# Patient Record
Sex: Male | Born: 1950 | Race: Black or African American | Hispanic: No | Marital: Married | Smoking: Never smoker
Health system: Southern US, Community
[De-identification: ages and names within clinical notes are randomized; demographics above are authoritative.]

## PROBLEM LIST (undated history)

## (undated) DIAGNOSIS — I10 Essential (primary) hypertension: Secondary | ICD-10-CM

## (undated) DIAGNOSIS — C801 Malignant (primary) neoplasm, unspecified: Secondary | ICD-10-CM

## (undated) HISTORY — PX: PROSTATE SURGERY: SHX751

## (undated) HISTORY — PX: COLONOSCOPY: SHX174

## (undated) HISTORY — DX: Malignant (primary) neoplasm, unspecified: C80.1

## (undated) HISTORY — PX: ROTATOR CUFF REPAIR: SHX139

## (undated) HISTORY — PX: KNEE SURGERY: SHX244

## (undated) HISTORY — DX: Essential (primary) hypertension: I10

---

## 1997-08-06 ENCOUNTER — Other Ambulatory Visit: Admission: RE | Admit: 1997-08-06 | Discharge: 1997-08-06 | Payer: Self-pay | Admitting: Internal Medicine

## 1999-10-14 ENCOUNTER — Encounter: Admission: RE | Admit: 1999-10-14 | Discharge: 1999-10-14 | Payer: Self-pay | Admitting: Urology

## 1999-10-14 ENCOUNTER — Encounter: Payer: Self-pay | Admitting: Urology

## 1999-10-27 ENCOUNTER — Encounter: Payer: Self-pay | Admitting: Urology

## 1999-10-27 ENCOUNTER — Encounter: Admission: RE | Admit: 1999-10-27 | Discharge: 1999-10-27 | Payer: Self-pay | Admitting: Urology

## 1999-11-06 ENCOUNTER — Encounter: Payer: Self-pay | Admitting: Urology

## 1999-11-06 ENCOUNTER — Encounter: Admission: RE | Admit: 1999-11-06 | Discharge: 1999-11-06 | Payer: Self-pay | Admitting: Urology

## 2000-07-22 ENCOUNTER — Encounter: Payer: Self-pay | Admitting: Urology

## 2000-07-26 ENCOUNTER — Ambulatory Visit (HOSPITAL_COMMUNITY): Admission: RE | Admit: 2000-07-26 | Discharge: 2000-07-26 | Payer: Self-pay | Admitting: Urology

## 2003-08-30 ENCOUNTER — Emergency Department (HOSPITAL_COMMUNITY): Admission: EM | Admit: 2003-08-30 | Discharge: 2003-08-30 | Payer: Self-pay | Admitting: Family Medicine

## 2004-06-05 ENCOUNTER — Observation Stay (HOSPITAL_COMMUNITY): Admission: AD | Admit: 2004-06-05 | Discharge: 2004-06-05 | Payer: Self-pay | Admitting: Specialist

## 2004-06-05 ENCOUNTER — Ambulatory Visit (HOSPITAL_BASED_OUTPATIENT_CLINIC_OR_DEPARTMENT_OTHER): Admission: RE | Admit: 2004-06-05 | Discharge: 2004-06-05 | Payer: Self-pay | Admitting: Specialist

## 2005-03-03 ENCOUNTER — Ambulatory Visit (HOSPITAL_COMMUNITY): Admission: RE | Admit: 2005-03-03 | Discharge: 2005-03-03 | Payer: Self-pay | Admitting: Orthopedic Surgery

## 2005-03-03 ENCOUNTER — Ambulatory Visit (HOSPITAL_BASED_OUTPATIENT_CLINIC_OR_DEPARTMENT_OTHER): Admission: RE | Admit: 2005-03-03 | Discharge: 2005-03-03 | Payer: Self-pay | Admitting: Orthopedic Surgery

## 2008-05-16 ENCOUNTER — Encounter: Payer: Self-pay | Admitting: Gastroenterology

## 2008-06-18 ENCOUNTER — Ambulatory Visit: Payer: Self-pay | Admitting: Gastroenterology

## 2008-06-27 ENCOUNTER — Ambulatory Visit: Payer: Self-pay | Admitting: Gastroenterology

## 2008-06-27 LAB — HM COLONOSCOPY

## 2008-07-02 ENCOUNTER — Telehealth: Payer: Self-pay | Admitting: Gastroenterology

## 2009-06-17 ENCOUNTER — Inpatient Hospital Stay (HOSPITAL_COMMUNITY): Admission: RE | Admit: 2009-06-17 | Discharge: 2009-06-18 | Payer: Self-pay | Admitting: Urology

## 2009-06-17 ENCOUNTER — Encounter (INDEPENDENT_AMBULATORY_CARE_PROVIDER_SITE_OTHER): Payer: Self-pay | Admitting: Urology

## 2010-04-20 ENCOUNTER — Encounter: Payer: Self-pay | Admitting: Urology

## 2010-04-24 ENCOUNTER — Encounter
Admission: RE | Admit: 2010-04-24 | Discharge: 2010-04-29 | Payer: Self-pay | Source: Home / Self Care | Attending: Internal Medicine | Admitting: Internal Medicine

## 2010-06-23 LAB — COMPREHENSIVE METABOLIC PANEL
ALT: 26 U/L (ref 0–53)
AST: 22 U/L (ref 0–37)
Albumin: 4 g/dL (ref 3.5–5.2)
Alkaline Phosphatase: 61 U/L (ref 39–117)
BUN: 12 mg/dL (ref 6–23)
CO2: 31 mEq/L (ref 19–32)
Calcium: 9.7 mg/dL (ref 8.4–10.5)
Chloride: 104 mEq/L (ref 96–112)
Creatinine, Ser: 0.95 mg/dL (ref 0.4–1.5)
GFR calc Af Amer: >60 mL/min (ref >60)
GFR calc non Af Amer: >60 mL/min (ref >60)
Glucose, Bld: 100 mg/dL — ABNORMAL HIGH (ref 70–99)
Potassium: 3.8 mEq/L (ref 3.5–5.1)
Sodium: 141 mEq/L (ref 135–145)
Total Bilirubin: 0.7 mg/dL (ref 0.3–1.2)
Total Protein: 7.2 g/dL (ref 6.0–8.3)

## 2010-06-23 LAB — CBC
HCT: 42.4 % (ref 39.0–52.0)
Hemoglobin: 14.3 g/dL (ref 13.0–17.0)
MCHC: 33.4 g/dL (ref 30.0–36.0)
MCHC: 33.7 g/dL (ref 30.0–36.0)
MCV: 85.4 fL (ref 78.0–100.0)
Platelets: 204 10*3/uL (ref 150–400)
RBC: 4.2 MIL/uL — ABNORMAL LOW (ref 4.22–5.81)
RBC: 4.97 MIL/uL (ref 4.22–5.81)
RDW: 14.1 % (ref 11.5–15.5)
WBC: 6.2 10*3/uL (ref 4.0–10.5)

## 2010-06-23 LAB — ABO/RH: ABO/RH(D): O POS

## 2010-06-23 LAB — HEMOGLOBIN AND HEMATOCRIT, BLOOD: HCT: 36.7 % — ABNORMAL LOW (ref 39.0–52.0)

## 2010-06-23 LAB — DIFFERENTIAL
Basophils Absolute: 0 10*3/uL (ref 0.0–0.1)
Basophils Relative: 0 % (ref 0–1)
Monocytes Relative: 8 % (ref 3–12)
Neutro Abs: 6.8 10*3/uL (ref 1.7–7.7)
Neutrophils Relative %: 76 % (ref 43–77)

## 2010-06-23 LAB — BASIC METABOLIC PANEL
CO2: 28 mEq/L (ref 19–32)
Calcium: 8.5 mg/dL (ref 8.4–10.5)
Creatinine, Ser: 0.73 mg/dL (ref 0.4–1.5)
GFR calc Af Amer: >60 mL/min (ref >60)

## 2010-06-23 LAB — TYPE AND SCREEN

## 2010-08-15 NOTE — Op Note (Signed)
NAMEDEVONTAYE, GROUND               ACCOUNT NO.:  [id number]   MEDICAL RECORD NO.:  [id number]          PATIENT TYPE:  AMB   LOCATION:  DSC                          FACILITY:  [facility]   PHYSICIAN:  Katy Fitch. Sypher, M.D. 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   POSTOPERATIVE DIAGNOSES:  1.  Degenerative labral tear anteriorly and superiorly with degeneration of      the anterior inferior glenoid.  2.  Residual inferior clavicle impingement with prominent anterior inferior      clavicle osteophyte status post prior open resection.   OPERATION:  1.  Diagnostic arthroscopy left glenohumeral joint with debridement of      degenerative labrum and degenerative hyaline articular cartilage of      glenoid anteriorly and inferiorly.  2.  Identification of deep surface partial-thickness supraspinatus rotator      cuff tear that was debrided to bleeding tendon surface.  3.  Subacromial decompression with coracoacromial ligament release,      acromioplasty and bursectomy.  4.  Open resection of distal clavicle with repair of deltoid and trapezius      muscles.   OPERATING SURGEON:  Katy Fitch. Sypher, M.D.   ASSISTANT:  Molly Maduro Dasnoit PA-C.   ANESTHESIA:  General endotracheal supplemented by left interscalene block.   SUPERVISING ANESTHESIOLOGIST:  Dr. Gelene Mink.   INDICATION:  Keldrick Pomplun is a 60 year old gentleman employed by [employer].   He has a history of developing left shoulder pain.   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   After a failed course of therapy and a failure to respond to nonoperative  measures, he was brought to the  operating room and had his distal left  clavicle and medial acromion resected by an orthopedic colleague.   After approximately six months of rehabilitation, he still had residual left  shoulder pain. Therefore a independent medical evaluation was requested.   Clinical examination at that time revealed signs of residual rotator cuff  disease with pain on resisted abduction and external rotation and signs of  residual anterior impingement.   Plain films suggested that there was an anterior osteophyte of the clavicle  inferiorly and there was still a rather prominent anterior lateral acromion.   An MRI the shoulder was reviewed which revealed signs of rotator cuff  tendinopathy, an indistinct the anterior labrum and prominent inferior  clavicle.   We recommended to Mr. Primmer that he strongly consider arthroscopic  evaluation of his shoulder to discern the degree of glenohumeral pathology  and to review his labrum, biceps tendon and rotator cuff.   In addition, we recommended subacromial decompression and distal clavicle  resection as well as repair of the rotator cuff if a significant tear was  identified.   After deliberation and a lengthy informed  consent, Mr. Bacallao proceeds with  surgery at this time.   PROCEDURE:  Toren Tucholski is brought to the operating room and placed in  supine position upon the table.   Dr. Gelene Mink had completed an anesthesia consultation in the holding area  including a contemporary EKG.   Dr. Gelene Mink advised Mr. Scarano to proceed with an interscalene block. This  was placed without complication followed by induction of general anesthesia.  Mr. Zinni was carefully positioned in the beach-chair position with the aid  of a torso and head holder designed for shoulder arthroscopy.   The entire left upper extremity and forequarter were prepped with DuraPrep  and draped with impervious arthroscopy drapes.   Diagnostic arthroscopy revealed intact  hyaline articular cartilage on the  humeral head in 90% of the load-bearing surface. There was grade II  chondromalacia noted superiorly adjacent to a partial-thickness rotator cuff  tear involving the entire supraspinatus. The biceps anchor was stable at the  glenoid and the biceps tendon was normal through the rotator interval. There  was a degenerative tear of the superior and anterior labrum as well as the  anterior inferior labrum. Anterior portal was created and a nerve hook was  used to palpate the anterior glenohumeral ligaments. The superior middle and  inferior glenohumeral ligaments were noted to be sound. However, there was  grade III chondromalacia on the anterior inferior glenoid. The inferior  labrum was degenerative, the posterior labrum was intact.   The anterior portal was created under direct vision for placement of a  suction shaver. The deep surface of the supraspinatus tendon was debrided of  all degenerative fibers until bleeding tendon was identified. It is my  estimate that his deep surface tear was less than 25% the thickness of the  tendon, therefore open repair in my judgment was not indicated in a 60-year-  old man.   The labrum was debrided through a stable margin and photographic  documentation completed. The arthroscope was removed from the glenohumeral  joint and placed in the subacromial space.   The bursa was noted to be hypertrophic with a reformed coracoacromial  ligament. The anterior lateral acromion was prominent. There were  fibrillation changes in the surface of the supraspinatus tendon due to  residual impingement. There was scar obliterating the site of the [facility]  resection.   After photographic documentation of the bursal pathology, the scope was  removed and we proceeded to resection of approximate 15 more mm of the  distal clavicle through a removal of the prior surgical scar, extended in an L-shaped manner towards the midline with  identification of the distal  clavicle, subperiosteal dissection and after careful identification of  landmarks resection of an additional 11-12 mm creating a total resection of  approximately 16 mm.   Care was taken to palpate the inferior surface. This is still a prominent  inferior clavicle. Therefore, a 45 degree angle oblique resection of  clavicle was completed, taking care to identify and preserve the  coracoclavicular ligaments.   This appeared to adequately decompress the supraspinatus tendon.   The anterior deltoid and trapezial muscle fibers were then gathered with two  figure-of-eight sutures of #2 FiberWire closing the dead space created by  distal clavicle resection followed by replacement of the scope in the  subacromial space followed by bursectomy release of the coracoacromial  ligament with the cutting cautery and leveling of the acromion to a type I  morphology with particular attention to the medial [facility]  joint posteriorly and  the anterior lateral lip.   The rotator cuff was debrided of all of the fibrillated zones and appeared  to be otherwise intact.   After hemostasis was achieved, the arthroscopic equipment was removed and  the portals repaired with mattress suture of 3-0 Prolene.   The skin at the site of distal clavicle resection was repaired with  subdermal sutures of 2-0 Vicryl and intradermal 3-0 Prolene with Steri-  Strips.   Mr. Yankee had an excellent interscalene block preoperatively. Marcaine was  not placed in the portals.   He was awakened from general anesthesia, placed in a sling and transferred  to the recovery room with stable vital signs.   We anticipate admission to the recovery care center for observation of his  vital signs and perioperative antibiotics. However, if his pain as well  controlled and his vital signs were stable, specifically is O2 sats are  satisfactory after a interscalene block, we may allow him early discharge  this  afternoon.      Katy Fitch Sypher, M.D.  Electronically Signed     RVS/MEDQ  D:  03/03/2005  T:  03/03/2005  Job:  [id number]

## 2011-09-24 ENCOUNTER — Other Ambulatory Visit: Payer: Self-pay | Admitting: Internal Medicine

## 2011-10-08 ENCOUNTER — Other Ambulatory Visit: Payer: Self-pay | Admitting: Internal Medicine

## 2011-10-08 ENCOUNTER — Ambulatory Visit: Payer: Self-pay | Admitting: Internal Medicine

## 2011-10-08 VITALS — BP 172/110 | HR 66 | Temp 99.0°F | Resp 16 | Ht 70.5 in | Wt 234.8 lb

## 2011-10-08 DIAGNOSIS — G8921 Chronic pain due to trauma: Secondary | ICD-10-CM | POA: Insufficient documentation

## 2011-10-08 DIAGNOSIS — E559 Vitamin D deficiency, unspecified: Secondary | ICD-10-CM | POA: Insufficient documentation

## 2011-10-08 DIAGNOSIS — N529 Male erectile dysfunction, unspecified: Secondary | ICD-10-CM

## 2011-10-08 DIAGNOSIS — I10 Essential (primary) hypertension: Secondary | ICD-10-CM | POA: Insufficient documentation

## 2011-10-08 LAB — POCT CBC
HCT, POC: 47.5 % (ref 43.5–53.7)
Lymph, poc: 1.7 (ref 0.6–3.4)
MCH, POC: 27.4 pg (ref 27–31.2)
MCHC: 31.6 g/dL — AB (ref 31.8–35.4)
MCV: 86.9 fL (ref 80–97)
POC Granulocyte: 3.6 (ref 2–6.9)
POC LYMPH PERCENT: 29.4 %L (ref 10–50)
RDW, POC: 14.6 %
WBC: 5.7 10*3/uL (ref 4.6–10.2)

## 2011-10-08 LAB — COMPREHENSIVE METABOLIC PANEL
Alkaline Phosphatase: 63 U/L (ref 39–117)
BUN: 11 mg/dL (ref 6–23)
Creat: 0.92 mg/dL (ref 0.50–1.35)
Glucose, Bld: 96 mg/dL (ref 70–99)
Total Bilirubin: 0.5 mg/dL (ref 0.3–1.2)

## 2011-10-08 MED ORDER — SILDENAFIL CITRATE 100 MG PO TABS
100.0000 mg | ORAL_TABLET | Freq: Every day | ORAL | Status: DC | PRN
Start: 2011-10-08 — End: 2012-01-05

## 2011-10-08 MED ORDER — LISINOPRIL-HYDROCHLOROTHIAZIDE 10-12.5 MG PO TABS: 1.0000 | ORAL_TABLET | Freq: Every day | ORAL | Status: DC

## 2011-10-08 MED ORDER — AMLODIPINE BESYLATE 10 MG PO TABS: 10.0000 mg | ORAL_TABLET | Freq: Every day | ORAL | Status: DC

## 2011-10-08 NOTE — Progress Notes (Signed)
  Subjective:    Patient ID: Jason Valencia, male    DOB: 07-01-1950, 61 y.o.   MRN: [id number]  HPI Out of meds Feels fine except for lots of orthopedic pain and using nsaids daily. No smoke and exercises regularly. Needs vit d level cked   Review of Systems same    Objective:   Physical Exam Normal except bp 169/99 168/98  Off meds.  labs Results for orders placed in visit on 10/08/11  POCT CBC      Component Value Range   WBC 5.7  4.6 - 10.2 K/uL   Lymph, poc 1.7  0.6 - 3.4   POC LYMPH PERCENT 29.4  10 - 50 %L   MID (cbc) 0.4  0 - 0.9   POC MID % 7.6  0 - 12 %M   POC Granulocyte 3.6  2 - 6.9   Granulocyte percent 63.0  37 - 80 %G   RBC 5.47  4.69 - 6.13 M/uL   Hemoglobin 15.0  14.1 - 18.1 g/dL   HCT, POC 95.6  21.3 - 53.7 %   MCV 86.9  80 - 97 fL   MCH, POC 27.4  27 - 31.2 pg   MCHC 31.6 (*) 31.8 - 35.4 g/dL   RDW, POC 08.6     Platelet Count, POC 220  142 - 424 K/uL   MPV 9.6  0 - 99.8 fL       Assessment & Plan:  RF meds 1 year

## 2011-10-09 LAB — VITAMIN D 25 HYDROXY (VIT D DEFICIENCY, FRACTURES): Vit D, 25-Hydroxy: 41 ng/mL (ref 30–89)

## 2011-10-13 ENCOUNTER — Encounter: Payer: Self-pay | Admitting: Family Medicine

## 2011-12-22 ENCOUNTER — Other Ambulatory Visit: Payer: Self-pay | Admitting: Specialist

## 2011-12-22 DIAGNOSIS — M4800 Spinal stenosis, site unspecified: Secondary | ICD-10-CM

## 2011-12-22 DIAGNOSIS — M545 Low back pain: Secondary | ICD-10-CM

## 2011-12-31 ENCOUNTER — Other Ambulatory Visit: Payer: Self-pay

## 2011-12-31 ENCOUNTER — Inpatient Hospital Stay
Admission: RE | Admit: 2011-12-31 | Discharge: 2011-12-31 | Payer: Self-pay | Source: Ambulatory Visit | Attending: Specialist | Admitting: Specialist

## 2012-01-04 ENCOUNTER — Telehealth: Payer: Self-pay

## 2012-01-04 DIAGNOSIS — N529 Male erectile dysfunction, unspecified: Secondary | ICD-10-CM

## 2012-01-04 NOTE — Telephone Encounter (Signed)
Pt would like Viagra prescription sent to [facility].  [phone]

## 2012-01-05 MED ORDER — SILDENAFIL CITRATE 100 MG PO TABS: 100.0000 mg | ORAL_TABLET | Freq: Every day | ORAL | Status: DC | PRN

## 2012-01-05 NOTE — Telephone Encounter (Signed)
Notified pt on VM that his request was completed and asked him to call pharmacy in the future.

## 2012-01-05 NOTE — Telephone Encounter (Signed)
Done.  Please remind the patient to call the pharmacy next time.

## 2012-01-12 ENCOUNTER — Ambulatory Visit
Admission: RE | Admit: 2012-01-12 | Discharge: 2012-01-12 | Disposition: A | Discharge status: Home / Self Care | Payer: Self-pay | Source: Ambulatory Visit | Attending: Specialist | Admitting: Specialist

## 2012-01-12 VITALS — BP 108/51 | HR 60

## 2012-01-12 DIAGNOSIS — M545 Low back pain: Secondary | ICD-10-CM

## 2012-01-12 DIAGNOSIS — M4800 Spinal stenosis, site unspecified: Secondary | ICD-10-CM

## 2012-01-12 MED ORDER — IOHEXOL 180 MG/ML  SOLN
15.0000 mL | Freq: Once | INTRAMUSCULAR | Status: AC | PRN
  Administered 2012-01-12: 15 mL via INTRATHECAL

## 2012-01-12 MED ORDER — ONDANSETRON HCL 4 MG/2ML IJ SOLN
4.0000 mg | Freq: Once | INTRAMUSCULAR | Status: AC
  Administered 2012-01-12: 4 mg via INTRAMUSCULAR

## 2012-01-12 MED ORDER — ONDANSETRON HCL 4 MG/2ML IJ SOLN: 4.0000 mg | Freq: Four times a day (QID) | INTRAMUSCULAR | Status: DC | PRN

## 2012-01-12 MED ORDER — DIAZEPAM 5 MG PO TABS
10.0000 mg | ORAL_TABLET | Freq: Once | ORAL | Status: AC
  Administered 2012-01-12: 10 mg via ORAL

## 2012-01-12 MED ORDER — MEPERIDINE HCL 100 MG/ML IJ SOLN
75.0000 mg | Freq: Once | INTRAMUSCULAR | Status: AC
  Administered 2012-01-12: 75 mg via INTRAMUSCULAR

## 2012-01-13 ENCOUNTER — Telehealth: Payer: Self-pay

## 2012-01-13 DIAGNOSIS — N529 Male erectile dysfunction, unspecified: Secondary | ICD-10-CM

## 2012-01-13 MED ORDER — SILDENAFIL CITRATE 100 MG PO TABS: 100.0000 mg | ORAL_TABLET | Freq: Every day | ORAL | Status: DC | PRN

## 2012-01-13 NOTE — Telephone Encounter (Signed)
lmom that rx was [facility] at [facility] and [facility]

## 2012-01-13 NOTE — Telephone Encounter (Signed)
Pt wanted prescription for Viagra to be [facility] into [facility] on [address].  [phone] Please call pt back to let him know when done. [phone]

## 2012-10-01 ENCOUNTER — Other Ambulatory Visit: Payer: Self-pay | Admitting: Internal Medicine

## 2012-11-26 ENCOUNTER — Other Ambulatory Visit: Payer: Self-pay | Admitting: Physician Assistant

## 2012-11-26 ENCOUNTER — Other Ambulatory Visit: Payer: Self-pay | Admitting: Internal Medicine

## 2012-11-26 NOTE — Telephone Encounter (Signed)
Patient is running out of his blood pressure meds , he has appt with dr guest in November and would like enough meds to get him through until then   [phone]

## 2012-11-28 NOTE — Telephone Encounter (Signed)
LMOM to notify pt RFs sent.

## 2013-02-13 ENCOUNTER — Encounter: Payer: Self-pay | Admitting: Internal Medicine

## 2013-02-13 ENCOUNTER — Ambulatory Visit (INDEPENDENT_AMBULATORY_CARE_PROVIDER_SITE_OTHER): Payer: Medicare Other | Admitting: Internal Medicine

## 2013-02-13 VITALS — BP 160/100 | HR 83 | Temp 97.7°F | Resp 16 | Ht 71.0 in | Wt 226.8 lb

## 2013-02-13 DIAGNOSIS — Z79899 Other long term (current) drug therapy: Secondary | ICD-10-CM

## 2013-02-13 DIAGNOSIS — Z8546 Personal history of malignant neoplasm of prostate: Secondary | ICD-10-CM | POA: Insufficient documentation

## 2013-02-13 DIAGNOSIS — Z125 Encounter for screening for malignant neoplasm of prostate: Secondary | ICD-10-CM

## 2013-02-13 DIAGNOSIS — I10 Essential (primary) hypertension: Secondary | ICD-10-CM

## 2013-02-13 DIAGNOSIS — N521 Erectile dysfunction due to diseases classified elsewhere: Secondary | ICD-10-CM

## 2013-02-13 DIAGNOSIS — Z23 Encounter for immunization: Secondary | ICD-10-CM

## 2013-02-13 DIAGNOSIS — Z0000 Encounter for general adult medical examination without abnormal findings: Secondary | ICD-10-CM

## 2013-02-13 DIAGNOSIS — C61 Malignant neoplasm of prostate: Secondary | ICD-10-CM

## 2013-02-13 LAB — CBC WITH DIFFERENTIAL/PLATELET
Basophils Absolute: 0 10*3/uL (ref 0.0–0.1)
HCT: 43.1 % (ref 39.0–52.0)
Hemoglobin: 15.1 g/dL (ref 13.0–17.0)
Lymphocytes Relative: 24 % (ref 12–46)
Monocytes Absolute: 0.4 10*3/uL (ref 0.1–1.0)
Monocytes Relative: 8 % (ref 3–12)
Neutro Abs: 4 10*3/uL (ref 1.7–7.7)
WBC: 5.9 10*3/uL (ref 4.0–10.5)

## 2013-02-13 LAB — POCT URINALYSIS DIPSTICK
Bilirubin, UA: NEGATIVE
Ketones, UA: NEGATIVE
Leukocytes, UA: NEGATIVE

## 2013-02-13 LAB — COMPREHENSIVE METABOLIC PANEL
AST: 16 U/L (ref 0–37)
Albumin: 4.5 g/dL (ref 3.5–5.2)
BUN: 15 mg/dL (ref 6–23)
Calcium: 10 mg/dL (ref 8.4–10.5)
Chloride: 101 mEq/L (ref 96–112)
Glucose, Bld: 107 mg/dL — ABNORMAL HIGH (ref 70–99)
Potassium: 3.8 mEq/L (ref 3.5–5.3)

## 2013-02-13 LAB — POCT UA - MICROSCOPIC ONLY: WBC, Ur, HPF, POC: NEGATIVE

## 2013-02-13 LAB — GLUCOSE, POCT (MANUAL RESULT ENTRY): POC Glucose: 111 mg/dl — AB (ref 70–99)

## 2013-02-13 LAB — LIPID PANEL: HDL: 55 mg/dL (ref >39)

## 2013-02-13 MED ORDER — SILDENAFIL CITRATE 100 MG PO TABS: 100.0000 mg | ORAL_TABLET | Freq: Every day | ORAL | Status: DC | PRN

## 2013-02-13 MED ORDER — LISINOPRIL-HYDROCHLOROTHIAZIDE 10-12.5 MG PO TABS: 1.0000 | ORAL_TABLET | Freq: Every day | ORAL | Status: DC

## 2013-02-13 MED ORDER — AMLODIPINE BESYLATE 10 MG PO TABS: 10.0000 mg | ORAL_TABLET | Freq: Every day | ORAL | Status: DC

## 2013-02-13 NOTE — Patient Instructions (Signed)
Calorie Counting Diet A calorie counting diet requires you to eat the number of calories that are right for you in a day. Calories are the measurement of how much energy you get from the food you eat. Eating the right amount of calories is important for staying at a healthy weight. If you eat too many calories, your body will store them as fat and you may gain weight. If you eat too few calories, you may lose weight. Counting the number of calories you eat during a day will help you know if you are eating the right amount. A Registered Dietitian can determine how many calories you need in a day. The amount of calories needed varies from person to person. If your goal is to lose weight, you will need to eat fewer calories. Losing weight can benefit you if you are overweight or have health problems such as heart disease, high blood pressure, or diabetes. If your goal is to gain weight, you will need to eat more calories. Gaining weight may be necessary if you have a certain health problem that causes your body to need more energy. TIPS Whether you are increasing or decreasing the number of calories you eat during a day, it may be hard to get used to changes in what you eat and drink. The following are tips to help you keep track of the number of calories you eat.  Measure foods at home with measuring cups. This helps you know the amount of food and number of calories you are eating.  Restaurants often serve food in amounts that are larger than 1 serving. While eating out, estimate how many servings of a food you are given. For example, a serving of cooked rice is  cup or about the size of half of a fist. Knowing serving sizes will help you be aware of how much food you are eating at restaurants.  Ask for smaller portion sizes or child-size portions at restaurants.  Plan to eat half of a meal at a restaurant. Take the rest home or share the other half with a friend.  Read the Nutrition Facts panel on  food labels for calorie content and serving size. You can find out how many servings are in a package, the size of a serving, and the number of calories each serving has.  For example, a package might contain 3 cookies. The Nutrition Facts panel on that package says that 1 serving is 1 cookie. Below that, it will say there are 3 servings in the container. The calories section of the Nutrition Facts label says there are 90 calories. This means there are 90 calories in 1 cookie (1 serving). If you eat 1 cookie you have eaten 90 calories. If you eat all 3 cookies, you have eaten 270 calories (3 servings x 90 calories = 270 calories). The list below tells you how big or small some common portion sizes are.  1 oz.........4 stacked dice.  3 oz.........Deck of cards.  1 tsp........Tip of little finger.  1 tbs........Thumb.  2 tbs........Golf ball.   cup.......Half of a fist.  1 cup........A fist. KEEP A FOOD LOG Write down every food item you eat, the amount you eat, and the number of calories in each food you eat during the day. At the end of the day, you can add up the total number of calories you have eaten. It may help to keep a list like the one below. Find out the calorie information by reading the   Nutrition Facts panel on food labels. Breakfast  Bran cereal (1 cup, 110 calories).  Fat-free milk ( cup, 45 calories). Snack  Apple (1 medium, 80 calories). Lunch  Spinach (1 cup, 20 calories).  Tomato ( medium, 20 calories).  Chicken breast strips (3 oz, 165 calories).  Shredded cheddar cheese ( cup, 110 calories).  Light Italian dressing (2 tbs, 60 calories).  Whole-wheat bread (1 slice, 80 calories).  Tub margarine (1 tsp, 35 calories).  Vegetable soup (1 cup, 160 calories). Dinner  Pork chop (3 oz, 190 calories).  Brown rice (1 cup, 215 calories).  Steamed broccoli ( cup, 20 calories).  Strawberries (1  cup, 65 calories).  Whipped cream (1 tbs, 50  calories). Daily Calorie Total: 1425 Document Released: 03/16/2005 Document Revised: 06/08/2011 Document Reviewed: 09/10/2006 ExitCare Patient Information 2014 [employer]. DASH Diet The DASH diet stands for "Dietary Approaches to Stop Hypertension." It is a healthy eating plan that has been shown to reduce high blood pressure (hypertension) in as little as 14 days, while also possibly providing other significant health benefits. These other health benefits include reducing the risk of breast cancer after menopause and reducing the risk of type 2 diabetes, heart disease, colon cancer, and stroke. Health benefits also include weight loss and slowing kidney failure in patients with chronic kidney disease.  DIET GUIDELINES  Limit salt (sodium). Your diet should contain less than 1500 mg of sodium daily.  Limit refined or processed carbohydrates. Your diet should include mostly whole grains. Desserts and added sugars should be used sparingly.  Include small amounts of heart-healthy fats. These types of fats include nuts, oils, and tub margarine. Limit saturated and trans fats. These fats have been shown to be harmful in the body. CHOOSING FOODS  The following food groups are based on a 2000 calorie diet. See your Registered Dietitian for individual calorie needs. Grains and Grain Products (6 to 8 servings daily)  Eat More Often: Whole-wheat bread, brown rice, whole-grain or wheat pasta, quinoa, popcorn without added fat or salt (air popped).  Eat Less Often: White bread, white pasta, white rice, cornbread. Vegetables (4 to 5 servings daily)  Eat More Often: Fresh, frozen, and canned vegetables. Vegetables may be raw, steamed, roasted, or grilled with a minimal amount of fat.  Eat Less Often/Avoid: Creamed or fried vegetables. Vegetables in a cheese sauce. Fruit (4 to 5 servings daily)  Eat More Often: All fresh, canned (in natural juice), or frozen fruits. Dried fruits without added sugar.  One hundred percent fruit juice ( cup [237 mL] daily).  Eat Less Often: Dried fruits with added sugar. Canned fruit in light or heavy syrup. Lean Meats, Fish, and Poultry (2 servings or less daily. One serving is 3 to 4 oz [85-114 g]).  Eat More Often: Ninety percent or leaner ground beef, tenderloin, sirloin. Round cuts of beef, chicken breast, turkey breast. All fish. Grill, bake, or broil your meat. Nothing should be fried.  Eat Less Often/Avoid: Fatty cuts of meat, turkey, or chicken leg, thigh, or wing. Fried cuts of meat or fish. Dairy (2 to 3 servings)  Eat More Often: Low-fat or fat-free milk, low-fat plain or light yogurt, reduced-fat or part-skim cheese.  Eat Less Often/Avoid: Milk (whole, 2%).Whole milk yogurt. Full-fat cheeses. Nuts, Seeds, and Legumes (4 to 5 servings per week)  Eat More Often: All without added salt.  Eat Less Often/Avoid: Salted nuts and seeds, canned beans with added salt. Fats and Sweets (limited)  Eat More Often: Vegetable   oils, tub margarines without trans fats, sugar-free gelatin. Mayonnaise and salad dressings.  Eat Less Often/Avoid: Coconut oils, palm oils, butter, stick margarine, cream, half and half, cookies, candy, pie. FOR MORE INFORMATION The Dash Diet Eating Plan: www.dashdiet.org Document Released: 03/05/2011 Document Revised: 06/08/2011 Document Reviewed: 03/05/2011 ExitCare Patient Information 2014 [employer].  

## 2013-02-13 NOTE — Progress Notes (Signed)
  Subjective:    Patient ID: Jason Valencia, male    DOB: [birth year], 62 y.o.   MRN: [id number]  HPI Doing well, prostate cancer controlled. Ran out of BP meds. Tolerates meds. Has ED and requests viagra. Colonoscopy 5 yrs neg, next one 5 yrs. Needs hdicp sticker back and knee arthritis. Mild incontinence. Review of Systems  Constitutional: Negative.   HENT: Negative.   Eyes: Negative.   Respiratory: Negative.   Cardiovascular: Negative.   Gastrointestinal: Negative.   Endocrine: Negative.   Genitourinary: Positive for dysuria and urgency.  Musculoskeletal: Positive for back pain.  Allergic/Immunologic: Negative.   Neurological: Negative.   Hematological: Negative.   Psychiatric/Behavioral: Negative.    Results for orders placed in visit on 02/13/13  GLUCOSE, POCT (MANUAL RESULT ENTRY)      Result Value Range   POC Glucose 111 (*) 70 - 99 mg/dl  POCT GLYCOSYLATED HEMOGLOBIN (HGB A1C)      Result Value Range   Hemoglobin A1C 5.4    POCT UA - MICROSCOPIC ONLY      Result Value Range   WBC, Ur, HPF, POC neg     RBC, urine, microscopic 0-2     Bacteria, U Microscopic neg     Mucus, UA neg     Epithelial cells, urine per micros 0-1     Crystals, Ur, HPF, POC neg     Casts, Ur, LPF, POC neg     Yeast, UA neg    POCT URINALYSIS DIPSTICK      Result Value Range   Color, UA yellow     Clarity, UA clear     Glucose, UA neg     Bilirubin, UA neg     Ketones, UA neg     Spec Grav, UA 1.025     Blood, UA neg     pH, UA 6.0     Protein, UA neg     Urobilinogen, UA 0.2     Nitrite, UA neg     Leukocytes, UA Negative           Objective:   Physical Exam  Vitals reviewed. Constitutional: He is oriented to person, place, and time. He appears well-developed and well-nourished.  HENT:  Head: Normocephalic.  Right Ear: External ear normal.  Left Ear: External ear normal.  Nose: Nose normal.  Mouth/Throat: Oropharynx is clear and moist.  Eyes: Conjunctivae and EOM are  normal. Pupils are equal, round, and reactive to light.  Neck: Normal range of motion. Neck supple. No JVD present. No thyromegaly present.  Cardiovascular: Normal rate, regular rhythm, normal heart sounds and intact distal pulses.   Pulmonary/Chest: Effort normal and breath sounds normal.  Abdominal: Bowel sounds are normal. There is no tenderness.  Genitourinary: Rectum normal and penis normal.  Musculoskeletal: He exhibits tenderness.  Lymphadenopathy:    He has no cervical adenopathy.  Neurological: He is alert and oriented to person, place, and time. He has normal reflexes. No cranial nerve deficit. He exhibits normal muscle tone. Coordination normal.  Psychiatric: He has a normal mood and affect. His behavior is normal. Judgment and thought content normal.          Assessment & Plan:  ED/HTN/Prostaye cancer all stable RF meds 1 yr

## 2013-02-14 LAB — TSH: TSH: 0.823 u[IU]/mL (ref 0.350–4.500)

## 2013-02-14 LAB — PSA, MEDICARE: PSA: <0.01 ng/mL (ref <=4.00)

## 2013-03-10 ENCOUNTER — Telehealth: Payer: Self-pay

## 2013-03-10 NOTE — Telephone Encounter (Signed)
Perhaps Dr Shelle Iron? Called him back, Dr Perrin Maltese is out of the office. Left message for him to call me back.

## 2013-03-10 NOTE — Telephone Encounter (Signed)
PSA results faxed for him. He is provided the number for Dr Darvin Neighbours, this is the referral he was asking about. He also asked about his back pain. He indicates Dr Shelle Iron has released him and he wants to know what Dr Perrin Maltese recommends for him, I have asked him to call Dr Shelle Iron if his pain is increasing, but he wants Dr Perrin Maltese to advise. He is aware Dr Perrin Maltese is out of town for a while, and it will be after next week before he can advise.

## 2013-03-10 NOTE — Telephone Encounter (Signed)
Thanks. I have left message for him to call me back, so I can advise.

## 2013-03-10 NOTE — Telephone Encounter (Signed)
I can see him for his back pain Saturday 12/20 opr he can see anyone sooner.

## 2013-03-10 NOTE — Telephone Encounter (Signed)
Patient states that he is having terrible back pain and he would like something to help ease the discomfort ([facility]). Patient also states that during his last visit with Dr. Perrin Maltese, he gave him the name and phone number of a doctor that can help with his back problems but the patient has lost this information and wants to know if he can get it again. Finally, patient would his PSA results sent to [facility] if possible.   [phone]

## 2013-03-16 NOTE — Telephone Encounter (Signed)
Called again, patient has not returned my calls

## 2013-03-20 ENCOUNTER — Telehealth: Payer: Self-pay

## 2013-03-20 NOTE — Telephone Encounter (Signed)
PT STATES HE AND DR GUEST HAD DISCUSSED ABOUT HIM HAVING TROUBLE SLEEPING AT NIGHT SINCE HE HAS A BAD BACK, HAD CALLED OVER A WEEK AGO AND STILL HASN'T HEARD FROM ANYONE PLEASE CALL [phone]   [facility] [address]

## 2013-03-20 NOTE — Telephone Encounter (Signed)
Because he has not returned my calls. Dr Perrin Maltese needs to see him for his back. Called again. Left message for him to return to clinic, and to call me back if he has questions.

## 2013-06-09 ENCOUNTER — Telehealth: Payer: Self-pay

## 2013-06-09 DIAGNOSIS — N529 Male erectile dysfunction, unspecified: Secondary | ICD-10-CM

## 2013-06-09 NOTE — Telephone Encounter (Signed)
Pt would like to try cialis 2mg  once a day, he did not like the side effects associated viagra. Best#  [phone]

## 2013-06-09 NOTE — Telephone Encounter (Signed)
Lm for pt to RTC for this concern. Advised walkin clinic hours.

## 2013-06-13 MED ORDER — TADALAFIL 20 MG PO TABS: 10.0000 mg | ORAL_TABLET | ORAL | Status: DC | PRN

## 2013-06-13 NOTE — Telephone Encounter (Signed)
Patient states he was just here in November CPE and doesn't understand why he needs to come in.  Patient would really like to speak with Dr. Elder Cyphers.  Advised patient I would send Dr. Elder Cyphers the message and will have him or he's nurse call.  Patient states understanding.

## 2013-06-13 NOTE — Telephone Encounter (Signed)
Sent in. Please advise patient of medication precautions.

## 2013-06-14 NOTE — Telephone Encounter (Signed)
Advised pt

## 2013-06-23 ENCOUNTER — Telehealth: Payer: Self-pay | Admitting: Family Medicine

## 2013-06-23 DIAGNOSIS — N529 Male erectile dysfunction, unspecified: Secondary | ICD-10-CM

## 2013-06-23 NOTE — Telephone Encounter (Signed)
Dr. Elder Cyphers, pt called. Says that he had wanted to try the Cialis 5mg  qd and not just prn. He has a voucher for a free month with rx and he wanted to try it qd for a month. Can we change? [facility]

## 2013-06-26 MED ORDER — TADALAFIL 5 MG PO TABS: ORAL_TABLET | ORAL | Status: DC

## 2013-06-26 NOTE — Telephone Encounter (Signed)
Pt advised.

## 2013-06-26 NOTE — Telephone Encounter (Signed)
Yes, he needs to also discuss this with his urologist

## 2013-07-13 ENCOUNTER — Ambulatory Visit: Payer: Medicare Other

## 2013-07-17 ENCOUNTER — Telehealth: Payer: Self-pay

## 2013-07-17 NOTE — Telephone Encounter (Signed)
PA was needed for cialis and was completed on covermymeds 07/13/13. Received a denial d/t drugs for ED are excluded from coverage under Medicare rules. Notified pharm.

## 2013-08-31 ENCOUNTER — Telehealth: Payer: Self-pay

## 2013-08-31 NOTE — Telephone Encounter (Signed)
Please pull chart for review

## 2013-08-31 NOTE — Telephone Encounter (Signed)
Pt saw Dr. Elder Cyphers for a workers comp case, but is in need of more of the medications; he said they were hydrocodone and methocarbamon 500mg 

## 2014-02-05 ENCOUNTER — Ambulatory Visit (INDEPENDENT_AMBULATORY_CARE_PROVIDER_SITE_OTHER): Payer: Medicare Other | Admitting: Internal Medicine

## 2014-02-05 VITALS — BP 160/102 | HR 85 | Temp 98.3°F | Resp 16 | Ht 72.0 in | Wt 225.6 lb

## 2014-02-05 DIAGNOSIS — N521 Erectile dysfunction due to diseases classified elsewhere: Secondary | ICD-10-CM

## 2014-02-05 DIAGNOSIS — Z79899 Other long term (current) drug therapy: Secondary | ICD-10-CM

## 2014-02-05 DIAGNOSIS — I10 Essential (primary) hypertension: Secondary | ICD-10-CM

## 2014-02-05 DIAGNOSIS — C61 Malignant neoplasm of prostate: Secondary | ICD-10-CM

## 2014-02-05 DIAGNOSIS — G894 Chronic pain syndrome: Secondary | ICD-10-CM

## 2014-02-05 DIAGNOSIS — M4806 Spinal stenosis, lumbar region: Secondary | ICD-10-CM

## 2014-02-05 DIAGNOSIS — Z23 Encounter for immunization: Secondary | ICD-10-CM

## 2014-02-05 DIAGNOSIS — M48061 Spinal stenosis, lumbar region without neurogenic claudication: Secondary | ICD-10-CM

## 2014-02-05 DIAGNOSIS — Z125 Encounter for screening for malignant neoplasm of prostate: Secondary | ICD-10-CM

## 2014-02-05 DIAGNOSIS — N5231 Erectile dysfunction following radical prostatectomy: Secondary | ICD-10-CM

## 2014-02-05 DIAGNOSIS — Z0000 Encounter for general adult medical examination without abnormal findings: Secondary | ICD-10-CM

## 2014-02-05 LAB — POCT URINALYSIS DIPSTICK
Bilirubin, UA: NEGATIVE
Blood, UA: NEGATIVE
Glucose, UA: NEGATIVE
Ketones, UA: NEGATIVE
Leukocytes, UA: NEGATIVE
Nitrite, UA: NEGATIVE
Protein, UA: NEGATIVE
Spec Grav, UA: 1.015
Urobilinogen, UA: 0.2
pH, UA: 5.5

## 2014-02-05 LAB — POCT CBC
Granulocyte percent: 74.1 %G (ref 37–80)
HCT, POC: 46.7 % (ref 43.5–53.7)
Hemoglobin: 15.4 g/dL (ref 14.1–18.1)
Lymph, poc: 1.5 (ref 0.6–3.4)
MCH: 27.6 pg (ref 27–31.2)
MCHC: 32.9 g/dL (ref 31.8–35.4)
MCV: 83.7 fL (ref 80–97)
MID (CBC): 0.4 (ref 0–0.9)
MPV: 8.2 fL (ref 0–99.8)
PLATELET COUNT, POC: 226 10*3/uL (ref 142–424)
POC Granulocyte: 5.5 (ref 2–6.9)
POC LYMPH %: 20.6 % (ref 10–50)
POC MID %: 5.3 %M (ref 0–12)
RBC: 5.58 M/uL (ref 4.69–6.13)
RDW, POC: 13.8 %
WBC: 7.4 10*3/uL (ref 4.6–10.2)

## 2014-02-05 LAB — POCT UA - MICROSCOPIC ONLY
BACTERIA, U MICROSCOPIC: NEGATIVE
CASTS, UR, LPF, POC: NEGATIVE
CRYSTALS, UR, HPF, POC: NEGATIVE
EPITHELIAL CELLS, URINE PER MICROSCOPY: NEGATIVE
Mucus, UA: NEGATIVE
RBC, urine, microscopic: NEGATIVE
WBC, Ur, HPF, POC: NEGATIVE
Yeast, UA: NEGATIVE

## 2014-02-05 LAB — COMPREHENSIVE METABOLIC PANEL
ALT: 13 U/L (ref 0–53)
AST: 15 U/L (ref 0–37)
Albumin: 4.4 g/dL (ref 3.5–5.2)
Alkaline Phosphatase: 74 U/L (ref 39–117)
BUN: 13 mg/dL (ref 6–23)
CO2: 28 mEq/L (ref 19–32)
Calcium: 9.5 mg/dL (ref 8.4–10.5)
Chloride: 99 mEq/L (ref 96–112)
Creat: 0.98 mg/dL (ref 0.50–1.35)
Glucose, Bld: 105 mg/dL — ABNORMAL HIGH (ref 70–99)
Potassium: 4.1 mEq/L (ref 3.5–5.3)
Sodium: 137 mEq/L (ref 135–145)
Total Bilirubin: 0.5 mg/dL (ref 0.2–1.2)
Total Protein: 7 g/dL (ref 6.0–8.3)

## 2014-02-05 MED ORDER — HYDROCOD POLST-CHLORPHEN POLST 10-8 MG/5ML PO LQCR: 5.0000 mL | Freq: Two times a day (BID) | ORAL | Status: DC | PRN

## 2014-02-05 MED ORDER — TADALAFIL 5 MG PO TABS: ORAL_TABLET | ORAL | Status: DC

## 2014-02-05 MED ORDER — AZITHROMYCIN 500 MG PO TABS: 500.0000 mg | ORAL_TABLET | Freq: Every day | ORAL | Status: DC

## 2014-02-05 MED ORDER — METHOCARBAMOL 750 MG PO TABS: 750.0000 mg | ORAL_TABLET | Freq: Four times a day (QID) | ORAL | Status: DC

## 2014-02-05 MED ORDER — LISINOPRIL-HYDROCHLOROTHIAZIDE 10-12.5 MG PO TABS: 1.0000 | ORAL_TABLET | Freq: Every day | ORAL | Status: DC

## 2014-02-05 MED ORDER — AMLODIPINE BESYLATE 10 MG PO TABS: 10.0000 mg | ORAL_TABLET | Freq: Every day | ORAL | Status: DC

## 2014-02-05 MED ORDER — HYDROCODONE-ACETAMINOPHEN 10-325 MG PO TABS: 1.0000 | ORAL_TABLET | Freq: Three times a day (TID) | ORAL | Status: DC | PRN

## 2014-02-05 NOTE — Patient Instructions (Addendum)
Spinal Stenosis Spinal stenosis is an abnormal narrowing of the canals of your spine (vertebrae). CAUSES  Spinal stenosis is caused by areas of bone pushing into the central canals of your vertebrae. This condition can be present at birth (congenital). It also may be caused by arthritic deterioration of your vertebrae (spinal degeneration).  SYMPTOMS   Pain that is generally worse with activities, particularly standing and walking.  Numbness, tingling, hot or cold sensations, weakness, or weariness in your legs.  Frequent episodes of falling.  A foot-slapping gait that leads to muscle weakness. DIAGNOSIS  Spinal stenosis is diagnosed with the use of magnetic resonance imaging (MRI) or computed tomography (CT). TREATMENT  Initial therapy for spinal stenosis focuses on the management of the pain and other symptoms associated with the condition. These therapies include:  Practicing postural changes to lessen pressure on your nerves.  Exercises to strengthen the core of your body.  Loss of excess body weight.  The use of nonsteroidal anti-inflammatory medicines to reduce swelling and inflammation in your nerves. When therapies to manage pain are not successful, surgery to treat spinal stenosis may be recommended. This surgery involves removing excess bone, which puts pressure on your nerve roots. During this surgery (laminectomy), the posterior boney arch (lamina) and excess bone around the facet joints are removed. Document Released: 06/06/2003 Document Revised: 07/31/2013 Document Reviewed: 06/24/2012 [facility] Patient Information 2015 [place], [place]. This information is not intended to replace advice given to you by your health care provider. Make sure you discuss any questions you have with your health care provider. Hypertension Hypertension, commonly called high blood pressure, is when the force of blood pumping through your arteries is too strong. Your arteries are the blood vessels  that carry blood from your heart throughout your body. A blood pressure reading consists of a higher number over a lower number, such as 110/72. The higher number (systolic) is the pressure inside your arteries when your heart pumps. The lower number (diastolic) is the pressure inside your arteries when your heart relaxes. Ideally you want your blood pressure below 120/80. Hypertension forces your heart to work harder to pump blood. Your arteries may become narrow or stiff. Having hypertension puts you at risk for heart disease, stroke, and other problems.  RISK FACTORS Some risk factors for high blood pressure are controllable. Others are not.  Risk factors you cannot control include:   Race. You may be at higher risk if you are African American.  Age. Risk increases with age.  Gender. Men are at higher risk than women before age 61 years. After age 6, women are at higher risk than men. Risk factors you can control include:  Not getting enough exercise or physical activity.  Being overweight.  Getting too much fat, sugar, calories, or salt in your diet.  Drinking too much alcohol. SIGNS AND SYMPTOMS Hypertension does not usually cause signs or symptoms. Extremely high blood pressure (hypertensive crisis) may cause headache, anxiety, shortness of breath, and nosebleed. DIAGNOSIS  To check if you have hypertension, your health care provider will measure your blood pressure while you are seated, with your arm held at the level of your heart. It should be measured at least twice using the same arm. Certain conditions can cause a difference in blood pressure between your right and left arms. A blood pressure reading that is higher than normal on one occasion does not mean that you need treatment. If one blood pressure reading is high, ask your health care provider  about having it checked again. TREATMENT  Treating high blood pressure includes making lifestyle changes and possibly taking  medicine. Living a healthy lifestyle can help lower high blood pressure. You may need to change some of your habits. Lifestyle changes may include:  Following the DASH diet. This diet is high in fruits, vegetables, and whole grains. It is low in salt, red meat, and added sugars.  Getting at least 2 hours of brisk physical activity every week.  Losing weight if necessary.  Not smoking.  Limiting alcoholic beverages.  Learning ways to reduce stress. If lifestyle changes are not enough to get your blood pressure under control, your health care provider may prescribe medicine. You may need to take more than one. Work closely with your health care provider to understand the risks and benefits. HOME CARE INSTRUCTIONS  Have your blood pressure rechecked as directed by your health care provider.   Take medicines only as directed by your health care provider. Follow the directions carefully. Blood pressure medicines must be taken as prescribed. The medicine does not work as well when you skip doses. Skipping doses also puts you at risk for problems.   Do not smoke.   Monitor your blood pressure at home as directed by your health care provider. SEEK MEDICAL CARE IF:   You think you are having a reaction to medicines taken.  You have recurrent headaches or feel dizzy.  You have swelling in your ankles.  You have trouble with your vision. SEEK IMMEDIATE MEDICAL CARE IF:  You develop a severe headache or confusion.  You have unusual weakness, numbness, or feel faint.  You have severe chest or abdominal pain.  You vomit repeatedly.  You have trouble breathing. MAKE SURE YOU:   Understand these instructions.  Will watch your condition.  Will get help right away if you are not doing well or get worse. Document Released: 03/16/2005 Document Revised: 07/31/2013 Document Reviewed: 01/06/2013 [facility] Patient Information 2015 [place], [place]. This information is not intended to  replace advice given to you by your health care provider. Make sure you discuss any questions you have with your health care provider.

## 2014-02-05 NOTE — Progress Notes (Deleted)
   Subjective:    Patient ID: Jason Valencia, male    DOB: [birth year], 63 y.o.   MRN: [id number]  HPI    Review of Systems     Objective:   Physical Exam        Assessment & Plan:

## 2014-02-05 NOTE — Progress Notes (Signed)
   Subjective:    Patient ID: Jason Valencia, male    DOB: 05-04-1950, 63 y.o.   MRN: [id number]  HPI 63 year old male here cough, headache, nasal congestion, and medication refills. Pt says he has a lot a lot of nasal congestion associated with a headache. He has had had the congestion and cough for about four days. When he coughs he produces greenish phlegm. No fever. No chills. No chest pain. No SOB. Pt frequently visits the [facility], thinks he may have picked up the illness there. His wife is now ill with similar symptoms.   Pt also needs refills on his Lisinopril and Amlodipine. Pt says this morning his BP was 147/89. During triage today his BP was 164/90. Pt has not run out of his BP medication; he has been taking it daily.  BP checked again at 1224, it was 154/100.   Pt also needs refills of his pain medication. His pain medications were originally prescribed by [facility]. They have released him and told him to get his refills from his PCP. He takes Ibuprofen 800mg , Methocarbamol 500mg , and Hydrocodone 7.5-325. Pt is requesting something stronger than the hydrocodone; says he sometimes has to take two hydrocodone's to get some relief.   Pt would like a flu shot, but will come back when he is not feeling ill.     Review of Systems  Constitutional: Negative for fever, chills and fatigue.  HENT: Positive for congestion and sinus pressure.   Respiratory: Positive for cough. Negative for shortness of breath and wheezing.   Cardiovascular: Negative for chest pain.  Neurological: Positive for headaches.       Objective:   Physical Exam   Results for orders placed or performed in visit on 02/05/14  POCT CBC  Result Value Ref Range   WBC 7.4 4.6 - 10.2 K/uL   Lymph, poc 1.5 0.6 - 3.4   POC LYMPH PERCENT 20.6 10 - 50 %L   MID (cbc) 0.4 0 - 0.9   POC MID % 5.3 0 - 12 %M   POC Granulocyte 5.5 2 - 6.9   Granulocyte percent 74.1 37 - 80 %G   RBC 5.58 4.69 - 6.13 M/uL   Hemoglobin 15.4 14.1 - 18.1 g/dL   HCT, POC 46.7 43.5 - 53.7 %   MCV 83.7 80 - 97 fL   MCH, POC 27.6 27 - 31.2 pg   MCHC 32.9 31.8 - 35.4 g/dL   RDW, POC 13.8 %   Platelet Count, POC 226 142 - 424 K/uL   MPV 8.2 0 - 99.8 fL        Assessment & Plan:

## 2014-02-09 ENCOUNTER — Telehealth: Payer: Self-pay

## 2014-02-09 DIAGNOSIS — N5231 Erectile dysfunction following radical prostatectomy: Secondary | ICD-10-CM

## 2014-02-09 DIAGNOSIS — C61 Malignant neoplasm of prostate: Secondary | ICD-10-CM

## 2014-02-09 DIAGNOSIS — Z23 Encounter for immunization: Secondary | ICD-10-CM

## 2014-02-09 DIAGNOSIS — I10 Essential (primary) hypertension: Secondary | ICD-10-CM

## 2014-02-09 DIAGNOSIS — Z125 Encounter for screening for malignant neoplasm of prostate: Secondary | ICD-10-CM

## 2014-02-09 DIAGNOSIS — N521 Erectile dysfunction due to diseases classified elsewhere: Secondary | ICD-10-CM

## 2014-02-09 DIAGNOSIS — M48061 Spinal stenosis, lumbar region without neurogenic claudication: Secondary | ICD-10-CM

## 2014-02-09 DIAGNOSIS — Z0000 Encounter for general adult medical examination without abnormal findings: Secondary | ICD-10-CM

## 2014-02-09 DIAGNOSIS — G894 Chronic pain syndrome: Secondary | ICD-10-CM

## 2014-02-09 DIAGNOSIS — Z79899 Other long term (current) drug therapy: Secondary | ICD-10-CM

## 2014-02-09 NOTE — Telephone Encounter (Signed)
1. I reviewed the last visit notes.  Given that he's never been prescribed medication for sleep, and he didn't discuss it with Dr. Elder Cyphers, I need more information before I can prescribe such medication.  When did it begin?  How often does it occur? Does he have trouble falling asleep, staying asleep or both? Or, he can wait until Dr. Elder Cyphers returns.  2. I don't see any reason in his record that would prevent him from being able to get his teeth cleaned.  Oftentimes, there is concern about elevated BP when dental care is being provided, and while his BP was indeed elevated when he was here 02/05/14, and is consistently elevated here (only one normal BP here in past 2 years), he still needs the dental care.

## 2014-02-09 NOTE — Telephone Encounter (Signed)
Pt called back and stated that we don't have to do PA for this. It is part of a WC case and he is not running it through his regular ins. Pt reported that he called back right after his appt on the 9th (I do not see any record of the call) bc he forgot to ask Dr Elder Cyphers if he could Rx something for sleep. He stated that he wakes up with back pain and can't get back to sleep. He is only sleeping 3-4 hrs a night. Also, he needs a letter written on our letterhead for his ins stating that he was here being seen for his back, and listing medications Rxd and that a referral was made to pain specialist. I am writing letter for pt and will call when ready. Can someone else address pt's req for sleep medication (pt reports he has not been rxd anything for sleep before) since Dr Elder Cyphers is out for the next week?

## 2014-02-09 NOTE — Telephone Encounter (Signed)
Pt CB and asked for a 2nd letter that he needs in order for him to get his teeth cleaned at [facility] that says pt is cleared to have his teeth cleaned. Is it OK for me to write this letter? See message below for sleep med req.

## 2014-02-09 NOTE — Telephone Encounter (Signed)
LM for rtn call. 

## 2014-02-09 NOTE — Telephone Encounter (Signed)
PA needed for methacarbamol. Pt has been taking this Rxd by ortho and Dr Dalbert Batman it. LMOM for pt CB to ask history of other muscle relaxants tried.

## 2014-02-12 MED ORDER — TADALAFIL 5 MG PO TABS: ORAL_TABLET | ORAL | Status: DC

## 2014-02-12 NOTE — Telephone Encounter (Signed)
As I understand this, it's not a sleep issue so much as a pain issue.  At his visit with Dr. Elder Cyphers on 11/09, the hydrocodone dose was increased from 7.5 mg to 10 mg, and the robaxin dose was increased from 500mg  to 750 mg.  How is that working?  Meds ordered this encounter  Medications  . tadalafil (CIALIS) 5 MG tablet    Sig: Take one tablet daily.    Dispense:  90 tablet    Refill:  0

## 2014-02-12 NOTE — Telephone Encounter (Signed)
Pt CB and reported that he has never had any trouble w/sleep in past or tried any medication for sleep before. He only has a problem because of the problems he is having with his back. It is causing his legs to go numb/hurt after sleeping for a while and it wakes him up, and then he has trouble going back to sleep. It occurs about every other night and he is able to only sleep for 3-4 hrs when it occurs. Pt reqs to try something that could help him sleep longer or be able to go back to sleep more easily if the pain or numbness wakes him.  Also pt stated that he can get a Rx for Cialis 5 mg QD filled at a [place] and pay OOP for it for a lot less than a 30 day supply down here, but they will only fill it for 90 day supplies. Pt reqs a printed Rx for Cialis be left at front desk for him to p/up and mail to [employer] [facility]. Pended.  I have written a letter stating there is nothing medically preventing pt from getting his teeth cleaned.

## 2014-02-13 NOTE — Telephone Encounter (Signed)
Pt CB and reported that the inc in pain medicine does seem to be more effective when he takes it, but he tries not to take it unless he really has to. He stated that he sleeps on his side and where his hip/thigh meets the bed, he will get a numbness/pain that wakes him up (irregardless of whether he has taken any pain med sometimes). He will get up and change positions and even if/when numbness/pain goes away, he has trouble falling back to sleep. At that point it is not pain that is keeping him awake. I asked if he has tried to take his pain medicine if awakened and he said that if he hasn't taken any before bed he has tried it, but it still doesn't make him go back to sleep right away. He does feel that it is a sleep issue once he gets awakened during the night, he can't get back to sleep.

## 2014-02-13 NOTE — Telephone Encounter (Signed)
I believe Dr Elder Cyphers needs to be involved with this problem.

## 2014-02-13 NOTE — Telephone Encounter (Signed)
LMOM to CB to advise Cialis Rx is ready for p/up along with letter. But also need to ask pt more specifics as to how new pain regime is working for pain, which seems to be the problem w/pt's ability to stay asleep?

## 2014-02-13 NOTE — Telephone Encounter (Signed)
Pt called back and LM. I tried to call him back and had to [facility] again.

## 2014-02-14 ENCOUNTER — Encounter: Payer: Self-pay | Admitting: *Deleted

## 2014-02-16 MED ORDER — CYCLOBENZAPRINE HCL 10 MG PO TABS: 10.0000 mg | ORAL_TABLET | Freq: Three times a day (TID) | ORAL | Status: DC | PRN

## 2014-02-16 NOTE — Telephone Encounter (Signed)
Flexeril 10mg  number 30, 3rfs, 1 po hs prn sleep back spasms.

## 2014-02-16 NOTE — Telephone Encounter (Signed)
LMOM for pt to CB. Explain Rx for flexeril. He can take this instead of Robaxin, or probably best to take Robaxin if needed during the day, and the flexeril Qhs which should help him sleep better.

## 2014-02-16 NOTE — Telephone Encounter (Signed)
Pt CB and I gave him info about new med. Pt verbalized understanding.   Also advised pt to bring the disability PPW he needs to have filled out into the office and he will need to pay a fee to have those filled out which could take up to a week. Pt agreed.

## 2014-02-19 ENCOUNTER — Telehealth: Payer: Self-pay | Admitting: Internal Medicine

## 2014-02-19 ENCOUNTER — Encounter: Payer: Self-pay | Admitting: Internal Medicine

## 2014-02-19 NOTE — Telephone Encounter (Signed)
Patient faxed an insurance form to be completed by Dr. Elder Cyphers. Placed in Dr. Luiz Ochoa box on 02/19/2014. Please return to disabilities upon completion.

## 2014-02-20 ENCOUNTER — Encounter: Payer: Self-pay | Admitting: Internal Medicine

## 2014-02-20 ENCOUNTER — Telehealth: Payer: Self-pay

## 2014-02-20 NOTE — Telephone Encounter (Signed)
PA needed for cyclobenzaprine. Completed on covermymeds.

## 2014-02-20 NOTE — Telephone Encounter (Signed)
Form completed by Dr. Elder Cyphers. Scanned in [facility] and faxed to patient's home.

## 2014-02-21 NOTE — Telephone Encounter (Signed)
I also  Completed a PA form for Robaxin today, and then received a denial for Flexeril. Ins wants an NSAID Rxd instead. I'm sure that Robaxin will be denied also. Called pharm to check cash price for ea and the Flexeril was $19 and the Robaxin $24. She will advise pt needs to pay OOP and will remind him to take just one or the other, but not both at the same time.

## 2014-02-23 ENCOUNTER — Telehealth: Payer: Self-pay | Admitting: *Deleted

## 2014-02-23 DIAGNOSIS — Z23 Encounter for immunization: Secondary | ICD-10-CM

## 2014-02-23 DIAGNOSIS — C61 Malignant neoplasm of prostate: Secondary | ICD-10-CM

## 2014-02-23 DIAGNOSIS — M48061 Spinal stenosis, lumbar region without neurogenic claudication: Secondary | ICD-10-CM

## 2014-02-23 DIAGNOSIS — N5231 Erectile dysfunction following radical prostatectomy: Secondary | ICD-10-CM

## 2014-02-23 DIAGNOSIS — N521 Erectile dysfunction due to diseases classified elsewhere: Secondary | ICD-10-CM

## 2014-02-23 DIAGNOSIS — Z79899 Other long term (current) drug therapy: Secondary | ICD-10-CM

## 2014-02-23 DIAGNOSIS — I10 Essential (primary) hypertension: Secondary | ICD-10-CM

## 2014-02-23 DIAGNOSIS — G894 Chronic pain syndrome: Secondary | ICD-10-CM

## 2014-02-23 DIAGNOSIS — Z125 Encounter for screening for malignant neoplasm of prostate: Secondary | ICD-10-CM

## 2014-02-23 DIAGNOSIS — Z0000 Encounter for general adult medical examination without abnormal findings: Secondary | ICD-10-CM

## 2014-02-23 NOTE — Telephone Encounter (Signed)
METHOCARBAMOL  -PHARMACY REQUEST

## 2014-02-27 ENCOUNTER — Telehealth: Payer: Self-pay

## 2014-02-27 NOTE — Telephone Encounter (Signed)
I have never talked to this pt- Jason Hurry do you happen to know what he is calling in regards to?

## 2014-02-27 NOTE — Telephone Encounter (Signed)
Patient calling because he says he received a call a week ago from Remi Deter. for a prescription for cialis and a letter for his dentist that is at pick up drawer. Called downstairs and there is not one. Patient called and didn't want to drive down here till he was sure it was here. Please call when ready best: [phone]

## 2014-02-27 NOTE — Telephone Encounter (Signed)
Checked drawer and notified pt on VM that the letter and Rx IS in the drawer ready for p/up.

## 2014-03-06 ENCOUNTER — Telehealth: Payer: Self-pay

## 2014-03-06 DIAGNOSIS — Z0271 Encounter for disability determination: Secondary | ICD-10-CM

## 2014-03-06 NOTE — Telephone Encounter (Signed)
Pt called and req'd a PT Rx so that he can get some PT for his back. I explained that we normally send a referral to the PT office and he stated that is fine and that he is going to check a couple of places and CB with name where he would like to go. We will put in order then and specify desired PT.

## 2014-03-16 ENCOUNTER — Encounter: Payer: Self-pay | Admitting: Internal Medicine

## 2014-03-29 ENCOUNTER — Other Ambulatory Visit: Payer: Self-pay | Admitting: Internal Medicine

## 2014-04-09 ENCOUNTER — Telehealth: Payer: Self-pay

## 2014-04-09 NOTE — Telephone Encounter (Signed)
Pt of Jason Valencia states he had his yearly bp check up, and usually is given a years supply of his medication, but when he picked up rx at pharmacy today it says, " No refills." Pt would like to know if this can be fixed. Please advise

## 2014-04-10 NOTE — Telephone Encounter (Signed)
LMOM to CB. Which meds? Dr Elder Cyphers wants pt to f/up w/in 6 mos of Nov OV.

## 2014-04-12 MED ORDER — LISINOPRIL-HYDROCHLOROTHIAZIDE 10-12.5 MG PO TABS: 1.0000 | ORAL_TABLET | Freq: Every day | ORAL | Status: DC

## 2014-04-12 NOTE — Telephone Encounter (Signed)
Lm for pt to call back

## 2014-04-12 NOTE — Telephone Encounter (Signed)
Pt CB and reported that the lisinopril is the Rx that was only sent in for 1 90-day RF. I advised pt that I can send in another 90 day RF, but that Dr Luiz Ochoa notes stated that he wanted him to come in for 6 mos check up. Explained why 6 mos f/up is usually needed for HTN pts and he agreed to come back and see Dr Elder Cyphers then.

## 2014-06-21 ENCOUNTER — Telehealth: Payer: Self-pay

## 2014-06-21 NOTE — Telephone Encounter (Signed)
Patient called about a referral request for physical therapy.  He said he talked to the PT office about making an appointment, and they said he would need to contact Dr. Elder Cyphers for a referral.  Please advise.  Thank you.  CB#: [phone]

## 2014-06-22 NOTE — Telephone Encounter (Signed)
Pt has not had OV since November 2015. Does pt need OV to receive referral?

## 2014-06-22 NOTE — Telephone Encounter (Signed)
Pt checking status of this request. Please advise at [phone]

## 2014-06-24 NOTE — Telephone Encounter (Signed)
Yes, rtc

## 2014-06-24 NOTE — Telephone Encounter (Signed)
LMOM to RTC. 

## 2014-07-16 ENCOUNTER — Encounter: Payer: Self-pay | Admitting: *Deleted

## 2014-09-13 ENCOUNTER — Encounter: Payer: Self-pay | Admitting: *Deleted

## 2014-11-01 ENCOUNTER — Telehealth: Payer: Self-pay

## 2014-11-01 NOTE — Telephone Encounter (Signed)
Patient called in and stated that he needs his HYDROcodone-acetaminophen ([place]) 10-325 MG per tablet refilled, he has about 8 left. His call back number is [phone]

## 2014-11-02 NOTE — Telephone Encounter (Signed)
Dr. Elder Cyphers pt.

## 2014-11-03 NOTE — Telephone Encounter (Signed)
Needs ov

## 2014-11-05 NOTE — Telephone Encounter (Signed)
Left message advising pt to come in. 

## 2014-11-07 ENCOUNTER — Encounter: Payer: Self-pay | Admitting: *Deleted

## 2014-12-11 ENCOUNTER — Ambulatory Visit (INDEPENDENT_AMBULATORY_CARE_PROVIDER_SITE_OTHER): Payer: Medicare Other | Admitting: Internal Medicine

## 2014-12-11 VITALS — BP 138/76 | HR 85 | Temp 98.0°F | Resp 16 | Ht 71.0 in | Wt 235.8 lb

## 2014-12-11 DIAGNOSIS — N5231 Erectile dysfunction following radical prostatectomy: Secondary | ICD-10-CM

## 2014-12-11 DIAGNOSIS — M549 Dorsalgia, unspecified: Secondary | ICD-10-CM

## 2014-12-11 DIAGNOSIS — I10 Essential (primary) hypertension: Secondary | ICD-10-CM

## 2014-12-11 DIAGNOSIS — Z8546 Personal history of malignant neoplasm of prostate: Secondary | ICD-10-CM

## 2014-12-11 DIAGNOSIS — G894 Chronic pain syndrome: Secondary | ICD-10-CM

## 2014-12-11 DIAGNOSIS — G8929 Other chronic pain: Secondary | ICD-10-CM

## 2014-12-11 DIAGNOSIS — Z0000 Encounter for general adult medical examination without abnormal findings: Secondary | ICD-10-CM

## 2014-12-11 DIAGNOSIS — Z7189 Other specified counseling: Secondary | ICD-10-CM

## 2014-12-11 DIAGNOSIS — M48061 Spinal stenosis, lumbar region without neurogenic claudication: Secondary | ICD-10-CM

## 2014-12-11 DIAGNOSIS — Z23 Encounter for immunization: Secondary | ICD-10-CM

## 2014-12-11 DIAGNOSIS — M4806 Spinal stenosis, lumbar region: Secondary | ICD-10-CM

## 2014-12-11 LAB — COMPREHENSIVE METABOLIC PANEL
ALK PHOS: 69 U/L (ref 40–115)
ALT: 17 U/L (ref 9–46)
AST: 20 U/L (ref 10–35)
Albumin: 4.9 g/dL (ref 3.6–5.1)
BUN: 14 mg/dL (ref 7–25)
CO2: 25 mmol/L (ref 20–31)
CREATININE: 0.83 mg/dL (ref 0.70–1.25)
Calcium: 9.8 mg/dL (ref 8.6–10.3)
Chloride: 97 mmol/L — ABNORMAL LOW (ref 98–110)
Glucose, Bld: 98 mg/dL (ref 65–99)
Potassium: 4 mmol/L (ref 3.5–5.3)
SODIUM: 138 mmol/L (ref 135–146)
TOTAL PROTEIN: 7.8 g/dL (ref 6.1–8.1)
Total Bilirubin: 0.6 mg/dL (ref 0.2–1.2)

## 2014-12-11 LAB — POCT CBC
GRANULOCYTE PERCENT: 72.2 % (ref 37–80)
HCT, POC: 49.1 % (ref 43.5–53.7)
Hemoglobin: 15 g/dL (ref 14.1–18.1)
Lymph, poc: 1.4 (ref 0.6–3.4)
MCH, POC: 25.1 pg — AB (ref 27–31.2)
MCHC: 30.6 g/dL — AB (ref 31.8–35.4)
MCV: 82.1 fL (ref 80–97)
MID (cbc): 0.2 (ref 0–0.9)
MPV: 7.9 fL (ref 0–99.8)
PLATELET COUNT, POC: 209 10*3/uL (ref 142–424)
POC Granulocyte: 4.3 (ref 2–6.9)
POC LYMPH %: 23.9 % (ref 10–50)
POC MID %: 3.9 %M (ref 0–12)
RBC: 5.98 M/uL (ref 4.69–6.13)
RDW, POC: 13.7 %
WBC: 5.9 10*3/uL (ref 4.6–10.2)

## 2014-12-11 LAB — POCT URINALYSIS DIPSTICK
Bilirubin, UA: NEGATIVE
Glucose, UA: NEGATIVE
Leukocytes, UA: NEGATIVE
NITRITE UA: NEGATIVE
PROTEIN UA: NEGATIVE
RBC UA: NEGATIVE
SPEC GRAV UA: 1.015
UROBILINOGEN UA: 0.2
pH, UA: 5

## 2014-12-11 LAB — POCT UA - MICROSCOPIC ONLY
Bacteria, U Microscopic: NEGATIVE
Casts, Ur, LPF, POC: NEGATIVE
Crystals, Ur, HPF, POC: NEGATIVE
WBC, UR, HPF, POC: NEGATIVE
YEAST UA: NEGATIVE

## 2014-12-11 LAB — LIPID PANEL
CHOLESTEROL: 166 mg/dL (ref 125–200)
HDL: 61 mg/dL (ref >=40)
LDL Cholesterol: 81 mg/dL (ref <130)
Total CHOL/HDL Ratio: 2.7 Ratio (ref <=5.0)
Triglycerides: 119 mg/dL (ref <150)
VLDL: 24 mg/dL (ref <30)

## 2014-12-11 LAB — TSH: TSH: 0.926 u[IU]/mL (ref 0.350–4.500)

## 2014-12-11 MED ORDER — SILDENAFIL CITRATE 20 MG PO TABS: 20.0000 mg | ORAL_TABLET | Freq: Three times a day (TID) | ORAL | Status: DC

## 2014-12-11 MED ORDER — HYDROCODONE-ACETAMINOPHEN 10-325 MG PO TABS: 1.0000 | ORAL_TABLET | Freq: Three times a day (TID) | ORAL | Status: DC | PRN

## 2014-12-11 MED ORDER — CYCLOBENZAPRINE HCL 10 MG PO TABS: 10.0000 mg | ORAL_TABLET | Freq: Three times a day (TID) | ORAL | Status: DC | PRN

## 2014-12-11 NOTE — Progress Notes (Signed)
Patient ID: Jason Valencia, male   DOB: [birth year], 64 y.o.   MRN: [id number]   12/11/2014 at 2:17 PM  Jason Valencia / DOB: 1951/03/13 / MRN: [id number]  Problem list reviewed and updated by me where necessary.   SUBJECTIVE  Jason Valencia is a 64 y.o. well appearing male presenting for the chief complaint of wanting CPE and med refills. He feels good for him. .     He  has a past medical history of Hypertension and Cancer.    Medications reviewed and updated by myself where necessary, and exist elsewhere in the encounter.   Jason Valencia has No Known Allergies. He  reports that he has never smoked. He has never used smokeless tobacco. He reports that he drinks about 1.2 oz of alcohol per week. He reports that he does not use illicit drugs. He  has no sexual activity history on file. The patient  has past surgical history that includes Prostate surgery.  His family history is not on file.  Review of Systems  Constitutional: Negative.  Negative for fever.  HENT: Negative.   Respiratory: Negative.  Negative for shortness of breath.   Cardiovascular: Negative for chest pain.  Gastrointestinal: Negative.  Negative for nausea.  Genitourinary: Negative.   Musculoskeletal: Positive for back pain and joint pain.  Skin: Negative.  Negative for rash.  Neurological: Positive for sensory change. Negative for dizziness and headaches.  Endo/Heme/Allergies: Negative.   Psychiatric/Behavioral: The patient has insomnia.     OBJECTIVE  His  height is 5\' 11"  (1.803 m) and weight is 235 lb 12.8 oz (106.958 kg). His oral temperature is 98 F (36.7 C). His blood pressure is 138/76 and his pulse is 85. His respiration is 16 and oxygen saturation is 98%.  The patient's body mass index is 32.9 kg/(m^2).  Physical Exam  Constitutional: He is oriented to person, place, and time. He appears well-developed and well-nourished. No distress.  HENT:  Head: Normocephalic.  Right Ear: External ear normal.  Left  Ear: External ear normal.  Nose: Nose normal.  Mouth/Throat: Oropharynx is clear and moist.  Eyes: Conjunctivae and EOM are normal. Pupils are equal, round, and reactive to light. No scleral icterus.  Neck: Normal range of motion. Neck supple. No tracheal deviation present.  Cardiovascular: Normal rate, regular rhythm, normal heart sounds and intact distal pulses.   No murmur heard. Respiratory: Effort normal and breath sounds normal. No respiratory distress. He has no wheezes. He has no rales. He exhibits no tenderness.  GI: Soft. Bowel sounds are normal. He exhibits no distension. There is no tenderness.  Genitourinary: Rectum normal and penis normal. Prostate is not enlarged and not tender.  Prostate removed for cancer  Lymphadenopathy:    He has no cervical adenopathy.  Neurological: He is alert and oriented to person, place, and time. He has normal reflexes. He exhibits normal muscle tone. Coordination normal.  Skin: No rash noted.  Psychiatric: He has a normal mood and affect. His behavior is normal. Thought content normal.  EKG normal  No results found for this or any previous visit (from the past 24 hour(s)).  ASSESSMENT & PLAN  Jason Valencia was seen today for annual exam, patient needs paperwork filled out and medication refill.  Diagnoses and all orders for this visit:  Annual physical exam -     EKG 12-Lead -     POCT CBC -     POCT urinalysis dipstick -     POCT UA -  Microscopic Only -     Comprehensive metabolic panel -     Lipid panel -     TSH -     Hepatitis C antibody  H/O prostate cancer -     POCT CBC -     POCT urinalysis dipstick -     POCT UA - Microscopic Only -     PSA  Encounter for medication review and counseling -     Comprehensive metabolic panel  Essential hypertension -     Comprehensive metabolic panel  Chronic back pain -     Ambulatory referral to Orthopedic Surgery -     HYDROcodone-acetaminophen (NORCO) 10-325 MG per tablet; Take 1 tablet  by mouth every 8 (eight) hours as needed for severe pain. -     cyclobenzaprine (FLEXERIL) 10 MG tablet; Take 1 tablet (10 mg total) by mouth 3 (three) times daily as needed. For muscle spasms and sleep.  Needs flu shot -     Flu Vaccine QUAD 36+ mos IM  Erectile dysfunction following radical prostatectomy -     Discontinue: sildenafil (REVATIO) 20 MG tablet; Take 1 tablet (20 mg total) by mouth 3 (three) times daily. -     sildenafil (REVATIO) 20 MG tablet; Take 1 tablet (20 mg total) by mouth 3 (three) times daily.  Chronic pain syndrome -     HYDROcodone-acetaminophen (NORCO) 10-325 MG per tablet; Take 1 tablet by mouth every 8 (eight) hours as needed for severe pain. -     cyclobenzaprine (FLEXERIL) 10 MG tablet; Take 1 tablet (10 mg total) by mouth 3 (three) times daily as needed. For muscle spasms and sleep.  Spinal stenosis of lumbar region -     HYDROcodone-acetaminophen (NORCO) 10-325 MG per tablet; Take 1 tablet by mouth every 8 (eight) hours as needed for severe pain. -     cyclobenzaprine (FLEXERIL) 10 MG tablet; Take 1 tablet (10 mg total) by mouth 3 (three) times daily as needed. For muscle spasms and sleep.  OK to refill all meds one year

## 2014-12-11 NOTE — Patient Instructions (Addendum)
Influenza Virus Vaccine injection What is this medicine? INFLUENZA VIRUS VACCINE (in floo EN zuh VAHY ruhs vak SEEN) helps to reduce the risk of getting influenza also known as the flu. The vaccine only helps protect you against some strains of the flu. This medicine may be used for other purposes; ask your health care provider or pharmacist if you have questions. COMMON BRAND NAME(S): Afluria, Agriflu, Fluarix, Fluarix Quadrivalent, FLUCELVAX, Flulaval, Fluvirin, Fluzone, Fluzone High-Dose, Fluzone Intradermal What should I tell my health care provider before I take this medicine? They need to know if you have any of these conditions: -bleeding disorder like hemophilia -fever or infection -Guillain-Barre syndrome or other neurological problems -immune system problems -infection with the human immunodeficiency virus (HIV) or AIDS -low blood platelet counts -multiple sclerosis -an unusual or allergic reaction to influenza virus vaccine, latex, other medicines, foods, dyes, or preservatives. Different brands of vaccines contain different allergens. Some may contain latex or eggs. Talk to your doctor about your allergies to make sure that you get the right vaccine. -pregnant or trying to get pregnant -breast-feeding How should I use this medicine? This vaccine is for injection into a muscle or under the skin. It is given by a health care professional. A copy of Vaccine Information Statements will be given before each vaccination. Read this sheet carefully each time. The sheet may change frequently. Talk to your healthcare provider to see which vaccines are right for you. Some vaccines should not be used in all age groups. Overdosage: If you think you have taken too much of this medicine contact a poison control center or emergency room at once. NOTE: This medicine is only for you. Do not share this medicine with others. What if I miss a dose? This does not apply. What may interact with this  medicine? -chemotherapy or radiation therapy -medicines that lower your immune system like etanercept, anakinra, infliximab, and adalimumab -medicines that treat or prevent blood clots like warfarin -phenytoin -steroid medicines like prednisone or cortisone -theophylline -vaccines This list may not describe all possible interactions. Give your health care provider a list of all the medicines, herbs, non-prescription drugs, or dietary supplements you use. Also tell them if you smoke, drink alcohol, or use illegal drugs. Some items may interact with your medicine. What should I watch for while using this medicine? Report any side effects that do not go away within 3 days to your doctor or health care professional. Call your health care provider if any unusual symptoms occur within 6 weeks of receiving this vaccine. You may still catch the flu, but the illness is not usually as bad. You cannot get the flu from the vaccine. The vaccine will not protect against colds or other illnesses that may cause fever. The vaccine is needed every year. What side effects may I notice from receiving this medicine? Side effects that you should report to your doctor or health care professional as soon as possible: -allergic reactions like skin rash, itching or hives, swelling of the face, lips, or tongue Side effects that usually do not require medical attention (report to your doctor or health care professional if they continue or are bothersome): -fever -headache -muscle aches and pains -pain, tenderness, redness, or swelling at the injection site -tiredness This list may not describe all possible side effects. Call your doctor for medical advice about side effects. You may report side effects to FDA at 1-800-FDA-1088. Where should I keep my medicine? The vaccine will be given by a health   care professional in a clinic, pharmacy, doctor's office, or other health care setting. You will not be given vaccine doses  to store at home. NOTE: This sheet is a summary. It may not cover all possible information. If you have questions about this medicine, talk to your doctor, pharmacist, or health care provider.  2015, Elsevier/Gold Standard. (2011-09-24 13:08:28) Sildenafil tablets (Revatio) What is this medicine? SILDENAFIL (sil DEN a fil) is used to treat pulmonary arterial hypertension. This is a serious heart and lung condition. This medicine helps to improve symptoms and quality of life. This medicine may be used for other purposes; ask your health care provider or pharmacist if you have questions. COMMON BRAND NAME(S): Revatio What should I tell my health care provider before I take this medicine? They need to know if you have any of these conditions: -anatomical deformation of the penis, Peyronie's disease, or history of priapism (painful and prolonged erection) -bleeding disorders -eye disease, vision problems -heart disease -high or low blood pressure -history of blood diseases, like sickle cell anemia or leukemia -kidney disease -liver disease -pulmonary veno-occlusive disease (PVOD) -stomach ulcer -an unusual or allergic reaction to sildenafil, other medicines, foods, dyes, or preservatives -pregnant or trying to get pregnant -breast-feeding How should I use this medicine? Take this medicine by mouth with a glass of water. Follow the directions on the prescription label. You can take it with or without food. If it upsets your stomach, take it with food. Take your doses at regular intervals about 4 to 6 hours apart. Do not take it more often than directed. Do not stop taking except on your doctor's advice. Talk to your pediatrician regarding the use of this medicine in children. This medicine is not approved for use in children. Overdosage: If you think you have taken too much of this medicine contact a poison control center or emergency room at once. NOTE: This medicine is only for you. Do not  share this medicine with others. What if I miss a dose? If you miss a dose, take it as soon as you can. If it is almost time for your next dose, take only that dose. Do not take double or extra doses. What may interact with this medicine? Do not take this medicine with any of the following medications: -cisapride -cobicistat; elvitegravir; emtricitabine; tenofovir -methscopolamine nitrate -nitrates like amyl nitrite, isosorbide dinitrate, isosorbide mononitrate, nitroglycerin -nitroprusside -other sildenafil products (Viagra) -telaprevir This medicine may also interact with the following medications: -antiviral medicines for HIV or AIDS -bosentan -certain medicines for benign prostatic hyperplasia (BPH) -certain medicines for blood pressure -certain medicines for fungal infections like ketoconazole and itraconazole -cimetidine -erythromcyin This list may not describe all possible interactions. Give your health care provider a list of all the medicines, herbs, non-prescription drugs, or dietary supplements you use. Also tell them if you smoke, drink alcohol, or use illegal drugs. Some items may interact with your medicine. What should I watch for while using this medicine? Tell your doctor or healthcare professional if your symptoms do not start to get better or if they get worse. Tell your doctor or health care professional right away if you have any change in your eyesight or hearing. You may get dizzy. Do not drive, use machinery, or do anything that needs mental alertness until you know how this medicine affects you. Do not stand or sit up quickly, especially if you are an older patient. This reduces the risk of dizzy or fainting spells. Avoid alcoholic drinks; they  can make you more dizzy. What side effects may I notice from receiving this medicine? Side effects that you should report to your doctor or health care professional as soon as possible: -allergic reactions like skin rash,  itching or hives, swelling of the face, lips, or tongue -breathing problems -changes in vision -chest pain -decreased hearing -fast, irregular heartbeat -men: prolonged or painful erection (lasting more than 4 hours) Side effects that usually do not require medical attention (report to your doctor or health care professional if they continue or are bothersome): -facial flushing -headache -nosebleed -trouble sleeping -upset stomach This list may not describe all possible side effects. Call your doctor for medical advice about side effects. You may report side effects to FDA at 1-800-FDA-1088. Where should I keep my medicine? Keep out of reach of children. Store at room temperature between 15 and 30 degrees C (59 and 86 degrees F). Throw away any unused medicine after the expiration date. NOTE: This sheet is a summary. It may not cover all possible information. If you have questions about this medicine, talk to your doctor, pharmacist, or health care provider.  2015, Elsevier/Gold Standard. (2012-02-12 19:54:47) Spinal Stenosis Spinal stenosis is an abnormal narrowing of the canals of your spine (vertebrae). CAUSES  Spinal stenosis is caused by areas of bone pushing into the central canals of your vertebrae. This condition can be present at birth (congenital). It also may be caused by arthritic deterioration of your vertebrae (spinal degeneration).  SYMPTOMS   Pain that is generally worse with activities, particularly standing and walking.  Numbness, tingling, hot or cold sensations, weakness, or weariness in your legs.  Frequent episodes of falling.  A foot-slapping gait that leads to muscle weakness. DIAGNOSIS  Spinal stenosis is diagnosed with the use of magnetic resonance imaging (MRI) or computed tomography (CT). TREATMENT  Initial therapy for spinal stenosis focuses on the management of the pain and other symptoms associated with the condition. These therapies  include:  Practicing postural changes to lessen pressure on your nerves.  Exercises to strengthen the core of your body.  Loss of excess body weight.  The use of nonsteroidal anti-inflammatory medicines to reduce swelling and inflammation in your nerves. When therapies to manage pain are not successful, surgery to treat spinal stenosis may be recommended. This surgery involves removing excess bone, which puts pressure on your nerve roots. During this surgery (laminectomy), the posterior boney arch (lamina) and excess bone around the facet joints are removed. Document Released: 06/06/2003 Document Revised: 07/31/2013 Document Reviewed: 06/24/2012 [facility] Patient Information 2015 [place], [place]. This information is not intended to replace advice given to you by your health care provider. Make sure you discuss any questions you have with your health care provider.

## 2014-12-12 ENCOUNTER — Telehealth: Payer: Self-pay

## 2014-12-12 LAB — PSA: PSA: <0.01 ng/mL (ref <=4.00)

## 2014-12-12 LAB — HEPATITIS C ANTIBODY: HCV AB: NEGATIVE

## 2014-12-12 NOTE — Telephone Encounter (Signed)
Pharm faxed req to change Rx for cyclobenzaprine stating that the ins will not cover it. Their alternatives are meloxicam, tizanidine tabs, and Lyrica.

## 2014-12-17 ENCOUNTER — Encounter: Payer: Self-pay | Admitting: Family Medicine

## 2014-12-25 ENCOUNTER — Telehealth: Payer: Self-pay

## 2014-12-25 NOTE — Telephone Encounter (Signed)
Dr. Elder Cyphers    Patient requesting cilias script.  He will come in and pick it up.    [phone]

## 2014-12-26 NOTE — Telephone Encounter (Signed)
Too soon to fill prescription.

## 2014-12-26 NOTE — Telephone Encounter (Signed)
Pt is requesting a prescription for Cialis. It appears that he has just had a prescription for Sildenafil, quantity of #60 written on 12/11/2014. Please advise

## 2014-12-26 NOTE — Telephone Encounter (Signed)
Left message for pt to call back  °

## 2014-12-27 ENCOUNTER — Other Ambulatory Visit: Payer: Self-pay | Admitting: Physician Assistant

## 2014-12-27 DIAGNOSIS — N5231 Erectile dysfunction following radical prostatectomy: Secondary | ICD-10-CM

## 2014-12-27 MED ORDER — TADALAFIL 20 MG PO TABS: 20.0000 mg | ORAL_TABLET | ORAL | Status: DC | PRN

## 2014-12-27 NOTE — Progress Notes (Signed)
Please contact patient.  I will print a prescription for patient to pick up.  He should take every 72 hours as needed.  He can bring to pharmacy and if it is too expensive, he can keep this prescription.

## 2015-01-03 ENCOUNTER — Telehealth: Payer: Self-pay

## 2015-01-03 DIAGNOSIS — N5231 Erectile dysfunction following radical prostatectomy: Secondary | ICD-10-CM

## 2015-01-03 MED ORDER — TADALAFIL 20 MG PO TABS: 10.0000 mg | ORAL_TABLET | Freq: Every day | ORAL | Status: DC | PRN

## 2015-01-03 NOTE — Telephone Encounter (Signed)
Rx placed in drawer.

## 2015-01-03 NOTE — Telephone Encounter (Signed)
Returned call and left message. Rx for cialis ready for pick up.

## 2015-01-03 NOTE — Telephone Encounter (Signed)
Patient is returning a phone call for Jason Valencia in regards to his medication. Please call! [phone]

## 2015-01-03 NOTE — Telephone Encounter (Signed)
I think you have already spoken to pt today.

## 2015-01-03 NOTE — Telephone Encounter (Signed)
I never found this Rx, can we re-print and sign. Place in nurses box.

## 2015-01-03 NOTE — Telephone Encounter (Signed)
Patient did not want his Cilas script to be sent to [facility].  He wants to pick it up in the office

## 2015-01-04 ENCOUNTER — Telehealth: Payer: Self-pay

## 2015-01-04 NOTE — Telephone Encounter (Signed)
Pt would like a CB from [place] concerning his medication. He thinks you two were not done talking. Please advise at [phone]

## 2015-01-05 NOTE — Telephone Encounter (Signed)
PT called needing RX change due to insurance coverage// Would like a call from Dr. Elder Cyphers or Ms. Brewington//    [phone]

## 2015-01-07 ENCOUNTER — Other Ambulatory Visit: Payer: Self-pay | Admitting: Internal Medicine

## 2015-01-07 DIAGNOSIS — N521 Erectile dysfunction due to diseases classified elsewhere: Secondary | ICD-10-CM

## 2015-01-07 MED ORDER — TADALAFIL 20 MG PO TABS: 20.0000 mg | ORAL_TABLET | Freq: Every day | ORAL | Status: DC | PRN

## 2015-01-07 NOTE — Telephone Encounter (Signed)
Dr. Elder Cyphers can you please advise this gentlemen as a couple of [place] APPs have tried to refill his medication and it is met with confusion on our parts, the patient's, and the insurance. Thank you.

## 2015-01-09 ENCOUNTER — Telehealth: Payer: Self-pay | Admitting: Internal Medicine

## 2015-01-09 NOTE — Telephone Encounter (Signed)
Patient dropped off a disability claim form to be completed. Patient had one completed last month however the new form he brought in just needs a new signature and date. Form has already been filled in, please sign and date and return the disability tray at checkout.   Than you!

## 2015-01-14 NOTE — Telephone Encounter (Signed)
Paperwork was scanned in and faxed to [phone] on 01/14/15

## 2015-01-15 ENCOUNTER — Telehealth: Payer: Self-pay

## 2015-01-15 NOTE — Telephone Encounter (Signed)
Form was just filled out for patient.  He had included an older one to go by.  When he came tonight to pick up the form, it only included the new one.  He needs the old one back to.  Please mail it to him at [place] [phone] His number is [phone]

## 2015-01-16 NOTE — Telephone Encounter (Signed)
Left VM for patient letting him know that his old form will be mailed to him.

## 2015-02-14 ENCOUNTER — Other Ambulatory Visit: Payer: Self-pay

## 2015-02-14 DIAGNOSIS — Z79899 Other long term (current) drug therapy: Secondary | ICD-10-CM

## 2015-02-14 DIAGNOSIS — Z0000 Encounter for general adult medical examination without abnormal findings: Secondary | ICD-10-CM

## 2015-02-14 DIAGNOSIS — I10 Essential (primary) hypertension: Secondary | ICD-10-CM

## 2015-02-14 DIAGNOSIS — C61 Malignant neoplasm of prostate: Secondary | ICD-10-CM

## 2015-02-14 DIAGNOSIS — N521 Erectile dysfunction due to diseases classified elsewhere: Secondary | ICD-10-CM

## 2015-02-14 DIAGNOSIS — Z23 Encounter for immunization: Secondary | ICD-10-CM

## 2015-02-14 DIAGNOSIS — M48061 Spinal stenosis, lumbar region without neurogenic claudication: Secondary | ICD-10-CM

## 2015-02-14 DIAGNOSIS — G894 Chronic pain syndrome: Secondary | ICD-10-CM

## 2015-02-14 DIAGNOSIS — N5231 Erectile dysfunction following radical prostatectomy: Secondary | ICD-10-CM

## 2015-02-14 DIAGNOSIS — Z125 Encounter for screening for malignant neoplasm of prostate: Secondary | ICD-10-CM

## 2015-02-14 MED ORDER — METHOCARBAMOL 750 MG PO TABS: 750.0000 mg | ORAL_TABLET | Freq: Four times a day (QID) | ORAL | Status: DC

## 2015-03-08 DIAGNOSIS — M25562 Pain in left knee: Secondary | ICD-10-CM | POA: Diagnosis not present

## 2015-03-08 DIAGNOSIS — M25561 Pain in right knee: Secondary | ICD-10-CM | POA: Diagnosis not present

## 2015-03-08 DIAGNOSIS — M5416 Radiculopathy, lumbar region: Secondary | ICD-10-CM | POA: Diagnosis not present

## 2015-03-12 ENCOUNTER — Other Ambulatory Visit: Payer: Self-pay | Admitting: Internal Medicine

## 2015-03-15 NOTE — Telephone Encounter (Signed)
Patient was seen in 9/16,  Pharmacy sending a refill request for lisinopril. Can we refill this?

## 2015-03-18 NOTE — Telephone Encounter (Signed)
Per 9/16 OV notes, OK to RF all meds 1 yr. Sent RFs. Notified pt who had called to check status.

## 2015-03-22 DIAGNOSIS — M545 Low back pain: Secondary | ICD-10-CM | POA: Diagnosis not present

## 2015-04-06 ENCOUNTER — Other Ambulatory Visit: Payer: Self-pay | Admitting: Internal Medicine

## 2015-04-13 ENCOUNTER — Telehealth: Payer: Self-pay

## 2015-04-15 DIAGNOSIS — M25562 Pain in left knee: Secondary | ICD-10-CM | POA: Diagnosis not present

## 2015-04-15 DIAGNOSIS — M4806 Spinal stenosis, lumbar region: Secondary | ICD-10-CM | POA: Diagnosis not present

## 2015-04-27 DIAGNOSIS — M25562 Pain in left knee: Secondary | ICD-10-CM | POA: Diagnosis not present

## 2015-05-03 DIAGNOSIS — M1712 Unilateral primary osteoarthritis, left knee: Secondary | ICD-10-CM | POA: Diagnosis not present

## 2015-05-06 ENCOUNTER — Other Ambulatory Visit: Payer: Self-pay | Admitting: Internal Medicine

## 2015-05-14 ENCOUNTER — Telehealth: Payer: Self-pay

## 2015-05-14 NOTE — Telephone Encounter (Signed)
Wants to know why there are no refills on his amLODipine (NORVASC) 10 MG tablet   [phone]

## 2015-05-15 MED ORDER — AMLODIPINE BESYLATE 10 MG PO TABS: 10.0000 mg | ORAL_TABLET | Freq: Every day | ORAL | Status: DC

## 2015-05-15 NOTE — Telephone Encounter (Signed)
At Sept OV, Dr Tsosie Billing RFs of all meds for 1 yr. I sent in 6 more mos of RFs and notified pt on Vm.

## 2015-06-14 DIAGNOSIS — M1712 Unilateral primary osteoarthritis, left knee: Secondary | ICD-10-CM | POA: Diagnosis not present

## 2015-06-24 DIAGNOSIS — C61 Malignant neoplasm of prostate: Secondary | ICD-10-CM | POA: Diagnosis not present

## 2015-06-24 DIAGNOSIS — N529 Male erectile dysfunction, unspecified: Secondary | ICD-10-CM | POA: Diagnosis not present

## 2015-06-24 DIAGNOSIS — N393 Stress incontinence (female) (male): Secondary | ICD-10-CM | POA: Diagnosis not present

## 2015-06-25 DIAGNOSIS — M545 Low back pain: Secondary | ICD-10-CM | POA: Diagnosis not present

## 2015-06-25 DIAGNOSIS — M25562 Pain in left knee: Secondary | ICD-10-CM | POA: Diagnosis not present

## 2015-07-02 DIAGNOSIS — M25562 Pain in left knee: Secondary | ICD-10-CM | POA: Diagnosis not present

## 2015-07-02 DIAGNOSIS — M545 Low back pain: Secondary | ICD-10-CM | POA: Diagnosis not present

## 2015-07-04 DIAGNOSIS — M25562 Pain in left knee: Secondary | ICD-10-CM | POA: Diagnosis not present

## 2015-07-04 DIAGNOSIS — M545 Low back pain: Secondary | ICD-10-CM | POA: Diagnosis not present

## 2015-07-09 DIAGNOSIS — M545 Low back pain: Secondary | ICD-10-CM | POA: Diagnosis not present

## 2015-07-09 DIAGNOSIS — M25562 Pain in left knee: Secondary | ICD-10-CM | POA: Diagnosis not present

## 2015-07-16 DIAGNOSIS — M545 Low back pain: Secondary | ICD-10-CM | POA: Diagnosis not present

## 2015-07-16 DIAGNOSIS — M25562 Pain in left knee: Secondary | ICD-10-CM | POA: Diagnosis not present

## 2015-07-18 DIAGNOSIS — M25562 Pain in left knee: Secondary | ICD-10-CM | POA: Diagnosis not present

## 2015-07-18 DIAGNOSIS — M545 Low back pain: Secondary | ICD-10-CM | POA: Diagnosis not present

## 2015-07-25 DIAGNOSIS — M545 Low back pain: Secondary | ICD-10-CM | POA: Diagnosis not present

## 2015-07-25 DIAGNOSIS — M25562 Pain in left knee: Secondary | ICD-10-CM | POA: Diagnosis not present

## 2015-07-26 DIAGNOSIS — M1712 Unilateral primary osteoarthritis, left knee: Secondary | ICD-10-CM | POA: Diagnosis not present

## 2015-08-01 DIAGNOSIS — M545 Low back pain: Secondary | ICD-10-CM | POA: Diagnosis not present

## 2015-08-01 DIAGNOSIS — M25562 Pain in left knee: Secondary | ICD-10-CM | POA: Diagnosis not present

## 2015-08-06 DIAGNOSIS — M25562 Pain in left knee: Secondary | ICD-10-CM | POA: Diagnosis not present

## 2015-08-06 DIAGNOSIS — M545 Low back pain: Secondary | ICD-10-CM | POA: Diagnosis not present

## 2015-08-08 DIAGNOSIS — M545 Low back pain: Secondary | ICD-10-CM | POA: Diagnosis not present

## 2015-08-08 DIAGNOSIS — M25562 Pain in left knee: Secondary | ICD-10-CM | POA: Diagnosis not present

## 2015-08-09 ENCOUNTER — Telehealth: Payer: Self-pay

## 2015-08-09 NOTE — Telephone Encounter (Signed)
Started a PA on methocarbamol to see if it would be covered on current plan. This has always been denied along with cyclobenzaprine, and pt pays OOP. It was evident from the form that they will not cover it for pt's Dx. Only covered for acute muscular skeletal disorders and fibromyalgia. They also req that pt tries several NSAIDS and celecoxib first. I advised pt's pharm that ins will not cover and pt should keep paying OOP.

## 2015-08-12 DIAGNOSIS — M25562 Pain in left knee: Secondary | ICD-10-CM | POA: Diagnosis not present

## 2015-08-12 DIAGNOSIS — M545 Low back pain: Secondary | ICD-10-CM | POA: Diagnosis not present

## 2015-08-14 ENCOUNTER — Telehealth: Payer: Self-pay

## 2015-08-14 DIAGNOSIS — M545 Low back pain: Secondary | ICD-10-CM | POA: Diagnosis not present

## 2015-08-14 DIAGNOSIS — M25562 Pain in left knee: Secondary | ICD-10-CM | POA: Diagnosis not present

## 2015-08-14 NOTE — Telephone Encounter (Signed)
Patient needs disability paperwork updated for his job, I have completed the forms based off his last OV and his last paperwork, I will place them in Dr. Thompson Caul box on 08/14/15 since he is a former Dr. Elder Cyphers patient. If you could sign them and return them to the FMLA/Disability desk at the [address] checkout desk within 5-7 business days. Thank you!

## 2015-08-19 DIAGNOSIS — M545 Low back pain: Secondary | ICD-10-CM | POA: Diagnosis not present

## 2015-08-19 DIAGNOSIS — M25562 Pain in left knee: Secondary | ICD-10-CM | POA: Diagnosis not present

## 2015-08-20 NOTE — Telephone Encounter (Signed)
Today is the 4th day just wantd to check the status of this paperwork, the patient has called everyday to see if it was complete yet. Thank you!

## 2015-08-22 DIAGNOSIS — M25562 Pain in left knee: Secondary | ICD-10-CM | POA: Diagnosis not present

## 2015-08-22 DIAGNOSIS — M545 Low back pain: Secondary | ICD-10-CM | POA: Diagnosis not present

## 2015-08-23 NOTE — Telephone Encounter (Signed)
Today is the 7th Day and I need these records back so I can fax them in the patient has called about 6 times to check the status of them in the past week and he is getting upset because they are not complete yet. Can you give me an idea as to when they will be done?

## 2015-08-26 NOTE — Telephone Encounter (Signed)
Form completed; to be placed on disability desk for processing.  Please advise patient when ready for pick up.

## 2015-08-27 NOTE — Telephone Encounter (Signed)
Paper work scanned and faxed to employer on 08/27/15

## 2015-09-02 ENCOUNTER — Telehealth: Payer: Self-pay

## 2015-09-02 NOTE — Telephone Encounter (Signed)
See notes under 08/09/15 message, last time I tried to get this covered for pt. Called pt and advised on Vm that his ins still will not cover this med for his Dx, and he will need to continue to pay cash for it. Asked for CB w/any further ?s. I had notified pharm of this last month and asked that they inform the pt when he checked on it.

## 2015-09-02 NOTE — Telephone Encounter (Signed)
Patient is calling because he was prescribed methocarbamol and the insurance won't cover it unless the doctor sends a approval letter. Patient was advise by the pharmacy to call us.  Insurance is [employer] Patient does not have the fax number and it is not on the back of the card scanned in. Patient phone: [phone]

## 2015-09-09 DIAGNOSIS — M545 Low back pain: Secondary | ICD-10-CM | POA: Diagnosis not present

## 2015-09-09 DIAGNOSIS — M25562 Pain in left knee: Secondary | ICD-10-CM | POA: Diagnosis not present

## 2015-09-16 DIAGNOSIS — M545 Low back pain: Secondary | ICD-10-CM | POA: Diagnosis not present

## 2015-09-16 DIAGNOSIS — M25562 Pain in left knee: Secondary | ICD-10-CM | POA: Diagnosis not present

## 2015-09-18 ENCOUNTER — Other Ambulatory Visit: Payer: Self-pay | Admitting: Internal Medicine

## 2015-09-18 NOTE — Telephone Encounter (Signed)
Patient will need to come in for a follow up visit before this prescription runs out

## 2015-09-19 DIAGNOSIS — M25562 Pain in left knee: Secondary | ICD-10-CM | POA: Diagnosis not present

## 2015-09-19 DIAGNOSIS — M545 Low back pain: Secondary | ICD-10-CM | POA: Diagnosis not present

## 2015-09-23 DIAGNOSIS — M545 Low back pain: Secondary | ICD-10-CM | POA: Diagnosis not present

## 2015-09-23 DIAGNOSIS — M25562 Pain in left knee: Secondary | ICD-10-CM | POA: Diagnosis not present

## 2015-09-26 DIAGNOSIS — M25562 Pain in left knee: Secondary | ICD-10-CM | POA: Diagnosis not present

## 2015-09-26 DIAGNOSIS — M545 Low back pain: Secondary | ICD-10-CM | POA: Diagnosis not present

## 2015-12-14 ENCOUNTER — Encounter: Payer: Self-pay | Admitting: Physician Assistant

## 2015-12-14 DIAGNOSIS — N393 Stress incontinence (female) (male): Secondary | ICD-10-CM | POA: Insufficient documentation

## 2015-12-14 DIAGNOSIS — N529 Male erectile dysfunction, unspecified: Secondary | ICD-10-CM | POA: Insufficient documentation

## 2015-12-23 ENCOUNTER — Telehealth: Payer: Self-pay

## 2015-12-23 NOTE — Telephone Encounter (Signed)
Jason Valencia - Pt has a medication question he wants to ask you and wants to discuss something Dr Elder Cyphers had told him before.  He asked specifically for you and would not go into further detail. [phone]

## 2015-12-23 NOTE — Telephone Encounter (Signed)
Called pt back and we discussed his need to est care with another provider since Dr Elder Cyphers has not been practicing here recently and not known if he will permanently retire. Gave pt names of male providers (per pt preference) and explained same day appt. Also discussed that narcotic pain meds will probably not be managed long term here unless it is just on an acute basis, but providers could refer to pain clinic, d/t stricter gov regulations. Pt stated he will call next week and set up an appt.

## 2016-01-02 ENCOUNTER — Other Ambulatory Visit: Payer: Self-pay | Admitting: Internal Medicine

## 2016-01-05 ENCOUNTER — Other Ambulatory Visit: Payer: Self-pay | Admitting: Internal Medicine

## 2016-01-08 ENCOUNTER — Ambulatory Visit: Payer: Self-pay

## 2016-01-11 ENCOUNTER — Ambulatory Visit: Payer: Self-pay

## 2016-01-11 ENCOUNTER — Ambulatory Visit (INDEPENDENT_AMBULATORY_CARE_PROVIDER_SITE_OTHER): Payer: Medicare Other | Admitting: Family Medicine

## 2016-01-11 VITALS — BP 158/84 | HR 105 | Temp 98.1°F | Resp 17 | Ht 71.0 in | Wt 232.0 lb

## 2016-01-11 DIAGNOSIS — R059 Cough, unspecified: Secondary | ICD-10-CM

## 2016-01-11 DIAGNOSIS — G8929 Other chronic pain: Secondary | ICD-10-CM | POA: Diagnosis not present

## 2016-01-11 DIAGNOSIS — R05 Cough: Secondary | ICD-10-CM | POA: Diagnosis not present

## 2016-01-11 DIAGNOSIS — M545 Low back pain: Secondary | ICD-10-CM | POA: Diagnosis not present

## 2016-01-11 DIAGNOSIS — I10 Essential (primary) hypertension: Secondary | ICD-10-CM | POA: Diagnosis not present

## 2016-01-11 LAB — BASIC METABOLIC PANEL
BUN: 13 mg/dL (ref 7–25)
CALCIUM: 9.5 mg/dL (ref 8.6–10.3)
CHLORIDE: 101 mmol/L (ref 98–110)
CO2: 26 mmol/L (ref 20–31)
CREATININE: 0.91 mg/dL (ref 0.70–1.25)
GLUCOSE: 100 mg/dL — AB (ref 65–99)
Potassium: 4.1 mmol/L (ref 3.5–5.3)
Sodium: 136 mmol/L (ref 135–146)

## 2016-01-11 MED ORDER — LOSARTAN POTASSIUM-HCTZ 50-12.5 MG PO TABS: 1.0000 | ORAL_TABLET | Freq: Every day | ORAL | 1 refills | Status: DC

## 2016-01-11 MED ORDER — HYDROCODONE-ACETAMINOPHEN 5-325 MG PO TABS: 1.0000 | ORAL_TABLET | Freq: Four times a day (QID) | ORAL | 0 refills | Status: DC | PRN

## 2016-01-11 MED ORDER — AMLODIPINE BESYLATE 10 MG PO TABS: 10.0000 mg | ORAL_TABLET | Freq: Every day | ORAL | 1 refills | Status: DC

## 2016-01-11 MED ORDER — CYCLOBENZAPRINE HCL 5 MG PO TABS: ORAL_TABLET | ORAL | 0 refills | Status: DC

## 2016-01-11 NOTE — Progress Notes (Signed)
By signing my name below I, Tereasa Coop, attest that this documentation has been prepared under the direction and in the presence of Wendie Agreste, MD. Electonically Signed. Tereasa Coop, Scribe 01/11/2016 at 1:14 PM  Subjective:    Patient ID: Jason Valencia, male    DOB: 10/11/1950, 65 y.o.   MRN: [id number]  Chief Complaint  Patient presents with  . Medication Refill    lisinopril. Pt requesting paper prescription.  . Back Pain    Chronic    HPI Jason Valencia is a 65 y.o. male who presents to the [facility] complaining of chronic back pain. Pt also requesting lisinopril prescription refill.  Last office visit at [facility] was September 2016 for a physical with Dr Elder Cyphers.  Chronic back pain Pt has been treated by Dr Elder Cyphers in the past. When Seen last, pt was referred to orthopedic surgery and was prescribed flexeril and hydrocodone. Most recent imaging in [facility], CT lumbar spine with Myelogram 2013. Spinal stenosis L4/L5, L3/L4 disc bulge, and L2/L3 disc protrusion. Pt states he has been in physical therapy at [place] after being seen by Dr Mina Marble at [facility] until June 2017. Pt was also given robaxin, which pt discontinued because it "upset" the pt's stomach. Pt states that his back pain was improved until this past week after he "threw his back out." back pain is already improving spontaneously. Pt denies any urinary or bowel incontinence, weakness, or saddle anesthesia.   HTN Treated with lisinopril, amlodipine, and HCTZ. At last visit, pt's BP was 138/76. Pt states that he has been taking his medications faithfully. Pt has been checking his BP and noting that his BP has mostly been in the 130s/80s. Pt denies any CP, SOB, lightheadedness, or dizziness.   Pt has developed a dry cough and is starting to have a mild HA. Cough has been off and on for the past year. Pt denies any nasal congestion or rhinorrhea. HA is mild and usually occurs after  he takes his lisinopril. Pt also c/o mild, occasional, "jumping" in his muscles   Pt has history of prostate cancer that was removed.   Pt ate 5 hrs ago.   Patient Active Problem List   Diagnosis Date Noted  . SUI (stress urinary incontinence), male 12/14/2015  . 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 History of prostate cancer 02/13/2013  . HTN (hypertension) 10/08/2011  . Vitamin D deficiency 10/08/2011  . Chronic pain due to trauma 10/08/2011   Past Medical History:  Diagnosis Date  . Cancer ([place])   . Hypertension    Past Surgical History:  Procedure Laterality Date  . PROSTATE SURGERY     No Known Allergies Prior to Admission medications   Medication Sig Start Date End Date Taking? Authorizing Provider  amLODipine (NORVASC) 10 MG tablet Take 1 tablet (10 mg total) by mouth daily. 05/15/15  Yes Orma Flaming, MD  chlorpheniramine-HYDROcodone [facility]) 10-8 MG/5ML LQCR Take 5 mLs by mouth every 12 (twelve) hours as needed for cough. 02/05/14  Yes Orma Flaming, MD  cyclobenzaprine (FLEXERIL) 10 MG tablet TAKE ONE TABLET BY MOUTH THREE TIMES A DAY AS NEEDED FOR MUSCLE SPASMS AND SLEEP 04/08/15  Yes Orma Flaming, MD  HYDROcodone-acetaminophen [facility]) 10-325 MG per tablet Take 1 tablet by mouth every 8 (eight) hours as needed for severe pain. 12/11/14  Yes Orma Flaming, MD  lisinopril-hydrochlorothiazide (PRINZIDE,ZESTORETIC) 10-12.5 MG tablet TAKE 1 TABLET BY MOUTH  EVERY DAY 03/18/15  Yes Orma Flaming, MD  methocarbamol (ROBAXIN-750) 750 MG tablet Take 1 tablet (750 mg total) by mouth 4 (four) times daily. 02/14/15  Yes Orma Flaming, MD  sildenafil (REVATIO) 20 MG tablet Take 1 tablet (20 mg total) by mouth 3 (three) times daily. 12/11/14  Yes Orma Flaming, MD  tadalafil (CIALIS) 20 MG tablet Take 0.5 tablets (10 mg total) by mouth daily as needed for erectile dysfunction. 01/03/15  Yes Tishira R Brewington, PA-C  tadalafil (CIALIS) 20 MG tablet Take 1 tablet (20  mg total) by mouth daily as needed for erectile dysfunction. 01/07/15  Yes Orma Flaming, MD   Social History   Social History  . Marital status: Married    Spouse name: N/A  . Number of children: N/A  . Years of education: N/A   Occupational History  . Not on file.   Social History Main Topics  . Smoking status: Never Smoker  . Smokeless tobacco: Never Used  . Alcohol use 1.2 oz/week    2 Standard drinks or equivalent per week  . Drug use: No  . Sexual activity: Not on file   Other Topics Concern  . Not on file   Social History Narrative  . No narrative on file      Review of Systems  Constitutional: Negative for fatigue, fever and unexpected weight change.  HENT: Negative for congestion.   Eyes: Negative for visual disturbance.  Respiratory: Positive for cough. Negative for chest tightness and shortness of breath.   Cardiovascular: Negative for chest pain, palpitations and leg swelling.  Gastrointestinal: Negative for abdominal pain and blood in stool.  Musculoskeletal: Positive for back pain (chronic).  Neurological: Positive for headaches. Negative for dizziness and light-headedness.       Objective:   Physical Exam  Constitutional: He is oriented to person, place, and time. He appears well-developed and well-nourished.  HENT:  Head: Normocephalic and atraumatic.  Eyes: EOM are normal. Pupils are equal, round, and reactive to light.  Neck: No JVD present. Carotid bruit is not present.  Cardiovascular: Normal rate, regular rhythm and normal heart sounds.   No murmur heard. Pulmonary/Chest: Effort normal and breath sounds normal. He has no rales.  Musculoskeletal: He exhibits no edema.  Pt has full ROM of the lumbar and thoracic spine.   Neurological: He is alert and oriented to person, place, and time. He displays no Babinski's sign on the right side. He displays no Babinski's sign on the left side.  Reflex Scores:      Achilles reflexes are 2+ on the right  side and 2+ on the left side. Able to heel and toe walk without difficulty.  Difficulty obtaining patellar reflexes, however pattellar reflexes are are equal bilat.   Skin: Skin is warm and dry.  Psychiatric: He has a normal mood and affect.  Vitals reviewed.   Vitals:   01/11/16 1149  BP: (!) 158/84  Pulse: (!) 105  Resp: 17  Temp: 98.1 F (36.7 C)  TempSrc: Oral  SpO2: 98%  Weight: 232 lb (105.2 kg)  Height: 5\' 11"  (1.803 m)         Assessment & Plan:    Jason Valencia is a 65 y.o. male Cough, Essential hypertension - Plan: amLODipine (NORVASC) 10 MG tablet, Basic metabolic panel, losartan-hydrochlorothiazide (HYZAAR) 50-12.5 MG tablet  - Try to change off ACE inhibitor to see if his dry episodic cough is from lisinopril. Start losartan HCTZ at 50/12.5  mg, but monitor home blood pressures and recheck in the next 2 weeks. May need higher dose, but reported lower readings at home.  -Check a BMP as not fasting,  plan for fasting labs at future visit  Chronic midline low back pain without sciatica - Plan: HYDROcodone-acetaminophen (NORCO/VICODIN) 5-325 MG tablet, cyclobenzaprine (FLEXERIL) 5 MG tablet  -Had been improved, with recent flare. No red flags on exam, imaging deferred.  -Flexeril daily at bedtime when necessary, restart previous exercises from physical therapy, and agreed to short term prescription of hydrocodone only if needed for more severe breakthrough pain. Discussed if refills of this medication are needed, he would need to follow-up either with pain management, or his previous orthopedic specialist to determine if epidural spinal injection or other treatments may be indicated.  Meds ordered this encounter  Medications  . amLODipine (NORVASC) 10 MG tablet    Sig: Take 1 tablet (10 mg total) by mouth daily.    Dispense:  90 tablet    Refill:  1  . HYDROcodone-acetaminophen (NORCO/VICODIN) 5-325 MG tablet    Sig: Take 1 tablet by mouth every 6 (six) hours as  needed for moderate pain.    Dispense:  20 tablet    Refill:  0  . losartan-hydrochlorothiazide (HYZAAR) 50-12.5 MG tablet    Sig: Take 1 tablet by mouth daily.    Dispense:  90 tablet    Refill:  1  . cyclobenzaprine (FLEXERIL) 5 MG tablet    Sig: 1 pill by mouth up to every 8 hours as needed. Start with one pill by mouth each bedtime as needed due to sedation    Dispense:  15 tablet    Refill:  0   Patient Instructions    We can try a change in one of your blood pressure medications to see if that affects your cough. Keep a record of your blood pressures outside of the office visit, and if they are over 160/100, return right away for change in treatment. Otherwise follow-up with me in next 2 weeks to determine if medication changes are needed.   Flexeril as needed at night for your back pain, range of motion and stretches/exercises as previously taught by physical therapy. I did write for a short course of hydrocodone if needed for breakthrough pain, but you will need to be seen for any further refills, as we may need to discuss a pain management contract, orthopaedist, or be seen by pain management. If pain is not continuing to improve by next visit - we may need to check another xray.   Return to the clinic or go to the nearest emergency room if any of your symptoms worsen or new symptoms occur.   Back Pain, Adult Back pain is very common in adults.The cause of back pain is rarely dangerous and the pain often gets better over time.The cause of your back pain may not be known. Some common causes of back pain include:  Strain of the muscles or ligaments supporting the spine.  Wear and tear (degeneration) of the spinal disks.  Arthritis.  Direct injury to the back. For many people, back pain may return. Since back pain is rarely dangerous, most people can learn to manage this condition on their own. HOME CARE INSTRUCTIONS Watch your back pain for any changes. The following  actions may help to lessen any discomfort you are feeling:  Remain active. It is stressful on your back to sit or stand in one place for long periods of  time. Do not sit, drive, or stand in one place for more than 30 minutes at a time. Take short walks on even surfaces as soon as you are able.Try to increase the length of time you walk each day.  Exercise regularly as directed by your health care provider. Exercise helps your back heal faster. It also helps avoid future injury by keeping your muscles strong and flexible.  Do not stay in bed.Resting more than 1-2 days can delay your recovery.  Pay attention to your body when you bend and lift. The most comfortable positions are those that put less stress on your recovering back. Always use proper lifting techniques, including:  Bending your knees.  Keeping the load close to your body.  Avoiding twisting.  Find a comfortable position to sleep. Use a firm mattress and lie on your side with your knees slightly bent. If you lie on your back, put a pillow under your knees.  Avoid feeling anxious or stressed.Stress increases muscle tension and can worsen back pain.It is important to recognize when you are anxious or stressed and learn ways to manage it, such as with exercise.  Take medicines only as directed by your health care provider. Over-the-counter medicines to reduce pain and inflammation are often the most helpful.Your health care provider may prescribe muscle relaxant drugs.These medicines help dull your pain so you can more quickly return to your normal activities and healthy exercise.  Apply ice to the injured area:  Put ice in a plastic bag.  Place a towel between your skin and the bag.  Leave the ice on for 20 minutes, 2-3 times a day for the first 2-3 days. After that, ice and heat may be alternated to reduce pain and spasms.  Maintain a healthy weight. Excess weight puts extra stress on your back and makes it difficult to  maintain good posture. SEEK MEDICAL CARE IF:  You have pain that is not relieved with rest or medicine.  You have increasing pain going down into the legs or buttocks.  You have pain that does not improve in one week.  You have night pain.  You lose weight.  You have a fever or chills. SEEK IMMEDIATE MEDICAL CARE IF:   You develop new bowel or bladder control problems.  You have unusual weakness or numbness in your arms or legs.  You develop nausea or vomiting.  You develop abdominal pain.  You feel faint.   This information is not intended to replace advice given to you by your health care provider. Make sure you discuss any questions you have with your health care provider.   Document Released: 03/16/2005 Document Revised: 04/06/2014 Document Reviewed: 07/18/2013 Elsevier Interactive Patient Education 2016 [employer].  Hypertension Hypertension, commonly called high blood pressure, is when the force of blood pumping through your arteries is too strong. Your arteries are the blood vessels that carry blood from your heart throughout your body. A blood pressure reading consists of a higher number over a lower number, such as 110/72. The higher number (systolic) is the pressure inside your arteries when your heart pumps. The lower number (diastolic) is the pressure inside your arteries when your heart relaxes. Ideally you want your blood pressure below 120/80. Hypertension forces your heart to work harder to pump blood. Your arteries may become narrow or stiff. Having untreated or uncontrolled hypertension can cause heart attack, stroke, kidney disease, and other problems. RISK FACTORS Some risk factors for high blood pressure are controllable. Others are  not.  Risk factors you cannot control include:   Race. You may be at higher risk if you are African American.  Age. Risk increases with age.  Gender. Men are at higher risk than women before age 64 years. After age 36,  women are at higher risk than men. Risk factors you can control include:  Not getting enough exercise or physical activity.  Being overweight.  Getting too much fat, sugar, calories, or salt in your diet.  Drinking too much alcohol. SIGNS AND SYMPTOMS Hypertension does not usually cause signs or symptoms. Extremely high blood pressure (hypertensive crisis) may cause headache, anxiety, shortness of breath, and nosebleed. DIAGNOSIS To check if you have hypertension, your health care provider will measure your blood pressure while you are seated, with your arm held at the level of your heart. It should be measured at least twice using the same arm. Certain conditions can cause a difference in blood pressure between your right and left arms. A blood pressure reading that is higher than normal on one occasion does not mean that you need treatment. If it is not clear whether you have high blood pressure, you may be asked to return on a different day to have your blood pressure checked again. Or, you may be asked to monitor your blood pressure at home for 1 or more weeks. TREATMENT Treating high blood pressure includes making lifestyle changes and possibly taking medicine. Living a healthy lifestyle can help lower high blood pressure. You may need to change some of your habits. Lifestyle changes may include:  Following the DASH diet. This diet is high in fruits, vegetables, and whole grains. It is low in salt, red meat, and added sugars.  Keep your sodium intake below 2,300 mg per day.  Getting at least 30-45 minutes of aerobic exercise at least 4 times per week.  Losing weight if necessary.  Not smoking.  Limiting alcoholic beverages.  Learning ways to reduce stress. Your health care provider may prescribe medicine if lifestyle changes are not enough to get your blood pressure under control, and if one of the following is true:  You are 24-61 years of age and your systolic blood pressure  is above 140.  You are 88 years of age or older, and your systolic blood pressure is above 150.  Your diastolic blood pressure is above 90.  You have diabetes, and your systolic blood pressure is over [id number] or your diastolic blood pressure is over 90.  You have kidney disease and your blood pressure is above 140/90.  You have heart disease and your blood pressure is above 140/90. Your personal target blood pressure may vary depending on your medical conditions, your age, and other factors. HOME CARE INSTRUCTIONS  Have your blood pressure rechecked as directed by your health care provider.   Take medicines only as directed by your health care provider. Follow the directions carefully. Blood pressure medicines must be taken as prescribed. The medicine does not work as well when you skip doses. Skipping doses also puts you at risk for problems.  Do not smoke.   Monitor your blood pressure at home as directed by your health care provider. SEEK MEDICAL CARE IF:   You think you are having a reaction to medicines taken.  You have recurrent headaches or feel dizzy.  You have swelling in your ankles.  You have trouble with your vision. SEEK IMMEDIATE MEDICAL CARE IF:  You develop a severe headache or confusion.  You have unusual  weakness, numbness, or feel faint.  You have severe chest or abdominal pain.  You vomit repeatedly.  You have trouble breathing. MAKE SURE YOU:   Understand these instructions.  Will watch your condition.  Will get help right away if you are not doing well or get worse.   This information is not intended to replace advice given to you by your health care provider. Make sure you discuss any questions you have with your health care provider.   Document Released: 03/16/2005 Document Revised: 07/31/2014 Document Reviewed: 01/06/2013 Elsevier Interactive Patient Education 2016 [employer].    IF you received an x-ray today, you will receive an  invoice from [facility]. Please contact [facility] at [phone] with questions or concerns regarding your invoice.   IF you received labwork today, you will receive an invoice from [employer]. Please contact [facility] at [phone] with questions or concerns regarding your invoice.   Our billing staff will not be able to assist you with questions regarding bills from these companies.  You will be contacted with the lab results as soon as they are available. The fastest way to get your results is to activate your My Chart account. Instructions are located on the last page of this paperwork. If you have not heard from [place] regarding the results in 2 weeks, please contact this office.        I personally performed the services described in this documentation, which was scribed in my presence. The recorded information has been reviewed and considered, and addended by me as needed.   Signed,   Merri Ray, MD [facility].  01/11/16 9:56 PM

## 2016-01-11 NOTE — Patient Instructions (Addendum)
We can try a change in one of your blood pressure medications to see if that affects your cough. Keep a record of your blood pressures outside of the office visit, and if they are over 160/100, return right away for change in treatment. Otherwise follow-up with me in next 2 weeks to determine if medication changes are needed.   Flexeril as needed at night for your back pain, range of motion and stretches/exercises as previously taught by physical therapy. I did write for a short course of hydrocodone if needed for breakthrough pain, but you will need to be seen for any further refills, as we may need to discuss a pain management contract, orthopaedist, or be seen by pain management. If pain is not continuing to improve by next visit - we may need to check another xray.   Return to the clinic or go to the nearest emergency room if any of your symptoms worsen or new symptoms occur.   Back Pain, Adult Back pain is very common in adults.The cause of back pain is rarely dangerous and the pain often gets better over time.The cause of your back pain may not be known. Some common causes of back pain include:  Strain of the muscles or ligaments supporting the spine.  Wear and tear (degeneration) of the spinal disks.  Arthritis.  Direct injury to the back. For many people, back pain may return. Since back pain is rarely dangerous, most people can learn to manage this condition on their own. HOME CARE INSTRUCTIONS Watch your back pain for any changes. The following actions may help to lessen any discomfort you are feeling:  Remain active. It is stressful on your back to sit or stand in one place for long periods of time. Do not sit, drive, or stand in one place for more than 30 minutes at a time. Take short walks on even surfaces as soon as you are able.Try to increase the length of time you walk each day.  Exercise regularly as directed by your health care provider. Exercise helps your back heal  faster. It also helps avoid future injury by keeping your muscles strong and flexible.  Do not stay in bed.Resting more than 1-2 days can delay your recovery.  Pay attention to your body when you bend and lift. The most comfortable positions are those that put less stress on your recovering back. Always use proper lifting techniques, including:  Bending your knees.  Keeping the load close to your body.  Avoiding twisting.  Find a comfortable position to sleep. Use a firm mattress and lie on your side with your knees slightly bent. If you lie on your back, put a pillow under your knees.  Avoid feeling anxious or stressed.Stress increases muscle tension and can worsen back pain.It is important to recognize when you are anxious or stressed and learn ways to manage it, such as with exercise.  Take medicines only as directed by your health care provider. Over-the-counter medicines to reduce pain and inflammation are often the most helpful.Your health care provider may prescribe muscle relaxant drugs.These medicines help dull your pain so you can more quickly return to your normal activities and healthy exercise.  Apply ice to the injured area:  Put ice in a plastic bag.  Place a towel between your skin and the bag.  Leave the ice on for 20 minutes, 2-3 times a day for the first 2-3 days. After that, ice and heat may be alternated to reduce pain and spasms.  Maintain a healthy weight. Excess weight puts extra stress on your back and makes it difficult to maintain good posture. SEEK MEDICAL CARE IF:  You have pain that is not relieved with rest or medicine.  You have increasing pain going down into the legs or buttocks.  You have pain that does not improve in one week.  You have night pain.  You lose weight.  You have a fever or chills. SEEK IMMEDIATE MEDICAL CARE IF:   You develop new bowel or bladder control problems.  You have unusual weakness or numbness in your arms or  legs.  You develop nausea or vomiting.  You develop abdominal pain.  You feel faint.   This information is not intended to replace advice given to you by your health care provider. Make sure you discuss any questions you have with your health care provider.   Document Released: 03/16/2005 Document Revised: 04/06/2014 Document Reviewed: 07/18/2013 Elsevier Interactive Patient Education 2016 [employer].  Hypertension Hypertension, commonly called high blood pressure, is when the force of blood pumping through your arteries is too strong. Your arteries are the blood vessels that carry blood from your heart throughout your body. A blood pressure reading consists of a higher number over a lower number, such as 110/72. The higher number (systolic) is the pressure inside your arteries when your heart pumps. The lower number (diastolic) is the pressure inside your arteries when your heart relaxes. Ideally you want your blood pressure below 120/80. Hypertension forces your heart to work harder to pump blood. Your arteries may become narrow or stiff. Having untreated or uncontrolled hypertension can cause heart attack, stroke, kidney disease, and other problems. RISK FACTORS Some risk factors for high blood pressure are controllable. Others are not.  Risk factors you cannot control include:   Race. You may be at higher risk if you are African American.  Age. Risk increases with age.  Gender. Men are at higher risk than women before age 42 years. After age 105, women are at higher risk than men. Risk factors you can control include:  Not getting enough exercise or physical activity.  Being overweight.  Getting too much fat, sugar, calories, or salt in your diet.  Drinking too much alcohol. SIGNS AND SYMPTOMS Hypertension does not usually cause signs or symptoms. Extremely high blood pressure (hypertensive crisis) may cause headache, anxiety, shortness of breath, and  nosebleed. DIAGNOSIS To check if you have hypertension, your health care provider will measure your blood pressure while you are seated, with your arm held at the level of your heart. It should be measured at least twice using the same arm. Certain conditions can cause a difference in blood pressure between your right and left arms. A blood pressure reading that is higher than normal on one occasion does not mean that you need treatment. If it is not clear whether you have high blood pressure, you may be asked to return on a different day to have your blood pressure checked again. Or, you may be asked to monitor your blood pressure at home for 1 or more weeks. TREATMENT Treating high blood pressure includes making lifestyle changes and possibly taking medicine. Living a healthy lifestyle can help lower high blood pressure. You may need to change some of your habits. Lifestyle changes may include:  Following the DASH diet. This diet is high in fruits, vegetables, and whole grains. It is low in salt, red meat, and added sugars.  Keep your sodium intake below 2,300  mg per day.  Getting at least 30-45 minutes of aerobic exercise at least 4 times per week.  Losing weight if necessary.  Not smoking.  Limiting alcoholic beverages.  Learning ways to reduce stress. Your health care provider may prescribe medicine if lifestyle changes are not enough to get your blood pressure under control, and if one of the following is true:  You are 34-68 years of age and your systolic blood pressure is above 140.  You are 45 years of age or older, and your systolic blood pressure is above 150.  Your diastolic blood pressure is above 90.  You have diabetes, and your systolic blood pressure is over [id number] or your diastolic blood pressure is over 90.  You have kidney disease and your blood pressure is above 140/90.  You have heart disease and your blood pressure is above 140/90. Your personal target blood  pressure may vary depending on your medical conditions, your age, and other factors. HOME CARE INSTRUCTIONS  Have your blood pressure rechecked as directed by your health care provider.   Take medicines only as directed by your health care provider. Follow the directions carefully. Blood pressure medicines must be taken as prescribed. The medicine does not work as well when you skip doses. Skipping doses also puts you at risk for problems.  Do not smoke.   Monitor your blood pressure at home as directed by your health care provider. SEEK MEDICAL CARE IF:   You think you are having a reaction to medicines taken.  You have recurrent headaches or feel dizzy.  You have swelling in your ankles.  You have trouble with your vision. SEEK IMMEDIATE MEDICAL CARE IF:  You develop a severe headache or confusion.  You have unusual weakness, numbness, or feel faint.  You have severe chest or abdominal pain.  You vomit repeatedly.  You have trouble breathing. MAKE SURE YOU:   Understand these instructions.  Will watch your condition.  Will get help right away if you are not doing well or get worse.   This information is not intended to replace advice given to you by your health care provider. Make sure you discuss any questions you have with your health care provider.   Document Released: 03/16/2005 Document Revised: 07/31/2014 Document Reviewed: 01/06/2013 Elsevier Interactive Patient Education 2016 [employer].    IF you received an x-ray today, you will receive an invoice from [facility]. Please contact [facility] at [phone] with questions or concerns regarding your invoice.   IF you received labwork today, you will receive an invoice from [employer]. Please contact [facility] at [phone] with questions or concerns regarding your invoice.   Our billing staff will not be able to assist you with questions regarding bills  from these companies.  You will be contacted with the lab results as soon as they are available. The fastest way to get your results is to activate your My Chart account. Instructions are located on the last page of this paperwork. If you have not heard from [place] regarding the results in 2 weeks, please contact this office.

## 2016-01-20 ENCOUNTER — Encounter: Payer: Self-pay | Admitting: *Deleted

## 2016-02-19 ENCOUNTER — Telehealth: Payer: Self-pay | Admitting: Radiology

## 2016-02-19 ENCOUNTER — Ambulatory Visit (INDEPENDENT_AMBULATORY_CARE_PROVIDER_SITE_OTHER): Payer: Medicare Other | Admitting: Physician Assistant

## 2016-02-19 ENCOUNTER — Telehealth: Payer: Self-pay

## 2016-02-19 VITALS — BP 152/90 | HR 72 | Temp 98.8°F | Resp 16 | Ht 71.0 in | Wt 239.0 lb

## 2016-02-19 DIAGNOSIS — R030 Elevated blood-pressure reading, without diagnosis of hypertension: Secondary | ICD-10-CM | POA: Diagnosis not present

## 2016-02-19 DIAGNOSIS — Z23 Encounter for immunization: Secondary | ICD-10-CM | POA: Diagnosis not present

## 2016-02-19 DIAGNOSIS — Z79899 Other long term (current) drug therapy: Secondary | ICD-10-CM

## 2016-02-19 DIAGNOSIS — I10 Essential (primary) hypertension: Secondary | ICD-10-CM | POA: Diagnosis not present

## 2016-02-19 MED ORDER — LOSARTAN POTASSIUM-HCTZ 100-12.5 MG PO TABS: 1.0000 | ORAL_TABLET | Freq: Every day | ORAL | 0 refills | Status: DC

## 2016-02-19 NOTE — Telephone Encounter (Signed)
Pt was seen today and needs meds to go to [facility] on [address] not [facility] and also needs it to be a 90 day supply  Best number [phone]

## 2016-02-19 NOTE — Progress Notes (Signed)
Jason Valencia  MRN: [id number] DOB: [birth year]  PCP: No primary care provider on file.  Subjective:  Pt is a pleasant 65 year old male who presents to clinic for blood pressure recheck.  He was last here one month ago for blood pressure medication refill. At that visit, he reported a dry cough x one year and was changed from Lisinopril and HCTZ to Hyzaar 50-12.5. He also takes Amlodipine 10mg .  Today's blood pressure is 152/90.   After his last visit, he reports cough is gone. He started Hyzaar for a week and reports increased blood pressure "around 180's". He stopped Hyzaar and started back on Lisinopril. When his Lisinopril was finished, he went back on Hyzaar, which he has been taking for about 10 days. Reports no side effects, feeling well today. Denies chest pain, headaches, vision changes, palpitations, abdominal pain, leg swelling.  He works out every day.    Review of Systems  Constitutional: Negative for chills and diaphoresis.  Respiratory: Negative for cough, chest tightness, shortness of breath and wheezing.   Cardiovascular: Negative for chest pain, palpitations and leg swelling.  Gastrointestinal: Negative for abdominal pain, constipation, diarrhea, nausea and vomiting.  Neurological: Negative for dizziness, syncope, light-headedness and headaches.    Patient Active Problem List   Diagnosis Date Noted  . SUI (stress urinary incontinence), male 12/14/2015  . 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 History of prostate cancer 02/13/2013  . HTN (hypertension) 10/08/2011  . Vitamin D deficiency 10/08/2011  . Chronic pain due to trauma 10/08/2011    Current Outpatient Prescriptions on File Prior to Visit  Medication Sig Dispense Refill  . amLODipine (NORVASC) 10 MG tablet Take 1 tablet (10 mg total) by mouth daily. 90 tablet 1  . cyclobenzaprine (FLEXERIL) 5 MG tablet 1 pill by mouth up to every 8 hours as needed. Start with one pill by mouth each bedtime as needed due  to sedation 15 tablet 0  . HYDROcodone-acetaminophen (NORCO/VICODIN) 5-325 MG tablet Take 1 tablet by mouth every 6 (six) hours as needed for moderate pain. 20 tablet 0  . losartan-hydrochlorothiazide (HYZAAR) 50-12.5 MG tablet Take 1 tablet by mouth daily. 90 tablet 1  . methocarbamol (ROBAXIN-750) 750 MG tablet Take 1 tablet (750 mg total) by mouth 4 (four) times daily. 40 tablet 3  . chlorpheniramine-HYDROcodone (TUSSIONEX PENNKINETIC ER) 10-8 MG/5ML LQCR Take 5 mLs by mouth every 12 (twelve) hours as needed for cough. (Patient not taking: Reported on 02/19/2016) 140 mL 0  . sildenafil (REVATIO) 20 MG tablet Take 1 tablet (20 mg total) by mouth 3 (three) times daily. 60 tablet 6   No current facility-administered medications on file prior to visit.     No Known Allergies   Objective:  BP (!) 152/90   Pulse 72   Temp 98.8 F (37.1 C) (Oral)   Resp 16   Ht 5\' 11"  (1.803 m)   Wt 239 lb (108.4 kg)   SpO2 98%   BMI 33.33 kg/m   Physical Exam  Constitutional: He is oriented to person, place, and time and well-developed, well-nourished, and in no distress. No distress.  Cardiovascular: Normal rate, regular rhythm and normal heart sounds.   Pulmonary/Chest: Effort normal. No respiratory distress.  Neurological: He is alert and oriented to person, place, and time. GCS score is 15.  Skin: Skin is warm and dry.  Psychiatric: Mood, memory, affect and judgment normal.  Vitals reviewed.   Assessment and Plan :  1. Essential hypertension 2.  Elevated blood pressure reading 3. Encounter for medication management - losartan-hydrochlorothiazide (HYZAAR) 100-12.5 MG tablet; Take 1 tablet by mouth daily.  Dispense: 60 tablet; Refill: 0 - Will increase Hyzaar to 100-12.5 mg qd. Requested RTC in the next 2-3 weeks for blood pressure recheck. He needs an annual exam, told him it is ok to recheck at annual.  - Asked patient to return for annual while fasting, needs BMP.  - Discussed low blood  pressure symptoms and to call or RTC if this happens.   4. Needs flu shot - Flu Vaccine QUAD 36+ mos IM   Mercer Pod, PA-C  [facility] 02/19/2016 12:30 PM

## 2016-02-19 NOTE — Patient Instructions (Addendum)
  Please start taking Hyzaar 100-12.5 once daily. We need to monitor your improvement and how you are feeling, so please schedule a follow-up appointment within the next month. As discussed, this can be done in conjunction with your annual exam.   It was a pleasure meeting you. Thank you for coming in today. I hope you feel we met your needs.  Feel free to call [facility] if you have any questions or further requests.  Please consider signing up for MyChart if you do not already have it, as this is a great way to communicate with me.  Best,  Whitney McVey, PA-C   IF you received an x-ray today, you will receive an invoice from [facility]. Please contact [facility] at [phone] with questions or concerns regarding your invoice.   IF you received labwork today, you will receive an invoice from [employer]. Please contact [facility] at [phone] with questions or concerns regarding your invoice.   Our billing staff will not be able to assist you with questions regarding bills from these companies.  You will be contacted with the lab results as soon as they are available. The fastest way to get your results is to activate your My Chart account. Instructions are located on the last page of this paperwork. If you have not heard from [place] regarding the results in 2 weeks, please contact this office.

## 2016-02-24 ENCOUNTER — Ambulatory Visit (INDEPENDENT_AMBULATORY_CARE_PROVIDER_SITE_OTHER): Payer: Medicare Other | Admitting: Physician Assistant

## 2016-02-24 VITALS — BP 180/90 | HR 105 | Temp 98.6°F | Resp 17 | Ht 71.0 in | Wt 239.0 lb

## 2016-02-24 DIAGNOSIS — I10 Essential (primary) hypertension: Secondary | ICD-10-CM | POA: Diagnosis not present

## 2016-02-24 DIAGNOSIS — R030 Elevated blood-pressure reading, without diagnosis of hypertension: Secondary | ICD-10-CM

## 2016-02-24 DIAGNOSIS — Z79899 Other long term (current) drug therapy: Secondary | ICD-10-CM | POA: Diagnosis not present

## 2016-02-24 MED ORDER — LISINOPRIL-HYDROCHLOROTHIAZIDE 20-12.5 MG PO TABS
1.0000 | ORAL_TABLET | Freq: Every day | ORAL | 3 refills | Status: DC
Start: 2016-02-24 — End: 2017-08-20

## 2016-02-24 NOTE — Progress Notes (Signed)
Jason Valencia  MRN: [id number] DOB: September 21, 1950  PCP: No primary care provider on file.  Subjective:  Pt is a pleasant 65 year old male who presents to clinic for blood pressure check. This is his second visit in two months for the same. Two months ago he was switched from Lisinopril/HCTZ to Hyzaar due to chronic dry cough. Since that time he reports elevated blood pressure. At his last visit the dose of Hyzaar was increased. Today he reports nausea and vomiting since the dose increase. He stopped taking Hyzaar a few days ago and would like to switch back to Lisinopril/HCTX.  His blood pressure today is 180/90. He is also taking Amlodipine 10mg  at night.  Denies headache, nausea, vomiting, chest pain, palpitations.   Review of Systems  Constitutional: Negative for chills, diaphoresis and fever.  Respiratory: Negative for cough, chest tightness, shortness of breath and wheezing.   Cardiovascular: Negative for chest pain, palpitations and leg swelling.  Gastrointestinal: Negative for diarrhea, nausea and vomiting.  Musculoskeletal: Negative for neck pain.  Neurological: Negative for dizziness, syncope, light-headedness and headaches.  Psychiatric/Behavioral: Negative for sleep disturbance. The patient is not nervous/anxious.     Patient Active Problem List   Diagnosis Date Noted  . SUI (stress urinary incontinence), male 12/14/2015  . 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 History of prostate cancer 02/13/2013  . HTN (hypertension) 10/08/2011  . Vitamin D deficiency 10/08/2011  . Chronic pain due to trauma 10/08/2011    Current Outpatient Prescriptions on File Prior to Visit  Medication Sig Dispense Refill  . amLODipine (NORVASC) 10 MG tablet Take 1 tablet (10 mg total) by mouth daily. 90 tablet 1  . cyclobenzaprine (FLEXERIL) 5 MG tablet 1 pill by mouth up to every 8 hours as needed. Start with one pill by mouth each bedtime as needed due to sedation 15 tablet 0  .  HYDROcodone-acetaminophen (NORCO/VICODIN) 5-325 MG tablet Take 1 tablet by mouth every 6 (six) hours as needed for moderate pain. 20 tablet 0  . losartan-hydrochlorothiazide (HYZAAR) 100-12.5 MG tablet Take 1 tablet by mouth daily. 90 tablet 0  . losartan-hydrochlorothiazide (HYZAAR) 50-12.5 MG tablet Take 1 tablet by mouth daily. 90 tablet 1  . methocarbamol (ROBAXIN-750) 750 MG tablet Take 1 tablet (750 mg total) by mouth 4 (four) times daily. 40 tablet 3  . sildenafil (REVATIO) 20 MG tablet Take 1 tablet (20 mg total) by mouth 3 (three) times daily. 60 tablet 6   No current facility-administered medications on file prior to visit.     No Known Allergies   Objective:  BP (!) 180/90 (BP Location: Right Arm, Patient Position: Sitting, Cuff Size: Large)   Pulse (!) 105   Temp 98.6 F (37 C) (Oral)   Resp 17   Ht 5\' 11"  (1.803 m)   Wt 239 lb (108.4 kg)   SpO2 98%   BMI 33.33 kg/m   Physical Exam  Constitutional: He is oriented to person, place, and time and well-developed, well-nourished, and in no distress. No distress.  Cardiovascular: Normal rate, regular rhythm and normal heart sounds.   Pulmonary/Chest: Effort normal. No respiratory distress.  Neurological: He is alert and oriented to person, place, and time. GCS score is 15.  Skin: Skin is warm and dry.  Psychiatric: Mood, memory, affect and judgment normal.  Vitals reviewed.   Assessment and Plan :  1. Essential hypertension 2. Elevated blood pressure reading 3. Encounter for medication management - lisinopril-hydrochlorothiazide (ZESTORETIC) 20-12.5 MG tablet; Take  1 tablet by mouth daily.  Dispense: 90 tablet; Refill: 3 - This patient has had uncontrolled hypertension for the past few months due to ineffective medication and noncompliance. He in unsatisfied with Norvasc. Will d/c Norvasc and restart Zestoretic at a dose of 20-12.5mg  qd. RTC in one month for blood pressure check.   Mercer Pod, PA-C  [facility] 02/24/2016 6:36 PM

## 2016-02-24 NOTE — Patient Instructions (Signed)
     IF you received an x-ray today, you will receive an invoice from [facility]. Please contact [facility] at [phone] with questions or concerns regarding your invoice.   IF you received labwork today, you will receive an invoice from [facility]. Please contact [facility] at [phone] with questions or concerns regarding your invoice.   Our billing staff will not be able to assist you with questions regarding bills from these companies.  You will be contacted with the lab results as soon as they are available. The fastest way to get your results is to activate your My Chart account. Instructions are located on the last page of this paperwork. If you have not heard from us regarding the results in 2 weeks, please contact this office.      

## 2016-03-10 ENCOUNTER — Ambulatory Visit (INDEPENDENT_AMBULATORY_CARE_PROVIDER_SITE_OTHER): Payer: Medicare Other | Admitting: Physician Assistant

## 2016-03-10 VITALS — BP 160/104 | HR 92 | Temp 97.6°F | Resp 16 | Ht 71.0 in | Wt 239.0 lb

## 2016-03-10 DIAGNOSIS — Z79899 Other long term (current) drug therapy: Secondary | ICD-10-CM

## 2016-03-10 DIAGNOSIS — I10 Essential (primary) hypertension: Secondary | ICD-10-CM

## 2016-03-10 DIAGNOSIS — R030 Elevated blood-pressure reading, without diagnosis of hypertension: Secondary | ICD-10-CM

## 2016-03-10 NOTE — Patient Instructions (Signed)
     IF you received an x-ray today, you will receive an invoice from [facility]. Please contact [facility] at [phone] with questions or concerns regarding your invoice.   IF you received labwork today, you will receive an invoice from [facility]. Please contact [facility] at [phone] with questions or concerns regarding your invoice.   Our billing staff will not be able to assist you with questions regarding bills from these companies.  You will be contacted with the lab results as soon as they are available. The fastest way to get your results is to activate your My Chart account. Instructions are located on the last page of this paperwork. If you have not heard from us regarding the results in 2 weeks, please contact this office.      

## 2016-03-10 NOTE — Progress Notes (Signed)
Jason Valencia  MRN: [id number] DOB: 02-Dec-1950  PCP: No primary care provider on file.  Subjective:  Pt is a pleasant 65 year old male who presents to clinic for follow-up blood pressure check. This is his third visit in two months for the same.  He was switched from Lisinopril/HCTZ to Hyzaar two months ago due to dry cough. He reported nausea and increased blood pressure while taking Hyzaar. He was non-compliant due to undesirable side-effects.  At his last appointment 11/27, his blood pressure was 180/90 and his medication was changed back to Lisinopril/HCTZ 20-12.5 mg at his request. Reports occasional mild headaches.  He is currently taking Zestoretic 20-12.5 mg qd and Amlodipine 10mg  q HS. Today's blood pressure is 168/96. Reports no adverse side effects.  Takes blood pressure at home every morning when he wakes up. Reports reading of 150/88.  He has a weight loss goal of 20 lbs for 2018. He does cardio daily and lifts weights twice a week at the [facility]. He is thinking about starting with a trainer.  Is inquiring about Safflower supplement for weight loss.     Review of Systems  Constitutional: Negative for chills and diaphoresis.  Respiratory: Negative for cough, chest tightness, shortness of breath and wheezing.   Cardiovascular: Negative for chest pain, palpitations and leg swelling.  Gastrointestinal: Negative for diarrhea, nausea and vomiting.  Musculoskeletal: Negative for neck pain.  Neurological: Positive for headaches. Negative for dizziness, syncope and light-headedness.  Psychiatric/Behavioral: Negative for sleep disturbance. The patient is not nervous/anxious.     Patient Active Problem List   Diagnosis Date Noted  . SUI (stress urinary incontinence), male 12/14/2015  . 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 History of prostate cancer 02/13/2013  . HTN (hypertension) 10/08/2011  . Vitamin D deficiency 10/08/2011  . Chronic pain due to trauma 10/08/2011     Current Outpatient Prescriptions on File Prior to Visit  Medication Sig Dispense Refill  . cyclobenzaprine (FLEXERIL) 5 MG tablet 1 pill by mouth up to every 8 hours as needed. Start with one pill by mouth each bedtime as needed due to sedation 15 tablet 0  . HYDROcodone-acetaminophen (NORCO/VICODIN) 5-325 MG tablet Take 1 tablet by mouth every 6 (six) hours as needed for moderate pain. 20 tablet 0  . lisinopril-hydrochlorothiazide (ZESTORETIC) 20-12.5 MG tablet Take 1 tablet by mouth daily. 90 tablet 3  . methocarbamol (ROBAXIN-750) 750 MG tablet Take 1 tablet (750 mg total) by mouth 4 (four) times daily. 40 tablet 3  . sildenafil (REVATIO) 20 MG tablet Take 1 tablet (20 mg total) by mouth 3 (three) times daily. 60 tablet 6   No current facility-administered medications on file prior to visit.     No Known Allergies   Objective:  BP (!) 168/96 (BP Location: Right Arm, Patient Position: Sitting, Cuff Size: Large)   Pulse 92   Temp 97.6 F (36.4 C) (Oral)   Resp 16   Ht 5\' 11"  (1.803 m)   Wt 239 lb (108.4 kg)   SpO2 98%   BMI 33.33 kg/m   Physical Exam  Constitutional: He is oriented to person, place, and time and well-developed, well-nourished, and in no distress. No distress.  Cardiovascular: Normal rate, regular rhythm and normal heart sounds.   Pulmonary/Chest: Effort normal. No respiratory distress.  Neurological: He is alert and oriented to person, place, and time. GCS score is 15.  Skin: Skin is warm and dry.  Psychiatric: Mood, memory, affect and judgment normal.  Vitals  reviewed.   Assessment and Plan :  1. Encounter for medication management 2. Essential hypertension 3. Elevated blood pressure reading - Start taking Zestoretic 20-12.5 mg bid. D.c amlodipine for now. Will consider adding it at his next visit if blood pressure is not controlled. Continue taking home blood pressure readings.  F/u in 1-2 weeks for recheck. He understands and agrees with plan.     Mercer Pod, PA-C  [facility] 03/10/2016 8:48 AM

## 2016-06-01 ENCOUNTER — Encounter: Payer: Medicare Other | Admitting: Physician Assistant

## 2016-06-08 ENCOUNTER — Encounter: Payer: Medicare Other | Admitting: Physician Assistant

## 2016-06-22 ENCOUNTER — Ambulatory Visit (INDEPENDENT_AMBULATORY_CARE_PROVIDER_SITE_OTHER): Payer: Medicare Other | Admitting: Physician Assistant

## 2016-06-22 VITALS — BP 160/98 | HR 110 | Temp 98.2°F | Resp 16 | Ht 71.0 in | Wt 238.4 lb

## 2016-06-22 DIAGNOSIS — Z1329 Encounter for screening for other suspected endocrine disorder: Secondary | ICD-10-CM | POA: Diagnosis not present

## 2016-06-22 DIAGNOSIS — R030 Elevated blood-pressure reading, without diagnosis of hypertension: Secondary | ICD-10-CM | POA: Diagnosis not present

## 2016-06-22 DIAGNOSIS — Z131 Encounter for screening for diabetes mellitus: Secondary | ICD-10-CM

## 2016-06-22 DIAGNOSIS — Z114 Encounter for screening for human immunodeficiency virus [HIV]: Secondary | ICD-10-CM | POA: Diagnosis not present

## 2016-06-22 DIAGNOSIS — Z13220 Encounter for screening for lipoid disorders: Secondary | ICD-10-CM

## 2016-06-22 DIAGNOSIS — Z0000 Encounter for general adult medical examination without abnormal findings: Secondary | ICD-10-CM | POA: Diagnosis not present

## 2016-06-22 DIAGNOSIS — Z8546 Personal history of malignant neoplasm of prostate: Secondary | ICD-10-CM

## 2016-06-22 DIAGNOSIS — Z130 Encounter for screening for diseases of the blood and blood-forming organs and certain disorders involving the immune mechanism: Secondary | ICD-10-CM | POA: Diagnosis not present

## 2016-06-22 DIAGNOSIS — I10 Essential (primary) hypertension: Secondary | ICD-10-CM

## 2016-06-22 LAB — POCT URINALYSIS DIP (MANUAL ENTRY)
Bilirubin, UA: NEGATIVE
Blood, UA: NEGATIVE
Glucose, UA: NEGATIVE
Ketones, POC UA: NEGATIVE
Leukocytes, UA: NEGATIVE
Nitrite, UA: NEGATIVE
Protein Ur, POC: NEGATIVE
Spec Grav, UA: 1.015 (ref 1.030–1.035)
Urobilinogen, UA: 0.2 (ref ?–2.0)
pH, UA: 6.5 (ref 5.0–8.0)

## 2016-06-22 LAB — POC MICROSCOPIC URINALYSIS (UMFC): Mucus: ABSENT

## 2016-06-22 MED ORDER — LISINOPRIL-HYDROCHLOROTHIAZIDE 20-25 MG PO TABS: 1.0000 | ORAL_TABLET | Freq: Every day | ORAL | 3 refills | Status: DC

## 2016-06-22 MED ORDER — AMLODIPINE BESYLATE 10 MG PO TABS: 10.0000 mg | ORAL_TABLET | Freq: Every day | ORAL | 3 refills | Status: DC

## 2016-06-22 NOTE — Patient Instructions (Addendum)
We are increasing your dose of Zestoretic to 20-47m (2 tablets daily) - you can take these at the same time. Also take your Amlodipine 154monce daily. Take a few blood pressures at home and write them down - bring these with you at your next appt. Come back and see me in one month for blood pressure check.  Melatonin for sleep. You can take 0.3-5 mg at bedtime.   Come back in 1 month for blood pressure recheck. I will let you know the results of today's visit when they come back.    It's always a pleasure to see you.  Thank you for coming in today. I hope you feel we met your needs.  Feel free to call [facility] if you have any questions or further requests.  Please consider signing up for MyChart if you do not already have it, as this is a great way to communicate with me.  Best,  Whitney McVey, PA-C  IF you received an x-ray today, you will receive an [facility]. Please contact [facility] at [phone] questions or concerns regarding your invoice.   IF you received labwork today, you will receive an invoice from [place] at [phone] questions or concerns regarding your invoice.   Our billing staff will not be able to assist you with questions regarding bills from these companies.  You will be contacted with the lab results as soon as they are available. The fastest way to get your results is to activate your My Chart account. Instructions are located on the last page of this paperwork. If you have not heard from [place] the results in 2 weeks, please contact this office.

## 2016-06-22 NOTE — Progress Notes (Signed)
Presents today for [employer] Visit-Subsequent.  Date of last exam: September 2016  Interpreter used for this visit? no  Patient Care Team: Dorise Hiss, PA-C as PCP - General (Physician Assistant) Lorenza Evangelist, MD as Consulting Physician (Urology)  Other items to address today: HTN- today's blood pressure is 164/91. He is out of his Amlodipine. He takes home blood pressures. Last week 148/80  PMH prostate cancer and HTN  HTN - Zestoretic 20-12.67m bid and Amlodipine 156mqd.   He has a weight loss goal of 20 lbs for 2018. He does cardio daily [facility]. He is taking classes to help him read food labels and making healthy changes to his diet. This is his 3 week - he has 35 more days left of this program.   Cancer Screening: Colon: yes - last colonoscopy was 05/2008: normal colon; no polyps or cancers - repeat in 10 years  Prostate: yes   Other Screening: Last screening for diabetes: 01/11/2016 Last lipid screening: 11/2014   Immunization status:  Immunization History  Administered Date(s) Administered  . Influenza,inj,Quad PF,36+ Mos 02/13/2013, 12/11/2014, 02/19/2016  . Td 02/13/2013     Health Maintenance Due  Topic Date Due  . HIV Screening  10/06/1966   Advanced Directives Discussed. Pt declines information today.   Home Environment:  Lives with his wife   Patient Active Problem List   Diagnosis Date Noted  . SUI (stress urinary incontinence), male 12/14/2015  . 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 History of prostate cancer 02/13/2013  . HTN (hypertension) 10/08/2011  . Vitamin D deficiency 10/08/2011  . Chronic pain due to trauma 10/08/2011     Past Medical History:  Diagnosis Date  . Cancer (HCPolk  . Hypertension      Past Surgical History:  Procedure Laterality Date  . PROSTATE SURGERY       No family history on  file.   Social History   Social History  . Marital status: Married    Spouse name: N/A  . Number of children: N/A  . Years of education: N/A   Occupational History  . Not on file.   Social History Main Topics  . Smoking status: Never Smoker  . Smokeless tobacco: Never Used  . Alcohol use 1.2 oz/week    2 Standard drinks or equivalent per week  . Drug use: No  . Sexual activity: Not on file   Other Topics Concern  . Not on file   Social History Narrative  . No narrative on file     No Known Allergies   Prior to Admission medications   Medication Sig Start Date End Date Taking? Authorizing Provider  cyclobenzaprine (FLEXERIL) 5 MG tablet 1 pill by mouth up to every 8 hours as needed. Start with one pill by mouth each bedtime as needed due to sedation 01/11/16  Yes JeWendie AgresteMD  HYDROcodone-acetaminophen (NORCO/VICODIN) 5-325 MG tablet Take 1 tablet by mouth every 6 (six) hours as needed for moderate pain. 01/11/16  Yes JeWendie AgresteMD  lisinopril-hydrochlorothiazide (ZESTORETIC) 20-12.5 MG tablet Take 1 tablet by mouth daily. 02/24/16  Yes Brandis Wixted Whitney Jude Naclerio, PA-C  methocarbamol (ROBAXIN-750) 750 MG tablet Take 1 tablet (750 mg total) by mouth 4 (four) times daily. 02/14/15  Yes ChOrma FlamingMD  sildenafil (REVATIO) 20 MG tablet Take 1 tablet (20 mg total) by  mouth 3 (three) times daily. 12/11/14  Yes Orma Flaming, MD     Depression screen [facility] 2/9 06/22/2016 03/10/2016 02/24/2016 01/11/2016 12/11/2014  Decreased Interest 0 0 0 0 0  Down, Depressed, Hopeless 0 0 0 0 0  PHQ - 2 Score 0 0 0 0 0     Fall Risk  06/22/2016 03/10/2016 02/24/2016 01/11/2016  Falls in the past year? No No No No     Functional Status Survey: Is the patient deaf or have difficulty hearing?: No Does the patient have difficulty seeing, even when wearing glasses/contacts?: No Does the patient have difficulty concentrating, remembering, or making decisions?: No Does the  patient have difficulty walking or climbing stairs?: No Does the patient have difficulty dressing or bathing?: No Does the patient have difficulty doing errands alone such as visiting a doctor's office or shopping?: No     PHYSICAL EXAM: BP (!) 164/91 (BP Location: Right Arm, Patient Position: Sitting, Cuff Size: Large)   Pulse (!) 110   Temp 98.2 F (36.8 C) (Oral)   Resp 16   Ht _0  (1.803 m)   Wt 238 lb 6.4 oz (108.1 kg)   SpO2 97%   BMI 33.25 kg/m    Wt Readings from Last 3 Encounters:  06/22/16 238 lb 6.4 oz (108.1 kg)  03/10/16 239 lb (108.4 kg)  02/24/16 239 lb (108.4 kg)      Visual Acuity Screening   Right eye Left eye Both eyes  Without correction: _1  With correction:         Physical Exam  Constitutional: He is oriented to person, place, and time. He appears well-developed and well-nourished.  Eyes: Conjunctivae are normal. Pupils are equal, round, and reactive to light.  Neck: Normal range of motion. Neck supple. No thyromegaly present.  Cardiovascular: Normal rate and regular rhythm.   Respiratory: Effort normal and breath sounds normal. No respiratory distress.  GI: Soft. Bowel sounds are normal. There is no tenderness.  Musculoskeletal: Normal range of motion.  Neurological: He is alert and oriented to person, place, and time. He has normal reflexes.  Skin: Skin is warm and dry.  Psychiatric: He has a normal mood and affect. His behavior is normal. Judgment and thought content normal.     Education/Counseling: yes diet and exercise yes prevention of chronic diseases yes smoking/tobacco cessation yes review "Covered Medicare Preventive Services"   ASSESSMENT/PLAN: 1. Medicare annual wellness visit, subsequent 2. Elevated blood pressure reading 3. Hypertension, unspecified type - Recheck vitals - POCT urinalysis dipstick - POCT Microscopic Urinalysis ([facility]) - lisinopril-hydrochlorothiazide (PRINZIDE,ZESTORETIC) 20-25 MG  tablet; Take 1 tablet by mouth daily.  Dispense: 90 tablet; Refill: 3 - amLODipine (NORVASC) 10 MG tablet; Take 1 tablet (10 mg total) by mouth daily.  Dispense: 90 tablet; Refill: 3 - Uncontrolled HTN - Increase dose of Zestoretic from 20-12.92m bid to 20-257mbid. Cont Amlodipine 1068md. RTC in 1 month for blood pressure recheck. Labs are pending. Will contact with results.   4. Lipid screening - Lipid panel  5. Screening for diabetes mellitus - CMP14+EGFR  6. Screening, anemia, deficiency, iron - CBC with Differential/Platelet  7. Screening for thyroid disorder - TSH  8. Screening for HIV (human immunodeficiency virus) - HIV antibody  9. History of prostate cancer - PSA   WhiMercer PodA-C [facility], 2018 10:30

## 2016-06-23 LAB — CMP14+EGFR
ALT: 17 IU/L (ref 0–44)
AST: 18 IU/L (ref 0–40)
Albumin/Globulin Ratio: 1.8 (ref 1.2–2.2)
Albumin: 4.6 g/dL (ref 3.6–4.8)
Alkaline Phosphatase: 74 IU/L (ref 39–117)
BUN/Creatinine Ratio: 13 (ref 10–24)
BUN: 12 mg/dL (ref 8–27)
Bilirubin Total: 0.4 mg/dL (ref 0.0–1.2)
CO2: 25 mmol/L (ref 18–29)
Calcium: 9.8 mg/dL (ref 8.6–10.2)
Chloride: 95 mmol/L — ABNORMAL LOW (ref 96–106)
Creatinine, Ser: 0.9 mg/dL (ref 0.76–1.27)
GFR calc Af Amer: 104 mL/min/{1.73_m2} (ref >59)
GFR calc non Af Amer: 90 mL/min/{1.73_m2} (ref >59)
Globulin, Total: 2.6 g/dL (ref 1.5–4.5)
Glucose: 106 mg/dL — ABNORMAL HIGH (ref 65–99)
Potassium: 4.2 mmol/L (ref 3.5–5.2)
Sodium: 136 mmol/L (ref 134–144)
Total Protein: 7.2 g/dL (ref 6.0–8.5)

## 2016-06-23 LAB — LIPID PANEL
Chol/HDL Ratio: 2.5 ratio units (ref 0.0–5.0)
Cholesterol, Total: 150 mg/dL (ref 100–199)
HDL: 59 mg/dL (ref >39)
LDL Calculated: 74 mg/dL (ref 0–99)
Triglycerides: 85 mg/dL (ref 0–149)
VLDL Cholesterol Cal: 17 mg/dL (ref 5–40)

## 2016-06-23 LAB — CBC WITH DIFFERENTIAL/PLATELET
Basophils Absolute: 0 10*3/uL (ref 0.0–0.2)
Basos: 1 %
EOS (ABSOLUTE): 0 10*3/uL (ref 0.0–0.4)
Eos: 0 %
Hematocrit: 44.6 % (ref 37.5–51.0)
Hemoglobin: 15 g/dL (ref 13.0–17.7)
Immature Grans (Abs): 0 10*3/uL (ref 0.0–0.1)
Immature Granulocytes: 0 %
Lymphocytes Absolute: 1 10*3/uL (ref 0.7–3.1)
Lymphs: 26 %
MCH: 27.5 pg (ref 26.6–33.0)
MCHC: 33.6 g/dL (ref 31.5–35.7)
MCV: 82 fL (ref 79–97)
Monocytes Absolute: 0.3 10*3/uL (ref 0.1–0.9)
Monocytes: 8 %
Neutrophils Absolute: 2.5 10*3/uL (ref 1.4–7.0)
Neutrophils: 65 %
Platelets: 213 10*3/uL (ref 150–379)
RBC: 5.45 x10E6/uL (ref 4.14–5.80)
RDW: 14.7 % (ref 12.3–15.4)
WBC: 3.9 10*3/uL (ref 3.4–10.8)

## 2016-06-23 LAB — HIV ANTIBODY (ROUTINE TESTING W REFLEX): HIV Screen 4th Generation wRfx: NONREACTIVE

## 2016-06-23 LAB — TSH: TSH: 0.995 u[IU]/mL (ref 0.450–4.500)

## 2016-06-23 LAB — PSA: Prostate Specific Ag, Serum: <0.1 ng/mL (ref 0.0–4.0)

## 2016-06-30 NOTE — Progress Notes (Signed)
Please call pt and let him know his labs look fantastic! - blood counts, kidneys, liver, Cholesterol, thyroid, PSA are within normal limits. He is negative for HIV.  Come back and see Jason Valencia in 1 month for blood pressure recheck Thank you!

## 2016-07-20 ENCOUNTER — Encounter: Payer: Self-pay | Admitting: Physician Assistant

## 2016-07-20 ENCOUNTER — Ambulatory Visit (INDEPENDENT_AMBULATORY_CARE_PROVIDER_SITE_OTHER): Payer: Medicare Other | Admitting: Physician Assistant

## 2016-07-20 VITALS — BP 138/87 | HR 97 | Temp 97.6°F | Ht 71.0 in | Wt 231.0 lb

## 2016-07-20 DIAGNOSIS — I10 Essential (primary) hypertension: Secondary | ICD-10-CM | POA: Diagnosis not present

## 2016-07-20 DIAGNOSIS — R079 Chest pain, unspecified: Secondary | ICD-10-CM | POA: Diagnosis not present

## 2016-07-20 DIAGNOSIS — Z79899 Other long term (current) drug therapy: Secondary | ICD-10-CM

## 2016-07-20 DIAGNOSIS — R030 Elevated blood-pressure reading, without diagnosis of hypertension: Secondary | ICD-10-CM | POA: Diagnosis not present

## 2016-07-20 MED ORDER — AMLODIPINE BESYLATE 10 MG PO TABS: 10.0000 mg | ORAL_TABLET | Freq: Every day | ORAL | 3 refills | Status: DC

## 2016-07-20 NOTE — Progress Notes (Signed)
Jason Valencia  MRN: [id number] DOB: 12-19-1950  PCP: Gelene Mink Nielle Duford, PA-C  Subjective:  Pt is a pleasant 66 year old male who presents to clinic for f/u blood pressure check.   H/o HTN - He was here one month ago for annual exam, blood pressure was elevated. Increase dose of Zestoretic from 20-12.5mg  bid to 20-25mg  bid. Cont Amlodipine 10mg  qd. He is feeling well on this dose. No complaints. Denies side effects.  His last dose of blood pressure medications was this morning. Today's blood pressure is 155/84, recheck 138/87.  He has a weight loss goal of 20 lbs for 2018. He does cardio daily and lifts weights twice a week at the [facility]. He is thinking about starting with a trainer. He is taking classes to help him read food labels and making healthy changes to his diet. He is finished with this program and feels like it has helped him a lot. He is making small changes to his diet.    C/o intermittent pain to left side of chest. Happens once every few months. Has been happening for about a year or two.  Is a "sharp" pain. Located upper, outer quadrant of his left pectoralis muscles. Is not associated with activity. The last time it occurred he was sitting in church. Area is not tender to touch. No recent increase in physical activity. No reduced ROM left shoulder.   Denies chest pain with exertion, shob, neck pain, arm pain, chest tightness, lightheadedness.  Left shoulder surgery about 6-7 years ago.   Review of Systems  Constitutional: Negative for chills and diaphoresis.  Respiratory: Negative for cough, chest tightness, shortness of breath and wheezing.   Cardiovascular: Positive for chest pain (along superolateral pectoralis). Negative for palpitations and leg swelling.  Gastrointestinal: Negative for diarrhea, nausea and vomiting.  Musculoskeletal: Negative for neck pain.  Neurological: Negative for dizziness, syncope, light-headedness and headaches.  Psychiatric/Behavioral:  Negative for sleep disturbance. The patient is not nervous/anxious.     Patient Active Problem List   Diagnosis Date Noted  . SUI (stress urinary incontinence), male 12/14/2015  . 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 History of prostate cancer 02/13/2013  . HTN (hypertension) 10/08/2011  . Vitamin D deficiency 10/08/2011  . Chronic pain due to trauma 10/08/2011    Current Outpatient Prescriptions on File Prior to Visit  Medication Sig Dispense Refill  . amLODipine (NORVASC) 10 MG tablet Take 1 tablet (10 mg total) by mouth daily. 90 tablet 3  . cyclobenzaprine (FLEXERIL) 5 MG tablet 1 pill by mouth up to every 8 hours as needed. Start with one pill by mouth each bedtime as needed due to sedation 15 tablet 0  . HYDROcodone-acetaminophen (NORCO/VICODIN) 5-325 MG tablet Take 1 tablet by mouth every 6 (six) hours as needed for moderate pain. 20 tablet 0  . lisinopril-hydrochlorothiazide (PRINZIDE,ZESTORETIC) 20-25 MG tablet Take 1 tablet by mouth daily. 90 tablet 3  . lisinopril-hydrochlorothiazide (ZESTORETIC) 20-12.5 MG tablet Take 1 tablet by mouth daily. 90 tablet 3  . methocarbamol (ROBAXIN-750) 750 MG tablet Take 1 tablet (750 mg total) by mouth 4 (four) times daily. 40 tablet 3  . sildenafil (REVATIO) 20 MG tablet Take 1 tablet (20 mg total) by mouth 3 (three) times daily. 60 tablet 6   No current facility-administered medications on file prior to visit.     No Known Allergies   Objective:  There were no vitals taken for this visit.  Physical Exam  Constitutional: He is oriented  to person, place, and time and well-developed, well-nourished, and in no distress. No distress.  Neck: Carotid bruit is not present.  Cardiovascular: Normal rate, regular rhythm, normal heart sounds and normal pulses.  Exam reveals no gallop.   No murmur heard. Pulmonary/Chest: Effort normal. No respiratory distress.  Abdominal: Soft. Normal appearance. He exhibits no abdominal bruit.    Neurological: He is alert and oriented to person, place, and time. GCS score is 15.  Skin: Skin is warm and dry.  Psychiatric: Mood, memory, affect and judgment normal.  Vitals reviewed.  EKG is unchanged from 11/2014. NSR, no S-T abnormalities. No bradycardia or tachycardia.  Assessment and Plan :  1. Hypertension, unspecified type 2. Elevated blood pressure reading 3. Medication management 4. Chest pain, unspecified type - amLODipine (NORVASC) 10 MG tablet; Take 1 tablet (10 mg total) by mouth daily.  Dispense: 90 tablet; Refill: 3 - Recheck vitals - EKG 12-Lead - No acute changes to EKG, not concerned for ischemia. If condition worsens, consider cardiology referral. He agrees with plan.  - HTN controlled today - bp is 138/87. Con't medications as prescribed. F/u in 6 months.   Mercer Pod, PA-C  [facility] 07/20/2016 11:40 AM

## 2016-07-20 NOTE — Patient Instructions (Addendum)
Your EKG looks fine. Continue taking your blood pressure medications as directed.  Keep up the good work! It's always a pleasure to see you.  Come back and see me in 3 months.   Thank you for coming in today. I hope you feel we met your needs.  Feel free to call [facility] if you have any questions or further requests.  Please consider signing up for MyChart if you do not already have it, as this is a great way to communicate with me.  Best,  [employer], PA-C    Exercising to [employer] Exercising can help you to lose weight. In order to lose weight through exercise, you need to do vigorous-intensity exercise. You can tell that you are exercising with vigorous intensity if you are breathing very hard and fast and cannot hold a conversation while exercising. Moderate-intensity exercise helps to maintain your current weight. You can tell that you are exercising at a moderate level if you have a higher heart rate and faster breathing, but you are still able to hold a conversation. How often should I exercise? Choose an activity that you enjoy and set realistic goals. Your health care provider can help you to make an activity plan that works for you. Exercise regularly as directed by your health care provider. This may include:  Doing resistance training twice each week, such as:  Push-ups.  Sit-ups.  Lifting weights.  Using resistance bands.  Doing a given intensity of exercise for a given amount of time. Choose from these options:  150 minutes of moderate-intensity exercise every week.  75 minutes of vigorous-intensity exercise every week.  A mix of moderate-intensity and vigorous-intensity exercise every week. Children, pregnant women, people who are out of shape, people who are overweight, and older adults may need to consult a health care provider for individual recommendations. If you have any sort of medical condition, be sure to consult your health care provider before starting  a new exercise program. What are some activities that can help me to lose weight?  Walking at a rate of at least 4.5 miles an hour.  Jogging or running at a rate of 5 miles per hour.  Biking at a rate of at least 10 miles per hour.  Lap swimming.  Roller-skating or in-line skating.  Cross-country skiing.  Vigorous competitive sports, such as football, basketball, and soccer.  Jumping rope.  Aerobic dancing. How can I be more active in my day-to-day activities?  Use the stairs instead of the elevator.  Take a walk during your lunch break.  If you drive, park your car farther away from work or school.  If you take public transportation, get off one stop early and walk the rest of the way.  Make all of your phone calls while standing up and walking around.  Get up, stretch, and walk around every 30 minutes throughout the day. What guidelines should I follow while exercising?  Do not exercise so much that you hurt yourself, feel dizzy, or get very short of breath.  Consult your health care provider prior to starting a new exercise program.  Wear comfortable clothes and shoes with good support.  Drink plenty of water while you exercise to prevent dehydration or heat stroke. Body water is lost during exercise and must be replaced.  Work out until you breathe faster and your heart beats faster. This information is not intended to replace advice given to you by your health care provider. Make sure you discuss  any questions you have with your health care provider. Document Released: 04/18/2010 Document Revised: 08/22/2015 Document Reviewed: 08/17/2013 Elsevier Interactive Patient Education  2017 [employer].  IF you received an x-ray today, you will receive an invoice from [facility]. Please contact [facility] at [phone] with questions or concerns regarding your invoice.   IF you received labwork today, you will receive an invoice from [place].  Please contact [facility] at [phone] with questions or concerns regarding your invoice.   Our billing staff will not be able to assist you with questions regarding bills from these companies.  You will be contacted with the lab results as soon as they are available. The fastest way to get your results is to activate your My Chart account. Instructions are located on the last page of this paperwork. If you have not heard from [place] regarding the results in 2 weeks, please contact this office.

## 2016-08-18 ENCOUNTER — Telehealth: Payer: Self-pay | Admitting: Physician Assistant

## 2016-08-18 NOTE — Telephone Encounter (Signed)
Patient needs disability forms completed, they are just like the ones that have been completed before. I have copied the old form that are scanned under media, they just need to be signed. I will place the forms in Jason Valencia's box on 08/18/16 please return them to the FMLA/Disability box at the 102 checkout desk within 5-7 business days. Thank you!

## 2016-08-19 NOTE — Telephone Encounter (Signed)
THANK YOU FOR FILLING THIS OUT!!!!!!!

## 2016-08-19 NOTE — Telephone Encounter (Signed)
Forms placed in FMLA box

## 2016-08-29 ENCOUNTER — Other Ambulatory Visit: Payer: Self-pay | Admitting: Family Medicine

## 2016-08-29 ENCOUNTER — Other Ambulatory Visit: Payer: Self-pay | Admitting: Physician Assistant

## 2016-08-29 DIAGNOSIS — I10 Essential (primary) hypertension: Secondary | ICD-10-CM

## 2016-08-29 MED ORDER — IBUPROFEN 800 MG PO TABS: 800.0000 mg | ORAL_TABLET | Freq: Three times a day (TID) | ORAL | 0 refills | Status: DC | PRN

## 2016-08-29 NOTE — Telephone Encounter (Signed)
Please check quantity and then sign

## 2016-08-29 NOTE — Telephone Encounter (Signed)
Pt needs a refill on ibuprofen (ADVIL,MOTRIN) 800   Please advise

## 2016-08-29 NOTE — Telephone Encounter (Signed)
06/2016 last ov I see ibuprofen on list as historical only Ok to rx?

## 2016-08-29 NOTE — Telephone Encounter (Signed)
Ok to rx

## 2016-11-18 ENCOUNTER — Ambulatory Visit (INDEPENDENT_AMBULATORY_CARE_PROVIDER_SITE_OTHER): Payer: Medicare Other | Admitting: Physician Assistant

## 2016-11-18 ENCOUNTER — Encounter: Payer: Self-pay | Admitting: Physician Assistant

## 2016-11-18 DIAGNOSIS — G8929 Other chronic pain: Secondary | ICD-10-CM | POA: Diagnosis not present

## 2016-11-18 DIAGNOSIS — M545 Low back pain, unspecified: Secondary | ICD-10-CM

## 2016-11-18 DIAGNOSIS — M48061 Spinal stenosis, lumbar region without neurogenic claudication: Secondary | ICD-10-CM | POA: Diagnosis not present

## 2016-11-18 MED ORDER — CYCLOBENZAPRINE HCL 5 MG PO TABS: ORAL_TABLET | ORAL | 0 refills | Status: DC

## 2016-11-18 MED ORDER — METHOCARBAMOL 750 MG PO TABS: 750.0000 mg | ORAL_TABLET | Freq: Four times a day (QID) | ORAL | 3 refills | Status: DC

## 2016-11-18 MED ORDER — HYDROCODONE-ACETAMINOPHEN 5-325 MG PO TABS: 1.0000 | ORAL_TABLET | Freq: Four times a day (QID) | ORAL | 0 refills | Status: DC | PRN

## 2016-11-18 NOTE — Progress Notes (Signed)
Jason Valencia  MRN: [id number] DOB: 1951/02/27  PCP: Dorise Hiss, PA-C  Subjective:  Pt is a pleasant 66 year old male PMH HTN, vitamin D deficiency, SUI, h/o prostate cancer who presents to clinic for multiple complaints.   Back pain x 4 days. He "over did it" with chores around his house. This is not a new problem for him. H/o spinal stenosis. He has had significant back pain x several years. Pain is not every day - he has "flare-ups" a few times a year. He exercises and stretches regularly  Medication refill - Flexeril, robaxin and vicodan.  Flexeril helps him sleep with back pain. Last refill 12/2015 Robaxin - he takes this during the day for his muscles being "tense".  Last refill 01/2015 Norco for pain Last refill 12/2015  Went to physical therapy about 2 years ago - helped a lot. He will consider this option if current pain does not resolve.   Review of Systems  Gastrointestinal: Negative for constipation and diarrhea.  Genitourinary: Negative for dysuria and enuresis.  Musculoskeletal: Positive for back pain.  Neurological: Negative for weakness and numbness.  Psychiatric/Behavioral: Positive for sleep disturbance.    Patient Active Problem List   Diagnosis Date Noted  . SUI (stress urinary incontinence), male 12/14/2015  . 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 History of prostate cancer 02/13/2013  . HTN (hypertension) 10/08/2011  . Vitamin D deficiency 10/08/2011  . Chronic pain due to trauma 10/08/2011    Current Outpatient Prescriptions on File Prior to Visit  Medication Sig Dispense Refill  . amLODipine (NORVASC) 10 MG tablet Take 1 tablet (10 mg total) by mouth daily. 90 tablet 3  . cyclobenzaprine (FLEXERIL) 5 MG tablet 1 pill by mouth up to every 8 hours as needed. Start with one pill by mouth each bedtime as needed due to sedation 15 tablet 0  . HYDROcodone-acetaminophen (NORCO/VICODIN) 5-325 MG tablet Take 1 tablet by mouth every 6 (six)  hours as needed for moderate pain. 20 tablet 0  . ibuprofen (ADVIL,MOTRIN) 800 MG tablet Take 1 tablet (800 mg total) by mouth every 8 (eight) hours as needed. 90 tablet 0  . lisinopril-hydrochlorothiazide (PRINZIDE,ZESTORETIC) 20-25 MG tablet Take 1 tablet by mouth daily. 90 tablet 3  . lisinopril-hydrochlorothiazide (ZESTORETIC) 20-12.5 MG tablet Take 1 tablet by mouth daily. 90 tablet 3  . methocarbamol (ROBAXIN-750) 750 MG tablet Take 1 tablet (750 mg total) by mouth 4 (four) times daily. 40 tablet 3  . sildenafil (REVATIO) 20 MG tablet Take 1 tablet (20 mg total) by mouth 3 (three) times daily. 60 tablet 6   No current facility-administered medications on file prior to visit.     No Known Allergies   Objective:  BP (!) 150/88   Pulse 95   Temp 97.6 F (36.4 C) (Oral)   Resp 18   Ht 5\' 11"  (1.803 m)   Wt 229 lb (103.9 kg)   SpO2 97%   BMI 31.94 kg/m   Physical Exam  Constitutional: He is oriented to person, place, and time and well-developed, well-nourished, and in no distress. No distress.  Cardiovascular: Normal rate, regular rhythm and normal heart sounds.   Musculoskeletal:       Lumbar back: He exhibits tenderness. He exhibits normal range of motion and no bony tenderness.  Pain with extension from hips.   Neurological: He is alert and oriented to person, place, and time. GCS score is 15.  Skin: Skin is warm and dry.  Psychiatric: Mood, memory, affect and judgment normal.  Vitals reviewed.   Assessment and Plan :  1. Chronic midline low back pain without sciatica 2. Spinal stenosis of lumbar region, unspecified whether neurogenic claudication present - HYDROcodone-acetaminophen (NORCO/VICODIN) 5-325 MG tablet; Take 1 tablet by mouth every 6 (six) hours as needed for moderate pain.  Dispense: 20 tablet; Refill: 0 - cyclobenzaprine (FLEXERIL) 5 MG tablet; 1 pill by mouth up to every 8 hours as needed. Start with one pill by mouth each bedtime as needed due to sedation   Dispense: 30 tablet; Refill: 0 - methocarbamol (ROBAXIN-750) 750 MG tablet; Take 1 tablet (750 mg total) by mouth 4 (four) times daily.  Dispense: 40 tablet; Refill: 3 He will consider this option if current pain does not resolve.  - H/o spinal stenosis. Pt has not had refills of pain medication in about a year. OK to refill for this flare. Will refer to physical therapy in the future if pain does not resolve. Discussed with pt stretching and strengthening techniques.   Mercer Pod, PA-C  [facility] 11/18/2016 12:05 PM

## 2016-11-18 NOTE — Patient Instructions (Addendum)
Thank you for coming in today. I hope you feel we met your needs.  Feel free to call PCP if you have any questions or further requests.  Please consider signing up for MyChart if you do not already have it, as this is a great way to communicate with me.  Best,  Whitney McVey, PA-C  Low Back Strain Rehab Ask your health care provider which exercises are safe for you. Do exercises exactly as told by your health care provider and adjust them as directed. It is normal to feel mild stretching, pulling, tightness, or discomfort as you do these exercises, but you should stop right away if you feel sudden pain or your pain gets worse. Do not begin these exercises until told by your health care provider. Stretching and range of motion exercises These exercises warm up your muscles and joints and improve the movement and flexibility of your back. These exercises also help to relieve pain, numbness, and tingling. Exercise A: Single knee to chest  1. Lie on your back on a firm surface with both legs straight. 2. Bend one of your knees. Use your hands to move your knee up toward your chest until you feel a gentle stretch in your lower back and buttock. ? Hold your leg in this position by holding onto the front of your knee. ? Keep your other leg as straight as possible. 3. Hold for __________ seconds. 4. Slowly return to the starting position. 5. Repeat with your other leg. Repeat __________ times. Complete this exercise __________ times a day. Exercise B: Prone extension on elbows  1. Lie on your abdomen on a firm surface. 2. Prop yourself up on your elbows. 3. Use your arms to help lift your chest up until you feel a gentle stretch in your abdomen and your lower back. ? This will place some of your body weight on your elbows. If this is uncomfortable, try stacking pillows under your chest. ? Your hips should stay down, against the surface that you are lying on. Keep your hip and back muscles  relaxed. 4. Hold for __________ seconds. 5. Slowly relax your upper body and return to the starting position. Repeat __________ times. Complete this exercise __________ times a day. Strengthening exercises These exercises build strength and endurance in your back. Endurance is the ability to use your muscles for a long time, even after they get tired. Exercise C: Pelvic tilt 1. Lie on your back on a firm surface. Bend your knees and keep your feet flat. 2. Tense your abdominal muscles. Tip your pelvis up toward the ceiling and flatten your lower back into the floor. ? To help with this exercise, you may place a small towel under your lower back and try to push your back into the towel. 3. Hold for __________ seconds. 4. Let your muscles relax completely before you repeat this exercise. Repeat __________ times. Complete this exercise __________ times a day. Exercise D: Alternating arm and leg raises  1. Get on your hands and knees on a firm surface. If you are on a hard floor, you may want to use padding to cushion your knees, such as an exercise mat. 2. Line up your arms and legs. Your hands should be below your shoulders, and your knees should be below your hips. 3. Lift your left leg behind you. At the same time, raise your right arm and straighten it in front of you. ? Do not lift your leg higher than your hip. ?  Do not lift your arm higher than your shoulder. ? Keep your abdominal and back muscles tight. ? Keep your hips facing the ground. ? Do not arch your back. ? Keep your balance carefully, and do not hold your breath. 4. Hold for __________ seconds. 5. Slowly return to the starting position and repeat with your right leg and your left arm. Repeat __________ times. Complete this exercise __________times a day. Exercise J: Single leg lower with bent knees 1. Lie on your back on a firm surface. 2. Tense your abdominal muscles and lift your feet off the floor, one foot at a time, so  your knees and hips are bent in an "L" shape (at about 90 degrees). ? Your knees should be over your hips and your lower legs should be parallel to the floor. 3. Keeping your abdominal muscles tense and your knee bent, slowly lower one of your legs so your toe touches the ground. 4. Lift your leg back up to return to the starting position. ? Do not hold your breath. ? Do not let your back arch. Keep your back flat against the ground. 5. Repeat with your other leg. Repeat __________ times. Complete this exercise __________ times a day. Posture and body mechanics  Body mechanics refers to the movements and positions of your body while you do your daily activities. Posture is part of body mechanics. Good posture and healthy body mechanics can help to relieve stress in your body's tissues and joints. Good posture means that your spine is in its natural S-curve position (your spine is neutral), your shoulders are pulled back slightly, and your head is not tipped forward. The following are general guidelines for applying improved posture and body mechanics to your everyday activities. Standing   When standing, keep your spine neutral and your feet about hip-width apart. Keep a slight bend in your knees. Your ears, shoulders, and hips should line up.  When you do a task in which you stand in one place for a long time, place one foot up on a stable object that is 2-4 inches (5-10 cm) high, such as a footstool. This helps keep your spine neutral. Sitting   When sitting, keep your spine neutral and keep your feet flat on the floor. Use a footrest, if necessary, and keep your thighs parallel to the floor. Avoid rounding your shoulders, and avoid tilting your head forward.  When working at a desk or a computer, keep your desk at a height where your hands are slightly lower than your elbows. Slide your chair under your desk so you are close enough to maintain good posture.  When working at a computer,  place your monitor at a height where you are looking straight ahead and you do not have to tilt your head forward or downward to look at the screen. Resting   When lying down and resting, avoid positions that are most painful for you.  If you have pain with activities such as sitting, bending, stooping, or squatting (flexion-based activities), lie in a position in which your body does not bend very much. For example, avoid curling up on your side with your arms and knees near your chest (fetal position).  If you have pain with activities such as standing for a long time or reaching with your arms (extension-based activities), lie with your spine in a neutral position and bend your knees slightly. Try the following positions: ? Lying on your side with a pillow between your knees. ?  Lying on your back with a pillow under your knees. Lifting   When lifting objects, keep your feet at least shoulder-width apart and tighten your abdominal muscles.  Bend your knees and hips and keep your spine neutral. It is important to lift using the strength of your legs, not your back. Do not lock your knees straight out.  Always ask for help to lift heavy or awkward objects. This information is not intended to replace advice given to you by your health care provider. Make sure you discuss any questions you have with your health care provider. Document Released: 03/16/2005 Document Revised: 11/21/2015 Document Reviewed: 12/26/2014 Elsevier Interactive Patient Education  2018 [employer].   IF you received an x-ray today, you will receive an invoice from [facility]. Please contact [facility] at [phone] with questions or concerns regarding your invoice.   IF you received labwork today, you will receive an invoice from [facility]. Please contact [facility] at [phone] with questions or concerns regarding your invoice.   Our billing staff will not be able to assist you with questions  regarding bills from these companies.  You will be contacted with the lab results as soon as they are available. The fastest way to get your results is to activate your My Chart account. Instructions are located on the last page of this paperwork. If you have not heard from [place] regarding the results in 2 weeks, please contact this office.

## 2016-12-09 ENCOUNTER — Telehealth: Payer: Self-pay | Admitting: Physician Assistant

## 2016-12-09 NOTE — Telephone Encounter (Signed)
PATIENT IS CALLING TO LET [place] KNOW THAT HE RECEIVED 2 LETTERS IN THE MAIL FROM [facility] SAYING 1 OR MORE OF HIS PRESCRIPTIONS HAS NOT BEEN REFILLED FOR 1 OF THE FOLLOWING REASONS. (HE NEEDS TO CONTACT HIS PRIMARY CARE PROVIDER). HE SAID HE WAS JUST IN THE OFFICE IN AUGUST AND HE AND WHITNEY WENT OVER ALL OF HIS MEDICATIONS. BEST PHONE [phone] (CELL) [place]

## 2016-12-14 ENCOUNTER — Other Ambulatory Visit: Payer: Self-pay | Admitting: *Deleted

## 2016-12-14 ENCOUNTER — Other Ambulatory Visit: Payer: Self-pay | Admitting: Physician Assistant

## 2016-12-14 DIAGNOSIS — I10 Essential (primary) hypertension: Secondary | ICD-10-CM

## 2016-12-14 DIAGNOSIS — M545 Low back pain, unspecified: Secondary | ICD-10-CM

## 2016-12-14 DIAGNOSIS — G8929 Other chronic pain: Secondary | ICD-10-CM

## 2016-12-14 MED ORDER — AMLODIPINE BESYLATE 10 MG PO TABS: 10.0000 mg | ORAL_TABLET | Freq: Every day | ORAL | 2 refills | Status: DC

## 2016-12-14 MED ORDER — CYCLOBENZAPRINE HCL 5 MG PO TABS: ORAL_TABLET | ORAL | 0 refills | Status: DC

## 2016-12-14 MED ORDER — IBUPROFEN 800 MG PO TABS: 800.0000 mg | ORAL_TABLET | Freq: Three times a day (TID) | ORAL | 0 refills | Status: DC | PRN

## 2016-12-14 NOTE — Telephone Encounter (Signed)
Jason Valencia switch his Norvac to [facility] and Ibupropen.  Patient [facility]il [facility] he states they told him he can get a 90 day supply.  Please Advise

## 2016-12-14 NOTE — Telephone Encounter (Signed)
Flexeril sent to [facility]

## 2016-12-15 NOTE — Telephone Encounter (Signed)
LM Prescription sent in.

## 2017-01-21 ENCOUNTER — Telehealth: Payer: Self-pay | Admitting: Physician Assistant

## 2017-01-21 NOTE — Telephone Encounter (Signed)
Pt called stated that she he would like a referral for physical therapy for his back..  Please advise

## 2017-01-22 ENCOUNTER — Other Ambulatory Visit: Payer: Self-pay | Admitting: Physician Assistant

## 2017-01-22 DIAGNOSIS — G8929 Other chronic pain: Secondary | ICD-10-CM

## 2017-01-22 DIAGNOSIS — M545 Low back pain: Principal | ICD-10-CM

## 2017-01-25 ENCOUNTER — Other Ambulatory Visit: Payer: Self-pay | Admitting: Physician Assistant

## 2017-02-03 ENCOUNTER — Other Ambulatory Visit: Payer: Self-pay | Admitting: Physician Assistant

## 2017-02-03 DIAGNOSIS — G8929 Other chronic pain: Secondary | ICD-10-CM

## 2017-02-03 DIAGNOSIS — M545 Low back pain, unspecified: Secondary | ICD-10-CM

## 2017-02-04 ENCOUNTER — Other Ambulatory Visit: Payer: Self-pay | Admitting: Physician Assistant

## 2017-02-04 DIAGNOSIS — G8929 Other chronic pain: Secondary | ICD-10-CM

## 2017-02-04 DIAGNOSIS — M545 Low back pain: Principal | ICD-10-CM

## 2017-02-05 NOTE — Telephone Encounter (Signed)
Copied from [place]. Topic: Quick Communication - See Telephone Encounter >> Feb 05, 2017 10:46 AM Arletha Grippe wrote: CRM for notification. See Telephone encounter for:   02/05/17. Pt lost mediation in luggage when out of town. He has contacted [facility] for refill of cyclobenzaprine (FLEXERIL) 5 MG tablet , has not heard back. He only has one dose left.  He is requesting rx to be 90 day supply and sent to [facility].  Cb number for pt is [phone]

## 2017-02-05 NOTE — Telephone Encounter (Signed)
Flexeril: refill ° °

## 2017-06-02 ENCOUNTER — Other Ambulatory Visit: Payer: Self-pay

## 2017-06-02 ENCOUNTER — Telehealth: Payer: Self-pay | Admitting: Physician Assistant

## 2017-06-02 DIAGNOSIS — I10 Essential (primary) hypertension: Secondary | ICD-10-CM

## 2017-06-04 MED ORDER — LISINOPRIL-HYDROCHLOROTHIAZIDE 20-25 MG PO TABS: 1.0000 | ORAL_TABLET | Freq: Every day | ORAL | 3 refills | Status: DC

## 2017-06-04 NOTE — Telephone Encounter (Signed)
Copied from [place]. Topic: Quick Communication - Rx Refill/Question >> Jun 04, 2017  2:09 PM Ether Griffins B wrote: Medication: lisinopril-hydrochlorothiazide (PRINZIDE,ZESTORETIC) 20-25 MG tablet   Has the patient contacted their pharmacy? Yes.    Pt needing a refill asap. Pharmacy requested on 06/02/17.   (Agent: If no, request that the patient contact the pharmacy for the refill.)   Preferred Pharmacy (with phone number or street name): [id number] - [place], [address] AT [address]   Agent: Please be advised that RX refills may take up to 3 business days. We ask that you follow-up with your pharmacy.

## 2017-06-05 ENCOUNTER — Other Ambulatory Visit: Payer: Self-pay | Admitting: *Deleted

## 2017-08-10 ENCOUNTER — Encounter: Payer: Medicare Other | Admitting: Physician Assistant

## 2017-08-20 ENCOUNTER — Other Ambulatory Visit: Payer: Self-pay

## 2017-08-20 ENCOUNTER — Ambulatory Visit (INDEPENDENT_AMBULATORY_CARE_PROVIDER_SITE_OTHER): Payer: Medicare Other | Admitting: Physician Assistant

## 2017-08-20 ENCOUNTER — Encounter: Payer: Self-pay | Admitting: Physician Assistant

## 2017-08-20 VITALS — BP 146/86 | HR 90 | Temp 98.1°F | Resp 16 | Ht 70.0 in | Wt 219.8 lb

## 2017-08-20 DIAGNOSIS — Z1329 Encounter for screening for other suspected endocrine disorder: Secondary | ICD-10-CM | POA: Diagnosis not present

## 2017-08-20 DIAGNOSIS — Z13220 Encounter for screening for lipoid disorders: Secondary | ICD-10-CM

## 2017-08-20 DIAGNOSIS — Z0000 Encounter for general adult medical examination without abnormal findings: Secondary | ICD-10-CM

## 2017-08-20 DIAGNOSIS — G8929 Other chronic pain: Secondary | ICD-10-CM | POA: Diagnosis not present

## 2017-08-20 DIAGNOSIS — I10 Essential (primary) hypertension: Secondary | ICD-10-CM | POA: Diagnosis not present

## 2017-08-20 DIAGNOSIS — Z130 Encounter for screening for diseases of the blood and blood-forming organs and certain disorders involving the immune mechanism: Secondary | ICD-10-CM

## 2017-08-20 DIAGNOSIS — M545 Low back pain, unspecified: Secondary | ICD-10-CM

## 2017-08-20 DIAGNOSIS — E559 Vitamin D deficiency, unspecified: Secondary | ICD-10-CM

## 2017-08-20 DIAGNOSIS — Z8546 Personal history of malignant neoplasm of prostate: Secondary | ICD-10-CM

## 2017-08-20 LAB — POCT URINALYSIS DIP (MANUAL ENTRY)
Bilirubin, UA: NEGATIVE
Blood, UA: NEGATIVE
Glucose, UA: NEGATIVE mg/dL
Ketones, POC UA: NEGATIVE mg/dL
Leukocytes, UA: NEGATIVE
Nitrite, UA: NEGATIVE
Protein Ur, POC: NEGATIVE mg/dL
Spec Grav, UA: 1.015 (ref 1.010–1.025)
Urobilinogen, UA: 0.2 E.U./dL
pH, UA: 5.5 (ref 5.0–8.0)

## 2017-08-20 MED ORDER — AMLODIPINE BESYLATE 10 MG PO TABS: 10.0000 mg | ORAL_TABLET | Freq: Every day | ORAL | 4 refills | Status: DC

## 2017-08-20 MED ORDER — IBUPROFEN 800 MG PO TABS: ORAL_TABLET | ORAL | 0 refills | Status: DC

## 2017-08-20 MED ORDER — HYDROCODONE-ACETAMINOPHEN 5-325 MG PO TABS: 1.0000 | ORAL_TABLET | Freq: Four times a day (QID) | ORAL | 0 refills | Status: DC | PRN

## 2017-08-20 MED ORDER — LISINOPRIL-HYDROCHLOROTHIAZIDE 20-25 MG PO TABS: 1.0000 | ORAL_TABLET | Freq: Every day | ORAL | 4 refills | Status: DC

## 2017-08-20 NOTE — Progress Notes (Signed)
[facility] at [place], [place] [address] [phone]  Date:  08/20/2017   Name:  Jason Valencia   DOB:  Nov 20, 1950   MRN:  [id number]  PCP:  Dorise Hiss, PA-C    Chief Complaint: Annual Exam ((health maint Prevnar 13)) and Medication Refill (Ibuprofen 800 mg)   History of Present Illness:  This is a 67 y.o. male with PMH HTN, vitamin D deficiency, SUI, h/o prostate cancer who is presenting for CPE. Last annual 05/2016.   Patient Care Team: Dorise Hiss, PA-C as PCP - General (Physician Assistant) Lorenza Evangelist, MD as Consulting Physician (Urology)  HTN- today's blood pressure is 146/86. He is out of his Amlodipine 60m and Prinzide 20-255m He takes home blood pressures. 116/71, 126/81 in the morning,   Vitamin D - takes 1,00045mvery other day.   Malignant tumor of prostate - sees Dr. TerOdis Lusterost recent visit was last month where he had his PSA checked. Pt received robotic-assisted laparoscopic radical prostatectomy in 2011 by Dr. GraRisa GrillV note from Dr. DavRosana Hoes 07/23/2017: "His PSA was <0.01 on 06/24/15. He is still having some leakage of urine. He would like to see Dr. TerOdis Lusterck. Also, he might be interested in a penile prosthesis."  Weight loss goal from last CPE one year ago: "He has a weight loss goal of 20 lbs for 2018. He does cardio daily and [facility] is thinking about starting with a trainer. He is taking classes to help him read food labels and making healthy changes to his diet. This is his 3 week - he has 35 more days left of this program."  Today he is 20lbs less than he was 02/2016. He is feeling great and plans to get down to 210lbs.   Last screening for diabetes: 06/22/2016 Last lipid screening: 06/22/2016  Complaints:  None. Immunizations: need pneumococcal. He declines this today.  Dentist: has not been in a long time. Plans to go soon.  Diet: healthy: smoothies, oatmeal.  Cuts down on sweets.  Exercise: Plans to do senior games next year.  Fam hx: family history is not on file.   Sexual hx: married  Urinary hesitancy/frequency or nocturia: none Tobacco/alcohol/substance use: never smoker; drinks 1.2oz alcohol/week; no drug use.   Colonoscopy: 05/2008: negative. Repeat in 10 years.    Review of Systems:  Review of Systems  Constitutional: Negative for activity change, appetite change and fatigue.  HENT: Negative for congestion, dental problem, sneezing and tinnitus.   Eyes: Negative for visual disturbance.  Respiratory: Negative for cough, chest tightness, shortness of breath and wheezing.   Cardiovascular: Negative for chest pain, palpitations and leg swelling.  Gastrointestinal: Negative for abdominal pain, blood in stool, constipation, diarrhea, nausea and vomiting.  Endocrine: Negative for polydipsia, polyphagia and polyuria.  Genitourinary: Negative for decreased urine volume, difficulty urinating, discharge, hematuria, scrotal swelling and testicular pain.  Musculoskeletal: Positive for back pain. Negative for arthralgias and neck stiffness.  Allergic/Immunologic: Negative for environmental allergies and food allergies.  Neurological: Negative for dizziness, syncope, weakness, light-headedness and headaches.  Psychiatric/Behavioral: Negative for sleep disturbance. The patient is not nervous/anxious.     Patient Active Problem List   Diagnosis Date Noted  . SUI (stress urinary incontinence), male 12/14/2015  . 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 History of prostate cancer 02/13/2013  . HTN (hypertension) 10/08/2011  . Vitamin D deficiency 10/08/2011  . Chronic pain due to  trauma 10/08/2011    Prior to Admission medications   Medication Sig Start Date End Date Taking? Authorizing Provider  amLODipine (NORVASC) 10 MG tablet Take 1 tablet (10 mg total) by mouth daily. 12/14/16  Yes Aniket Paye, Gelene Mink, PA-C  cyclobenzaprine (FLEXERIL)  5 MG tablet 1 pill by mouth up to every 8 hours as needed. Start with one pill by mouth each bedtime as needed due to sedation 12/14/16  Yes Thomes Burak, Gelene Mink, PA-C  cyclobenzaprine (FLEXERIL) 5 MG tablet TAKE ONE TABLET BY MOUTH EVERY 8 HOURS AS NEEDED.  START WITH ONE TABLET AT BEDTIME AS NEEDED DUE TO SEDATION. 02/09/17  Yes Subhan Hoopes, Gelene Mink, PA-C  HYDROcodone-acetaminophen (NORCO/VICODIN) 5-325 MG tablet Take 1 tablet by mouth every 6 (six) hours as needed for moderate pain. 11/18/16  Yes Quantasia Stegner, Gelene Mink, PA-C  ibuprofen (ADVIL,MOTRIN) 800 MG tablet Take 1 tablet (800 mg total) by mouth every 8 (eight) hours as needed. 12/14/16  Yes Jadan Rouillard, Gelene Mink, PA-C  ibuprofen (ADVIL,MOTRIN) 800 MG tablet TAKE 1 TABLET(800 MG) BY MOUTH EVERY 8 HOURS AS NEEDED 01/27/17  Yes Rockwell Zentz, Gelene Mink, PA-C  lisinopril-hydrochlorothiazide (PRINZIDE,ZESTORETIC) 20-25 MG tablet Take 1 tablet by mouth daily. 06/04/17  Yes Jilliane Kazanjian, Gelene Mink, PA-C  methocarbamol (ROBAXIN-750) 750 MG tablet Take 1 tablet (750 mg total) by mouth 4 (four) times daily. 11/18/16  Yes Natelie Ostrosky, Gelene Mink, PA-C  sildenafil (REVATIO) 20 MG tablet Take 1 tablet (20 mg total) by mouth 3 (three) times daily. 12/11/14  Yes Guest, Benn Moulder, MD  lisinopril-hydrochlorothiazide (ZESTORETIC) 20-12.5 MG tablet Take 1 tablet by mouth daily. Patient not taking: Reported on 08/20/2017 02/24/16   Gracee Ratterree, Gelene Mink, PA-C    No Known Allergies  Past Surgical History:  Procedure Laterality Date  . PROSTATE SURGERY      Social History   Tobacco Use  . Smoking status: Never Smoker  . Smokeless tobacco: Never Used  Substance Use Topics  . Alcohol use: Yes    Alcohol/week: 1.2 oz    Types: 2 Standard drinks or equivalent per week  . Drug use: No    No family history on file.  Medication list has been reviewed and updated.  Functional Status Survey: Is the patient deaf or have difficulty hearing?:  No Does the patient have difficulty seeing, even when wearing glasses/contacts?: No Does the patient have difficulty concentrating, remembering, or making decisions?: No Does the patient have difficulty walking or climbing stairs?: No Does the patient have difficulty dressing or bathing?: No Does the patient have difficulty doing errands alone such as visiting a doctor's office or shopping?: No   Physical Examination:  Physical Exam  Constitutional: He is oriented to person, place, and time. No distress.  HENT:  Head: Normocephalic and atraumatic.  Right Ear: External ear normal.  Left Ear: External ear normal.  Nose: Nose normal.  Mouth/Throat: Oropharynx is clear and moist. No oropharyngeal exudate.  Eyes: Pupils are equal, round, and reactive to light. Conjunctivae and EOM are normal. Right eye exhibits no discharge. No scleral icterus.  Neck: Normal range of motion. Neck supple. No tracheal deviation present. No thyromegaly present.  Cardiovascular: Normal rate, regular rhythm and intact distal pulses.  No murmur heard. Pulmonary/Chest: Effort normal and breath sounds normal. No respiratory distress. He has no wheezes.  Abdominal: Soft. Bowel sounds are normal. He exhibits no distension and no mass. There is no tenderness.  Genitourinary: Penis normal. No discharge found.  Musculoskeletal: Normal range of motion.  Lymphadenopathy:  He has no cervical adenopathy.  Neurological: He is alert and oriented to person, place, and time. He has normal reflexes.  Skin: Skin is warm and dry. He is not diaphoretic.  Psychiatric: Judgment normal.    BP (!) 146/86 (BP Location: Left Arm, Patient Position: Sitting, Cuff Size: Normal)   Pulse 90   Temp 98.1 F (36.7 C) (Oral)   Resp 16   Ht _0  (1.778 m)   Wt 219 lb 12.8 oz (99.7 kg)   SpO2 100%   BMI 31.54 kg/m    Vision: right eye 20/25; left eye 20/25; both eyes 20/25. Does not wear glasses.   Wt Readings from Last 3  Encounters:  08/20/17 219 lb 12.8 oz (99.7 kg)  11/18/16 229 lb (103.9 kg)  07/20/16 231 lb (104.8 kg)   Education/Counseling: yes diet and exercise yes prevention of chronic diseases yes smoking/tobacco cessation yes review "Covered Medicare Preventive Services"  Last PSA was on 4/26 with Dr. Rosana Hoes with urology.  Assessment and Plan: 1. Medicare annual wellness visit, subsequent - Pt presents for annual wellness exam. He is feeling well today. Pt needs pneumococcal. He declines this today. Information about the vaccine discussed and printed out. Blood pressure is controlled - con't current dose of medications. RTC in one year for annual exam. He will need colonoscopy in 2020.  Routine labs pending - will contact with results.   2. Screening for endocrine disorder - POCT urinalysis dipstick - CMP14+EGFR  3. Vitamin D deficiency - VITAMIN D 25 Hydroxy (Vit-D Deficiency, Fractures)  4. History of prostate cancer - Sees urology annually. Last PSA 06/22/2016 with Dr. Rosana Hoes.  5. Screening for lipid disorders - Lipid panel  6. Screening for deficiency anemia - CBC with Differential/Platelet  7. Hypertension, unspecified type - amLODipine (NORVASC) 10 MG tablet; Take 1 tablet (10 mg total) by mouth daily.  Dispense: 90 tablet; Refill: 4 - lisinopril-hydrochlorothiazide (PRINZIDE,ZESTORETIC) 20-25 MG tablet; Take 1 tablet by mouth daily.  Dispense: 90 tablet; Refill: 4 - Controlled. Office pressure today is 146/86. He has home blood pressures <130/80. plan to continue current medication doses. Con't checking home blood pressures.  8. Chronic midline low back pain without sciatica - ibuprofen (ADVIL,MOTRIN) 800 MG tablet; TAKE 1 TABLET(800 MG) BY MOUTH EVERY 8 HOURS AS NEEDED  Dispense: 90 tablet; Refill: 0 - HYDROcodone-acetaminophen (NORCO/VICODIN) 5-325 MG tablet; Take 1 tablet by mouth every 6 (six) hours as needed for moderate pain.  Dispense: 20 tablet; Refill: 0 - this is a chronic  problem for this pt. Disability form filled out today.   Mercer Pod, PA-C  [facility] 08/20/2017 11:31 AM

## 2017-08-20 NOTE — Patient Instructions (Addendum)
Calcium-All patients should maintain a daily total calcium intake (diet plus supplement) of 1,000 mg - 1,200 mg. The Upper Level (UL) of intake for calcium in most adults is 2,000 - 2,500 mg daily.  In adults with vitamin D deficiency the Endocrine Society guidelines suggest a maintenance dose of vitamin D2 or D3 (1,500 to 2,000 international units daily) to maintain a blood concentration above 30 ng/mL.   What foods and drinks have vitamin D?-Foods and drinks that have a lot of vitamin D include: -Milk, orange juice, or yogurt with vitamin D added -Cooked salmon or mackerel -Canned tuna fish -Cereals with vitamin D added -Cod liver oil People's bodies can also get vitamin D from the sun. The body uses sunlight that shines on the skin to make vitamin D.  It is important not to take too much vitamin D. Taking too much vitamin D can make you sick.  - Come back and see me if you have concerns about your blood pressure.   - You are due for your next colonoscopy in next year.   - Please consider having your pneumococcal vaccine in the future:   Pneumococcal Conjugate Vaccine suspension for injection What is this medicine? PNEUMOCOCCAL VACCINE (NEU mo KOK al vak SEEN) is a vaccine used to prevent pneumococcus bacterial infections. These bacteria can cause serious infections like pneumonia, meningitis, and blood infections. This vaccine will lower your chance of getting pneumonia. If you do get pneumonia, it can make your symptoms milder and your illness shorter. This vaccine will not treat an infection and will not cause infection. This vaccine is recommended for infants and young children, adults with certain medical conditions, and adults 6 years or older. This medicine may be used for other purposes; ask your health care provider or pharmacist if you have questions. COMMON BRAND NAME(S): Prevnar, Prevnar 13 What should I tell my health care provider before I take this medicine? They need  to know if you have any of these conditions: -bleeding problems -fever -immune system problems -an unusual or allergic reaction to pneumococcal vaccine, diphtheria toxoid, other vaccines, latex, other medicines, foods, dyes, or preservatives -pregnant or trying to get pregnant -breast-feeding How should I use this medicine? This vaccine is for injection into a muscle. It is given by a health care professional. A copy of Vaccine Information Statements will be given before each vaccination. Read this sheet carefully each time. The sheet may change frequently. Talk to your pediatrician regarding the use of this medicine in children. While this drug may be prescribed for children as young as 66 weeks old for selected conditions, precautions do apply. Overdosage: If you think you have taken too much of this medicine contact a poison control center or emergency room at once. NOTE: This medicine is only for you. Do not share this medicine with others. What if I miss a dose? It is important not to miss your dose. Call your doctor or health care professional if you are unable to keep an appointment. What may interact with this medicine? -medicines for cancer chemotherapy -medicines that suppress your immune function -steroid medicines like prednisone or cortisone This list may not describe all possible interactions. Give your health care provider a list of all the medicines, herbs, non-prescription drugs, or dietary supplements you use. Also tell them if you smoke, drink alcohol, or use illegal drugs. Some items may interact with your medicine. What should I watch for while using this medicine? Mild fever and pain should go away  in 3 days or less. Report any unusual symptoms to your doctor or health care professional. What side effects may I notice from receiving this medicine? Side effects that you should report to your doctor or health care professional as soon as possible: -allergic reactions like  skin rash, itching or hives, swelling of the face, lips, or tongue -breathing problems -confused -fast or irregular heartbeat -fever over 102 degrees F -seizures -unusual bleeding or bruising -unusual muscle weakness Side effects that usually do not require medical attention (report to your doctor or health care professional if they continue or are bothersome): -aches and pains -diarrhea -fever of 102 degrees F or less -headache -irritable -loss of appetite -pain, tender at site where injected -trouble sleeping This list may not describe all possible side effects. Call your doctor for medical advice about side effects. You may report side effects to FDA at 1-800-FDA-1088. Where should I keep my medicine? This does not apply. This vaccine is given in a clinic, pharmacy, doctor's office, or other health care setting and will not be stored at home. NOTE: This sheet is a summary. It may not cover all possible information. If you have questions about this medicine, talk to your doctor, pharmacist, or health care provider.  2018 Elsevier/Gold Standard (2013-12-21 10:27:27)   IF you received an x-ray today, you will receive an invoice from [facility]. Please contact [facility] at [phone] with questions or concerns regarding your invoice.   IF you received labwork today, you will receive an invoice from [facility]. Please contact [facility] at [phone] with questions or concerns regarding your invoice.   Our billing staff will not be able to assist you with questions regarding bills from these companies.  You will be contacted with the lab results as soon as they are available. The fastest way to get your results is to activate your My Chart account. Instructions are located on the last page of this paperwork. If you have not heard from [place] regarding the results in 2 weeks, please contact this office.

## 2017-08-21 LAB — CMP14+EGFR
ALT: 15 IU/L (ref 0–44)
AST: 23 IU/L (ref 0–40)
Albumin/Globulin Ratio: 1.7 (ref 1.2–2.2)
Albumin: 4.6 g/dL (ref 3.6–4.8)
Alkaline Phosphatase: 61 IU/L (ref 39–117)
BUN/Creatinine Ratio: 13 (ref 10–24)
BUN: 11 mg/dL (ref 8–27)
Bilirubin Total: 0.4 mg/dL (ref 0.0–1.2)
CO2: 24 mmol/L (ref 20–29)
Calcium: 9.8 mg/dL (ref 8.6–10.2)
Chloride: 100 mmol/L (ref 96–106)
Creatinine, Ser: 0.86 mg/dL (ref 0.76–1.27)
GFR calc Af Amer: 105 mL/min/{1.73_m2} (ref >59)
GFR calc non Af Amer: 91 mL/min/{1.73_m2} (ref >59)
Globulin, Total: 2.7 g/dL (ref 1.5–4.5)
Glucose: 109 mg/dL — ABNORMAL HIGH (ref 65–99)
Potassium: 3.6 mmol/L (ref 3.5–5.2)
Sodium: 140 mmol/L (ref 134–144)
Total Protein: 7.3 g/dL (ref 6.0–8.5)

## 2017-08-21 LAB — LIPID PANEL
Chol/HDL Ratio: 2.7 ratio (ref 0.0–5.0)
Cholesterol, Total: 165 mg/dL (ref 100–199)
HDL: 61 mg/dL (ref >39)
LDL Calculated: 88 mg/dL (ref 0–99)
Triglycerides: 81 mg/dL (ref 0–149)
VLDL Cholesterol Cal: 16 mg/dL (ref 5–40)

## 2017-08-21 LAB — CBC WITH DIFFERENTIAL/PLATELET
Basophils Absolute: 0 10*3/uL (ref 0.0–0.2)
Basos: 1 %
EOS (ABSOLUTE): 0 10*3/uL (ref 0.0–0.4)
Eos: 1 %
Hematocrit: 44 % (ref 37.5–51.0)
Hemoglobin: 14.2 g/dL (ref 13.0–17.7)
Immature Grans (Abs): 0 10*3/uL (ref 0.0–0.1)
Immature Granulocytes: 0 %
Lymphocytes Absolute: 1.7 10*3/uL (ref 0.7–3.1)
Lymphs: 35 %
MCH: 27.4 pg (ref 26.6–33.0)
MCHC: 32.3 g/dL (ref 31.5–35.7)
MCV: 85 fL (ref 79–97)
Monocytes Absolute: 0.3 10*3/uL (ref 0.1–0.9)
Monocytes: 6 %
Neutrophils Absolute: 2.9 10*3/uL (ref 1.4–7.0)
Neutrophils: 57 %
Platelets: 242 10*3/uL (ref 150–450)
RBC: 5.19 x10E6/uL (ref 4.14–5.80)
RDW: 14.4 % (ref 12.3–15.4)
WBC: 4.9 10*3/uL (ref 3.4–10.8)

## 2017-08-21 LAB — VITAMIN D 25 HYDROXY (VIT D DEFICIENCY, FRACTURES): Vit D, 25-Hydroxy: 33.2 ng/mL (ref 30.0–100.0)

## 2017-08-26 ENCOUNTER — Telehealth: Payer: Self-pay | Admitting: Physician Assistant

## 2017-08-26 NOTE — Telephone Encounter (Unsigned)
Copied from [place] [id number]. Topic: Quick Communication - Rx Refill/Question >> Aug 26, 2017  1:23 PM Neva Seat wrote: amLODipine (NORVASC) 10 MG tablet  Pharmacy told pt he cannot get the Rx filled until June 17.  Pt is now out.  Needing Rx for bp.  [facility] [place], [place] [address] [facility] [address] [place] [address] Phone: [phone] Fax: [phone] Not a 24 hour pharmacy; exact hours not known

## 2017-08-27 NOTE — Telephone Encounter (Signed)
Call to pharmacy: patient picked up prescription on 4/10- 90 day supply.  Cash price for 1 month- $58  Call to patient to see why he is out- left message to call back

## 2017-08-27 NOTE — Telephone Encounter (Signed)
Pt states that he is out of medication. He went out of town and left his medication. He said pharmacy told him he cannot fill until 09/13/17. Can we override for lost medication to have it filled early?  Call back [phone]

## 2017-08-30 NOTE — Telephone Encounter (Signed)
Pt advised to go to pharmacy for emergency fill.

## 2017-09-04 ENCOUNTER — Other Ambulatory Visit: Payer: Self-pay | Admitting: Physician Assistant

## 2017-09-04 DIAGNOSIS — G8929 Other chronic pain: Secondary | ICD-10-CM

## 2017-09-04 DIAGNOSIS — M545 Low back pain: Principal | ICD-10-CM

## 2017-09-04 NOTE — Telephone Encounter (Signed)
Pt. Calling to request replacement for print script of hydrocodone, asserts he has lost it since visit with PA.

## 2017-09-06 ENCOUNTER — Other Ambulatory Visit: Payer: Self-pay | Admitting: Physical Medicine and Rehabilitation

## 2017-09-06 DIAGNOSIS — M545 Low back pain, unspecified: Secondary | ICD-10-CM

## 2017-09-06 NOTE — Telephone Encounter (Signed)
LOV  08/20/17 Whitney McVey Last rx  08/20/17  Pt. Reports he lost written rx.

## 2017-09-06 NOTE — Telephone Encounter (Signed)
Patient called again to request refill on his HYDROcodone-acetaminophen (NORCO/VICODIN) 5-325 MG tablet  Because he stated he lost the original prescription.

## 2017-09-07 NOTE — Telephone Encounter (Signed)
Provider, please advise.  

## 2017-09-08 ENCOUNTER — Other Ambulatory Visit: Payer: Self-pay | Admitting: Physician Assistant

## 2017-09-08 ENCOUNTER — Ambulatory Visit
Admission: RE | Admit: 2017-09-08 | Discharge: 2017-09-08 | Disposition: A | Discharge status: Home / Self Care | Payer: Medicare Other | Source: Ambulatory Visit | Attending: Physical Medicine and Rehabilitation | Admitting: Physical Medicine and Rehabilitation

## 2017-09-08 DIAGNOSIS — M545 Low back pain, unspecified: Secondary | ICD-10-CM

## 2017-09-08 DIAGNOSIS — G8929 Other chronic pain: Secondary | ICD-10-CM

## 2017-09-08 NOTE — Telephone Encounter (Unsigned)
Copied from [place] [phone]. Topic: Quick Communication - See Telephone Encounter >> Sep 08, 2017  7:26 AM Marja Kays F wrote: Pt has misplaced his hydrocodone rx that was given at last visit   Best number [phone]  ok to leave a message   [address] -phone [phone]

## 2017-09-08 NOTE — Telephone Encounter (Signed)
Message rec'd that pt. misplaced Hydrocodone Rx given at last visit.; last Rx 08/20/17; #20; no refills  Last office visit: 08/20/17  PCP: [place]: [place] on [address].

## 2017-09-08 NOTE — Telephone Encounter (Signed)
Provider, please refill if appropriate.

## 2017-09-08 NOTE — Telephone Encounter (Signed)
Copied from [place] [phone]. Topic: Quick Communication - See Telephone Encounter >> Sep 08, 2017  7:26 AM Marja Kays F wrote: Pt has misplaced his hydrocodone rx that was given at last visit   Best number [phone]  ok to leave a message   [address] -phone [phone]  >> Sep 08, 2017  4:48 PM Bea Graff, NT wrote: Pt calling to check status to see if the pain medication will be called in for him.

## 2017-09-09 ENCOUNTER — Other Ambulatory Visit: Payer: Self-pay

## 2017-09-09 NOTE — Telephone Encounter (Signed)
Pt. Calling again.  He is asking if nurse or doctor will call him.  He is in pain and needing help.

## 2017-09-09 NOTE — Telephone Encounter (Signed)
Phone call to patient. He states he has been calling since Saturday for replacement Norco prescription. Patient states he lost medication because he misplaced it in his home - this happens with his possessions from time to time. He states the last time he took prescription was on Saturday.   Reviewed ED visit from this weekend. He states the prednisone and gabapentin he was given was not helpful for his pain. He is hoping Whitney can send a new prescription for him so he can get some relief.   Norco pended.  Provider, please refill/deny medication.

## 2017-09-10 ENCOUNTER — Telehealth: Payer: Self-pay

## 2017-09-10 ENCOUNTER — Other Ambulatory Visit: Payer: Self-pay | Admitting: Physical Medicine and Rehabilitation

## 2017-09-10 DIAGNOSIS — M5416 Radiculopathy, lumbar region: Secondary | ICD-10-CM

## 2017-09-10 NOTE — Telephone Encounter (Signed)
Pt states the he lost his hyrdocodone rx. Please advise.

## 2017-09-10 NOTE — Telephone Encounter (Signed)
Patient calling back again to check on the status of resending hydrocodone. He's been calling since 06/08 about this. Please approve or deny script. [facility] [address] -phone [phone]

## 2017-09-14 ENCOUNTER — Ambulatory Visit
Admission: RE | Admit: 2017-09-14 | Discharge: 2017-09-14 | Disposition: A | Discharge status: Home / Self Care | Payer: Medicare Other | Source: Ambulatory Visit | Attending: Physical Medicine and Rehabilitation | Admitting: Physical Medicine and Rehabilitation

## 2017-09-14 ENCOUNTER — Other Ambulatory Visit: Payer: Self-pay | Admitting: Physical Medicine and Rehabilitation

## 2017-09-14 ENCOUNTER — Other Ambulatory Visit: Payer: Self-pay | Admitting: Physician Assistant

## 2017-09-14 DIAGNOSIS — M545 Low back pain, unspecified: Secondary | ICD-10-CM

## 2017-09-14 DIAGNOSIS — M5416 Radiculopathy, lumbar region: Secondary | ICD-10-CM

## 2017-09-14 DIAGNOSIS — G8929 Other chronic pain: Secondary | ICD-10-CM

## 2017-09-14 MED ORDER — METHYLPREDNISOLONE ACETATE 40 MG/ML INJ SUSP (RADIOLOG
120.0000 mg | Freq: Once | INTRAMUSCULAR | Status: AC
  Administered 2017-09-14: 120 mg via EPIDURAL

## 2017-09-14 MED ORDER — HYDROCODONE-ACETAMINOPHEN 5-325 MG PO TABS: 1.0000 | ORAL_TABLET | Freq: Four times a day (QID) | ORAL | 0 refills | Status: DC | PRN

## 2017-09-14 MED ORDER — IOPAMIDOL (ISOVUE-M 200) INJECTION 41%
1.0000 mL | Freq: Once | INTRAMUSCULAR | Status: AC
  Administered 2017-09-14: 1 mL via EPIDURAL

## 2017-09-14 NOTE — Discharge Instructions (Signed)

## 2017-09-14 NOTE — Telephone Encounter (Signed)
Rx sent to pharmacy. Thank you

## 2017-09-20 ENCOUNTER — Encounter: Payer: Self-pay | Admitting: Physician Assistant

## 2017-09-22 ENCOUNTER — Other Ambulatory Visit: Payer: Self-pay | Admitting: Physician Assistant

## 2017-09-22 ENCOUNTER — Other Ambulatory Visit: Payer: Self-pay

## 2017-09-22 DIAGNOSIS — G8929 Other chronic pain: Secondary | ICD-10-CM

## 2017-09-22 DIAGNOSIS — M545 Low back pain: Principal | ICD-10-CM

## 2017-09-22 NOTE — Telephone Encounter (Signed)
Copied from [place] [phone]. Topic: Quick Communication - Rx Refill/Question >> Sep 22, 2017  5:42 PM Selinda Flavin B, NT wrote: Medication: cyclobenzaprine (FLEXERIL) 5 MG tablet  Has the patient contacted their pharmacy? Yes.   (Agent: If no, request that the patient contact the pharmacy for the refill.) (Agent: If yes, when and what did the pharmacy advise?)  Preferred Pharmacy (with phone number or street name): [id number] - [place], [address]  Agent: Please be advised that RX refills may take up to 3 business days. We ask that you follow-up with your pharmacy.

## 2017-09-23 NOTE — Telephone Encounter (Signed)
Flexeril refill Last Refill:02/09/17 #30 Last OV: 08/20/17 PCP: [place]: [facility] [place]

## 2017-09-27 MED ORDER — CYCLOBENZAPRINE HCL 5 MG PO TABS: ORAL_TABLET | ORAL | 0 refills | Status: DC

## 2017-09-28 ENCOUNTER — Telehealth: Payer: Self-pay | Admitting: Physician Assistant

## 2017-09-28 NOTE — Telephone Encounter (Signed)
Copied from [place] [phone]. Topic: General - Other >> Sep 28, 2017  4:01 PM Carolyn Stare wrote:  Pt call to ask why he only receive 30 pills of the below med    cyclobenzaprine (FLEXERIL) 5 MG tablet  [phone]    >> Sep 28, 2017  4:06 PM Carolyn Stare wrote:   Calla Kicks also said he ise to take 10mg  and is asking why it is only 5mg 

## 2017-11-10 ENCOUNTER — Other Ambulatory Visit: Payer: Self-pay | Admitting: Physician Assistant

## 2017-11-10 DIAGNOSIS — M545 Low back pain: Principal | ICD-10-CM

## 2017-11-10 DIAGNOSIS — G8929 Other chronic pain: Secondary | ICD-10-CM

## 2017-11-10 NOTE — Telephone Encounter (Signed)
Patient called and he has questions about the medication cyclobenzaprine (FLEXERIL) 5 MG tablet. He states he was getting a 90 day supply and the dosage as 10 mg. He states now he only get 30 day supply and his dosage is 5 mg. He is wanting to know why. Please call CB# [phone]

## 2017-11-10 NOTE — Telephone Encounter (Signed)
cyclobenzaprine refill Last Refill:09/27/17 # 30 no RF Last OV: 08/20/17 PCP: Loree Fee McVey PA [place].  See attached note

## 2017-11-15 NOTE — Telephone Encounter (Signed)
pls advise

## 2017-11-17 ENCOUNTER — Encounter: Payer: Self-pay | Admitting: Physician Assistant

## 2017-11-17 ENCOUNTER — Other Ambulatory Visit: Payer: Self-pay

## 2017-11-17 ENCOUNTER — Ambulatory Visit: Payer: Medicare Other | Admitting: Physician Assistant

## 2017-11-17 VITALS — BP 163/84 | HR 104 | Temp 97.9°F | Resp 18 | Ht 70.0 in | Wt 210.2 lb

## 2017-11-17 DIAGNOSIS — G8929 Other chronic pain: Secondary | ICD-10-CM

## 2017-11-17 DIAGNOSIS — M79645 Pain in left finger(s): Secondary | ICD-10-CM | POA: Diagnosis not present

## 2017-11-17 DIAGNOSIS — M545 Low back pain, unspecified: Secondary | ICD-10-CM

## 2017-11-17 DIAGNOSIS — G479 Sleep disorder, unspecified: Secondary | ICD-10-CM | POA: Diagnosis not present

## 2017-11-17 MED ORDER — HYDROXYZINE HCL 50 MG PO TABS: ORAL_TABLET | ORAL | 0 refills | Status: DC

## 2017-11-17 MED ORDER — CYCLOBENZAPRINE HCL 10 MG PO TABS: ORAL_TABLET | ORAL | 1 refills | Status: DC

## 2017-11-17 NOTE — Patient Instructions (Addendum)
  For sleep. Cut the TV off 1 hour before you go to bed. Start taking Hydroxyzine 50-100 mg 30 min before bedtime as needed to help you sleep. (This may help your allergies as well. If not, take over-the-counter Zyrtec, claritin or flonase) Call and leave a message for me to let me know if hydroxyzine is working to help you sleep. If not, I will try another medication,   For [facility] [place], [place] [address] [phone] Mon-Fri  5:30PM - 9PM   IF you received an x-ray today, you will receive an invoice from [facility]. Please contact [facility] Radiology at [phone] with questions or concerns regarding your invoice.   IF you received labwork today, you will receive an invoice from [place]. Please contact [facility] at [phone] with questions or concerns regarding your invoice.   Our billing staff will not be able to assist you with questions regarding bills from these companies.  You will be contacted with the lab results as soon as they are available. The fastest way to get your results is to activate your My Chart account. Instructions are located on the last page of this paperwork. If you have not heard from [place] regarding the results in 2 weeks, please contact this office.

## 2017-11-17 NOTE — Progress Notes (Signed)
Jason Valencia  MRN: [id number] DOB: [birth year]  PCP: Dorise Hiss, PA-C  Subjective:  Pt is a pleasant 67 year old male who presents to clinic for several complaints.   1) insomnia.  He has a difficult time falling asleep.  "I'll yawn and be all sleepy than lay down to go to sleep and cannot fall asleep and I just lay there for hours" he has tried hot tea, melatonin, hot shower. He exercises regularly.  He has not had a problem with this before.  2) L thumb pain x a few weeks.  He dropped a pallet of wood on his right thumb.  "It has not been right since" he is able to bend it.  Denies numbness, tingling, weakness, discoloration.  3) Allergies -have been having the sniffles, runny nose.  He has never had a problem with seasonal allergies before he does not take anything for this.  4) would like a refill of his Flexeril 10 mg.  Review of Systems  Constitutional: Negative for chills and fever.  HENT: Positive for rhinorrhea. Negative for congestion, postnasal drip, sinus pressure, sinus pain, sneezing and sore throat.   Musculoskeletal: Positive for arthralgias (Left thumb). Negative for joint swelling.  Skin: Negative.   Psychiatric/Behavioral: Positive for sleep disturbance.    Patient Active Problem List   Diagnosis Date Noted  . SUI (stress urinary incontinence), male 12/14/2015  . 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 History of prostate cancer 02/13/2013  . HTN (hypertension) 10/08/2011  . Vitamin D deficiency 10/08/2011  . Chronic pain due to trauma 10/08/2011    Current Outpatient Medications on File Prior to Visit  Medication Sig Dispense Refill  . amLODipine (NORVASC) 10 MG tablet Take 1 tablet (10 mg total) by mouth daily. 90 tablet 4  . cyclobenzaprine (FLEXERIL) 5 MG tablet 1 pill by mouth up to every 8 hours as needed. Start with one pill by mouth each bedtime as needed due to sedation 90 tablet 0  . cyclobenzaprine (FLEXERIL) 5 MG tablet TAKE 1  TABLET BY MOUTH EVERY 8 HOURS AS NEEDED. START WITH 1 TABLET AT BEDTIME AS NEEDED DUE TO SEDATION 30 tablet 0  . HYDROcodone-acetaminophen (NORCO/VICODIN) 5-325 MG tablet Take 1 tablet by mouth every 6 (six) hours as needed for moderate pain. 20 tablet 0  . ibuprofen (ADVIL,MOTRIN) 800 MG tablet Take 1 tablet (800 mg total) by mouth every 8 (eight) hours as needed. 90 tablet 0  . ibuprofen (ADVIL,MOTRIN) 800 MG tablet TAKE 1 TABLET(800 MG) BY MOUTH EVERY 8 HOURS AS NEEDED 90 tablet 0  . lisinopril-hydrochlorothiazide (PRINZIDE,ZESTORETIC) 20-25 MG tablet Take 1 tablet by mouth daily. 90 tablet 4  . methocarbamol (ROBAXIN-750) 750 MG tablet Take 1 tablet (750 mg total) by mouth 4 (four) times daily. 40 tablet 3  . sildenafil (REVATIO) 20 MG tablet Take 1 tablet (20 mg total) by mouth 3 (three) times daily. 60 tablet 6   No current facility-administered medications on file prior to visit.     No Known Allergies   Objective:  BP (!) 163/84   Pulse (!) 104   Temp 97.9 F (36.6 C) (Oral)   Resp 18   Ht 5\' 10"  (1.778 m)   Wt 210 lb 3.2 oz (95.3 kg)   SpO2 98%   BMI 30.16 kg/m   Physical Exam  Constitutional: He is oriented to person, place, and time. He appears well-developed and well-nourished.  Cardiovascular: Normal rate and regular rhythm.  Musculoskeletal:  Left hand: He exhibits tenderness and bony tenderness. He exhibits normal range of motion. Normal sensation noted. Normal strength noted.       Hands: Neurological: He is alert and oriented to person, place, and time.  Skin: Skin is warm and dry.  Psychiatric: He has a normal mood and affect. His behavior is normal. Judgment and thought content normal.  Vitals reviewed.   Assessment and Plan :  1. Difficulty sleeping -Patient presents complaining of difficulty falling asleep.  He has tried melatonin which has not worked.  Discussed proper sleep hygiene.  Will try hydroxyzine.  Patient will call and let me know if this is  working well for him. - hydrOXYzine (ATARAX/VISTARIL) 50 MG tablet; Take 50-100 mg 30 min before bedtime as needed  Dispense: 60 tablet; Refill: 0  2. Chronic midline low back pain without sciatica -Okay to refill Flexeril. - cyclobenzaprine (FLEXERIL) 10 MG tablet; 1 pill by mouth up to every 8 hours as needed. Start with one pill by mouth each bedtime as needed due to sedation  Dispense: 90 tablet; Refill: 1  3. Pain of left thumb -Patient complains of left thumb pain for a few weeks.  We do not have an x-ray tech here today.  Physical exam concerning for dislocated thumb at the MCP joint.  Advised patient to go to the [facility] orthopedic clinic for evaluation and treatment.  Address and phone number printed out for patient.  He understands and agrees.   Mercer Pod, PA-C  [facility] 11/17/2017 5:34 PM  Please note: Portions of this report may have been transcribed using dragon voice recognition software. Every effort was made to ensure accuracy; however, inadvertent computerized transcription errors may be present.

## 2017-11-19 ENCOUNTER — Ambulatory Visit: Payer: Medicare Other | Admitting: Physician Assistant

## 2017-11-20 ENCOUNTER — Telehealth: Payer: Self-pay | Admitting: Physician Assistant

## 2017-11-20 NOTE — Telephone Encounter (Signed)
Pt is calling stating that [facility] talked about sending him to [facility] for [facility] thumb but there isn't a referral in there.  If it's necessary, please place referral.  Thank you!

## 2017-11-22 ENCOUNTER — Other Ambulatory Visit: Payer: Self-pay | Admitting: Physician Assistant

## 2017-11-22 DIAGNOSIS — M79645 Pain in left finger(s): Secondary | ICD-10-CM

## 2017-11-22 NOTE — Telephone Encounter (Signed)
He was supposed to make this appointment himself. But I will place referral now.

## 2017-11-23 NOTE — Telephone Encounter (Signed)
Informed pt about referral being placed and to give [place] a call if he has anymore questions.

## 2017-12-07 ENCOUNTER — Ambulatory Visit (INDEPENDENT_AMBULATORY_CARE_PROVIDER_SITE_OTHER): Payer: Medicare Other | Admitting: Orthopaedic Surgery

## 2018-01-10 ENCOUNTER — Ambulatory Visit (INDEPENDENT_AMBULATORY_CARE_PROVIDER_SITE_OTHER): Payer: Self-pay

## 2018-01-10 ENCOUNTER — Telehealth (INDEPENDENT_AMBULATORY_CARE_PROVIDER_SITE_OTHER): Payer: Self-pay | Admitting: Orthopaedic Surgery

## 2018-01-10 ENCOUNTER — Ambulatory Visit (INDEPENDENT_AMBULATORY_CARE_PROVIDER_SITE_OTHER): Payer: Medicare Other | Admitting: Orthopaedic Surgery

## 2018-01-10 ENCOUNTER — Encounter (INDEPENDENT_AMBULATORY_CARE_PROVIDER_SITE_OTHER): Payer: Self-pay | Admitting: Orthopaedic Surgery

## 2018-01-10 VITALS — BP 144/87 | HR 92 | Resp 16 | Ht 72.0 in | Wt 215.0 lb

## 2018-01-10 DIAGNOSIS — G8929 Other chronic pain: Secondary | ICD-10-CM

## 2018-01-10 DIAGNOSIS — M79645 Pain in left finger(s): Secondary | ICD-10-CM

## 2018-01-10 NOTE — Telephone Encounter (Signed)
Patient left a voicemail stating he brought his prescription for a splint to [facility], but he was told they no longer carry that splint anymore.  Patient is requesting a return call.

## 2018-01-10 NOTE — Telephone Encounter (Signed)
LMOM for patient to call [facility]

## 2018-01-10 NOTE — Progress Notes (Signed)
Office Visit Note   Patient: Jason Valencia           Date of Birth: Oct 08, 1950           MRN: [id number] Visit Date: 01/10/2018              Requested by: Dorise Hiss, PA-C [place], [place] [id number] PCP: Dorise Hiss, PA-C   Assessment & Plan: Visit Diagnoses:  1. Chronic pain of left thumb     Plan: Collateral ligament injury radially and probable capsular injury to left thumb metacarpal phalangeal joint.  Long discussion regarding diagnosis and treatment options including surgery.  Jason Valencia would like to try a splint monitor its effectiveness before considering any further treatment. Will, send to [facility]  Follow-Up Instructions: No follow-ups on file.   Orders:  Orders Placed This Encounter  Procedures  . XR Finger Thumb Left   No orders of the defined types were placed in this encounter.     Procedures: No procedures performed   Clinical Data: No additional findings.   Subjective: Chief Complaint  Patient presents with  . Left Thumb - Pain  Jason Valencia strictures all of his job is evaluation of an injury sustained 1 month ago to his left thumb.  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  Since that time is had some pain and swelling and what appears to be a "deformity".  No numbness or tingling.  No problem with his thumb prior to that injury  HPI  Review of Systems  Constitutional: Negative for chills, fatigue and fever.  HENT: Negative for mouth sores, sore throat and tinnitus.   Eyes: Negative for pain and itching.  Respiratory: Negative for cough, shortness of breath and wheezing.   Cardiovascular: Negative for chest pain.  Gastrointestinal: Negative for abdominal pain, constipation, diarrhea and nausea.  Endocrine: Negative for polyuria.  Genitourinary: Negative for discharge and penile pain.  Musculoskeletal: Negative for back pain, joint  swelling and neck pain.  Skin: Negative for rash.  Allergic/Immunologic: Negative for immunocompromised state.  Neurological: Negative for dizziness, numbness and headaches.  Hematological: Does not bruise/bleed easily.  Psychiatric/Behavioral: Negative for confusion. The patient is not nervous/anxious.      Objective: Vital Signs: BP (!) 144/87 (BP Location: Right Arm, Patient Position: Sitting, Cuff Size: Normal)   Pulse 92   Resp 16   Ht 6' (1.829 m)   Wt 215 lb (97.5 kg)   BMI 29.16 kg/m   Physical Exam  Constitutional: He is oriented to person, place, and time. He appears well-developed and well-nourished.  HENT:  Mouth/Throat: Oropharynx is clear and moist.  Eyes: Pupils are equal, round, and reactive to light. EOM are normal.  Pulmonary/Chest: Effort normal.  Neurological: He is alert and oriented to person, place, and time.  Skin: Skin is warm and dry.  Psychiatric: He has a normal mood and affect. His behavior is normal.    Ortho Exam awake alert and oriented x3.  Comfortable sitting.  Examination of the left nondominant thumb reveals some instability at the thumb metacarpal phalangeal joint.  There appears to be instability radially opening up with an ulnarly directed force.  Skin intact.  Joint appears to be in place.  Neurologically intact.  Specialty Comments:  No specialty comments available.  Imaging: No results found.   PMFS History: Patient Active Problem List   Diagnosis Date  Noted  . Difficulty sleeping 11/17/2017  . SUI (stress urinary incontinence), male 12/14/2015  . 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 History of prostate cancer 02/13/2013  . HTN (hypertension) 10/08/2011  . Vitamin D deficiency 10/08/2011  . Chronic pain due to trauma 10/08/2011   Past Medical History:  Diagnosis Date  . Cancer ([place])   . Hypertension     Family History  Problem Relation Age of Onset  . Cancer Mother        ovarian  . Heart disease Father   .  Cancer Sister        throat     Past Surgical History:  Procedure Laterality Date  . PROSTATE SURGERY     Social History   Occupational History  . Not on file  Tobacco Use  . Smoking status: Never Smoker  . Smokeless tobacco: Never Used  Substance and Sexual Activity  . Alcohol use: Yes    Alcohol/week: 2.0 standard drinks    Types: 2 Standard drinks or equivalent per week  . Drug use: No  . Sexual activity: Not on file

## 2018-01-12 ENCOUNTER — Telehealth: Payer: Self-pay | Admitting: Physician Assistant

## 2018-01-12 NOTE — Telephone Encounter (Signed)
Copied from [place] [id number]. Topic: General - Other >> Jan 12, 2018  4:56 PM Valla Leaver wrote: Reason for CRM: Patient faxing disability sticker form for McVey to fill out and return. Patient would like a call to confirm receipt.

## 2018-01-18 ENCOUNTER — Telehealth (INDEPENDENT_AMBULATORY_CARE_PROVIDER_SITE_OTHER): Payer: Self-pay | Admitting: Orthopaedic Surgery

## 2018-01-18 NOTE — Telephone Encounter (Signed)
Physical Therapist requesting OP note, and most recent office note where doctor is prescribing PT. Doctor wants a splint made, and they need info.  Patient has an appt today.Fax# [phone].

## 2018-01-18 NOTE — Telephone Encounter (Signed)
Office note faxed.

## 2018-01-28 ENCOUNTER — Other Ambulatory Visit (INDEPENDENT_AMBULATORY_CARE_PROVIDER_SITE_OTHER): Payer: Self-pay | Admitting: Radiology

## 2018-01-28 ENCOUNTER — Telehealth (INDEPENDENT_AMBULATORY_CARE_PROVIDER_SITE_OTHER): Payer: Self-pay | Admitting: Orthopaedic Surgery

## 2018-01-28 DIAGNOSIS — G8929 Other chronic pain: Secondary | ICD-10-CM

## 2018-01-28 DIAGNOSIS — M79645 Pain in left finger(s): Principal | ICD-10-CM

## 2018-01-28 NOTE — Telephone Encounter (Signed)
Please advise I didn't see anything in your last note in regards to OT

## 2018-01-28 NOTE — Telephone Encounter (Signed)
Beth, physical therapist from [facility] left a voicemail stating patient is scheduled with OT for his thumb. Beth states they do not have a prescription on file for the patient.  Please fax to [phone]

## 2018-01-28 NOTE — Telephone Encounter (Signed)
Call [facility] and find out who sent referral-if we did then send formal referral

## 2018-01-28 NOTE — Telephone Encounter (Signed)
Faxed referral

## 2018-03-14 ENCOUNTER — Telehealth: Payer: Self-pay | Admitting: Physician Assistant

## 2018-03-14 NOTE — Telephone Encounter (Signed)
Pt calling to check the status of disability papers that were faxed over about 3 weeks ago. He would like a call back please.

## 2018-03-14 NOTE — Telephone Encounter (Signed)
We have not had any paperwork for this patient and the patient has not been seen here since May 2019

## 2018-03-21 ENCOUNTER — Telehealth: Payer: Self-pay | Admitting: Physician Assistant

## 2018-03-21 NOTE — Telephone Encounter (Signed)
Pt states that [facility] is not seeing him for his back but seeing him for his thumb. He states he has been seeing [facility] for 2-3 years for his back.

## 2018-03-21 NOTE — Telephone Encounter (Signed)
Patient brought paperwork into the clinic on Saturday 03/19/18 needing McVey to complete it for him. I stated she would not be here until 03/24/18 and I would have to ask her if she would complete since he had not been since in 43months. Patient stated understanding and left.  After looking at the forms patient brought in it states that he was treated on 01/11/18. After looking at this chart he was seen by Dr Durward Fortes at [facility], so the forms need to go to this Ortho provider to be completed.  We are not treating him and we cannot complete the forms.

## 2018-03-21 NOTE — Telephone Encounter (Signed)
Patient will have to make an appointment with a provider here to be seen to have forms completed as Loree Fee is no longer here.

## 2018-04-18 ENCOUNTER — Encounter: Payer: Self-pay | Admitting: Emergency Medicine

## 2018-04-18 ENCOUNTER — Ambulatory Visit (INDEPENDENT_AMBULATORY_CARE_PROVIDER_SITE_OTHER): Payer: Medicare Other | Admitting: Emergency Medicine

## 2018-04-18 ENCOUNTER — Other Ambulatory Visit: Payer: Self-pay

## 2018-04-18 VITALS — BP 153/76 | HR 91 | Temp 97.5°F | Resp 20 | Ht 71.06 in | Wt 225.4 lb

## 2018-04-18 DIAGNOSIS — M545 Low back pain, unspecified: Secondary | ICD-10-CM

## 2018-04-18 DIAGNOSIS — G8929 Other chronic pain: Secondary | ICD-10-CM | POA: Diagnosis not present

## 2018-04-18 DIAGNOSIS — M48061 Spinal stenosis, lumbar region without neurogenic claudication: Secondary | ICD-10-CM | POA: Diagnosis not present

## 2018-04-18 NOTE — Progress Notes (Signed)
Jason Valencia 68 y.o.   Chief Complaint  Patient presents with  . Paper Work    for back pain disabled paper work    HISTORY OF PRESENT ILLNESS: This is a 68 y.o. male needs a disability paperwork filled out. Has a history of spinal stenosis with chronic back pain.  No new concerns or significant new symptoms.  HPI   Prior to Admission medications   Medication Sig Start Date End Date Taking? Authorizing Provider  amLODipine (NORVASC) 10 MG tablet Take 1 tablet (10 mg total) by mouth daily. 08/20/17  Yes McVey, Gelene Mink, PA-C  cyclobenzaprine (FLEXERIL) 10 MG tablet 1 pill by mouth up to every 8 hours as needed. Start with one pill by mouth each bedtime as needed due to sedation 11/17/17  Yes McVey, Gelene Mink, PA-C  cyclobenzaprine (FLEXERIL) 5 MG tablet TAKE 1 TABLET BY MOUTH EVERY 8 HOURS AS NEEDED. START WITH 1 TABLET AT BEDTIME AS NEEDED DUE TO SEDATION 11/15/17  Yes McVey, Gelene Mink, PA-C  HYDROcodone-acetaminophen (NORCO/VICODIN) 5-325 MG tablet Take 1 tablet by mouth every 6 (six) hours as needed for moderate pain. 09/14/17  Yes McVey, Gelene Mink, PA-C  hydrOXYzine (ATARAX/VISTARIL) 50 MG tablet Take 50-100 mg 30 min before bedtime as needed 11/17/17  Yes McVey, Gelene Mink, PA-C  ibuprofen (ADVIL,MOTRIN) 800 MG tablet Take 1 tablet (800 mg total) by mouth every 8 (eight) hours as needed. 12/14/16  Yes McVey, Gelene Mink, PA-C  ibuprofen (ADVIL,MOTRIN) 800 MG tablet TAKE 1 TABLET(800 MG) BY MOUTH EVERY 8 HOURS AS NEEDED 08/20/17  Yes McVey, Gelene Mink, PA-C  lisinopril-hydrochlorothiazide (PRINZIDE,ZESTORETIC) 20-25 MG tablet Take 1 tablet by mouth daily. 08/20/17  Yes McVey, Gelene Mink, PA-C  methocarbamol (ROBAXIN-750) 750 MG tablet Take 1 tablet (750 mg total) by mouth 4 (four) times daily. Patient taking differently: Take 750 mg by mouth as needed.  11/18/16  Yes McVey, Gelene Mink, PA-C  sildenafil (REVATIO) 20 MG tablet  Take 1 tablet (20 mg total) by mouth 3 (three) times daily. 12/11/14  Yes Guest, Benn Moulder, MD    No Known Allergies  Patient Active Problem List   Diagnosis Date Noted  . Difficulty sleeping 11/17/2017  . SUI (stress urinary incontinence), male 12/14/2015  . 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 History of prostate cancer 02/13/2013  . HTN (hypertension) 10/08/2011  . Vitamin D deficiency 10/08/2011  . Chronic pain due to trauma 10/08/2011    Past Medical History:  Diagnosis Date  . Cancer ([place])   . Hypertension     Past Surgical History:  Procedure Laterality Date  . PROSTATE SURGERY      Social History   Socioeconomic History  . Marital status: Married    Spouse name: Not on file  . Number of children: 2  . Years of education: Not on file  . Highest education level: Not on file  Occupational History  . Not on file  Social Needs  . Financial resource strain: Not on file  . Food insecurity:    Worry: Not on file    Inability: Not on file  . Transportation needs:    Medical: Not on file    Non-medical: Not on file  Tobacco Use  . Smoking status: Never Smoker  . Smokeless tobacco: Never Used  Substance and Sexual Activity  . Alcohol use: Yes    Alcohol/week: 2.0 standard drinks    Types: 2 Standard drinks or equivalent per week  . Drug use: No  .  Sexual activity: Yes  Lifestyle  . Physical activity:    Days per week: Not on file    Minutes per session: Not on file  . Stress: Not on file  Relationships  . Social connections:    Talks on phone: Not on file    Gets together: Not on file    Attends religious service: Not on file    Active member of club or organization: Not on file    Attends meetings of clubs or organizations: Not on file    Relationship status: Not on file  . Intimate partner violence:    Fear of current or ex partner: Not on file    Emotionally abused: Not on file    Physically abused: Not on file    Forced sexual activity: Not on  file  Other Topics Concern  . Not on file  Social History Narrative  . Not on file    Family History  Problem Relation Age of Onset  . Cancer Mother        ovarian  . Heart disease Father   . Cancer Sister        throat      Review of Systems  Constitutional: Negative.  Negative for chills and fever.  Cardiovascular: Negative.  Negative for chest pain and palpitations.  Gastrointestinal: Negative.  Negative for abdominal pain, blood in stool, diarrhea, nausea and vomiting.  Genitourinary: Negative.  Negative for dysuria and hematuria.  Musculoskeletal: Positive for back pain.  Skin: Negative.  Negative for rash.  Neurological: Negative.  Negative for sensory change, focal weakness and loss of consciousness.  Endo/Heme/Allergies: Negative.   All other systems reviewed and are negative.   Vitals:   04/18/18 1508 04/18/18 1511  BP: (!) 171/93 (!) 153/76  Pulse: 91   Resp: 20   Temp: (!) 97.5 F (36.4 C)   SpO2: 97%     Physical Exam Vitals signs reviewed.  Constitutional:      Appearance: Normal appearance.  HENT:     Head: Normocephalic and atraumatic.     Mouth/Throat:     Mouth: Mucous membranes are moist.     Pharynx: Oropharynx is clear.  Eyes:     Extraocular Movements: Extraocular movements intact.     Pupils: Pupils are equal, round, and reactive to light.  Neck:     Musculoskeletal: Normal range of motion and neck supple.  Cardiovascular:     Rate and Rhythm: Normal rate and regular rhythm.     Heart sounds: Normal heart sounds.  Pulmonary:     Breath sounds: Normal breath sounds.  Abdominal:     Palpations: Abdomen is soft.     Tenderness: There is no abdominal tenderness.  Skin:    General: Skin is warm and dry.  Neurological:     General: No focal deficit present.     Mental Status: He is alert and oriented to person, place, and time.  Psychiatric:        Mood and Affect: Mood normal.        Behavior: Behavior normal.    A total of 25  minutes was spent in the room with the patient, greater than 50% of which was in counseling/coordination of care regarding chronic medical problems, past medical history, paperwork completion for disability purposes and need for follow-up.   ASSESSMENT & PLAN: Jason Valencia was seen today for paper work.  Diagnoses and all orders for this visit:  Spinal stenosis of lumbar region, unspecified whether neurogenic  claudication present  Chronic midline low back pain without sciatica  Chronic bilateral low back pain without sciatica    Patient Instructions       If you have lab work done today you will be contacted with your lab results within the next 2 weeks.  If you have not heard from [place] then please contact us. The fastest way to get your results is to register for My Chart.   IF you received an x-ray today, you will receive an invoice from [facility]. Please contact [facility] at [phone] with questions or concerns regarding your invoice.   IF you received labwork today, you will receive an invoice from [facility]. Please contact [facility] at [phone] with questions or concerns regarding your invoice.   Our billing staff will not be able to assist you with questions regarding bills from these companies.  You will be contacted with the lab results as soon as they are available. The fastest way to get your results is to activate your My Chart account. Instructions are located on the last page of this paperwork. If you have not heard from [place] regarding the results in 2 weeks, please contact this office.     Spinal Stenosis  Spinal stenosis happens when the open space (spinal canal) between the bones of your spine (vertebrae) gets smaller. It is caused by bone pushing into the open spaces of your backbone (spine). This puts pressure on your backbone and the nerves in your backbone. Treatment often focuses on managing any pain and symptoms. In some cases, surgery may  be needed. Follow these instructions at home: Managing pain, stiffness, and swelling   Do all exercises and stretches as told by your doctor.  Stand and sit up straight (use good posture). If you were given a brace or a corset, wear it as told by your doctor.  Do not do any activities that cause pain. Ask your doctor what activities are safe for you.  Do not lift anything that is heavier than 10 lb (4.5 kg) or heavier than your doctor tells you.  Try to stay at a healthy weight. Talk with your doctor if you need help losing weight.  If directed, put heat on the affected area as often as told by your doctor. Use the heat source that your doctor recommends, such as a moist heat pack or a heating pad. ? Put a towel between your skin and the heat source. ? Leave the heat on for 20-30 minutes. ? Remove the heat if your skin turns bright red. This is especially important if you are not able to feel pain, heat, or cold. You may have a greater risk of getting burned. General instructions  Take over-the-counter and prescription medicines only as told by your doctor.  Do not use any products that contain nicotine or tobacco, such as cigarettes and e-cigarettes. If you need help quitting, ask your doctor.  Eat a healthy diet. This includes plenty of fruits and vegetables, whole grains, and low-fat (lean) protein.  Keep all follow-up visits as told by your doctor. This is important. Contact a doctor if:  Your symptoms do not get better.  Your symptoms get worse.  You have a fever. Get help right away if:  You have new or worse pain in your neck or upper back.  You have very bad pain that medicine does not control.  You are dizzy.  You have vision problems, blurred vision, or double vision.  You have a very  bad headache that is worse when you stand.  You feel sick to your stomach (nauseous).  You throw up (vomit).  You have new or worse numbness or tingling in your back or  legs.  You have pain, redness, swelling, or warmth in your arm or leg. Summary  Spinal stenosis happens when the open space (spinal canal) between the bones of your spine gets smaller (narrow).  Contact a doctor if your symptoms get worse.  In some cases, surgery may be needed. This information is not intended to replace advice given to you by your health care provider. Make sure you discuss any questions you have with your health care provider. Document Released: 07/10/2010 Document Revised: 02/19/2016 Document Reviewed: 02/19/2016 Elsevier Interactive Patient Education  2019 Elsevier Inc.      Agustina Caroli, MD [facility]

## 2018-04-18 NOTE — Patient Instructions (Addendum)
If you have lab work done today you will be contacted with your lab results within the next 2 weeks.  If you have not heard from [place] then please contact us. The fastest way to get your results is to register for My Chart.   IF you received an x-ray today, you will receive an invoice from [facility]. Please contact [facility] at [phone] with questions or concerns regarding your invoice.   IF you received labwork today, you will receive an invoice from [facility]. Please contact [facility] at [phone] with questions or concerns regarding your invoice.   Our billing staff will not be able to assist you with questions regarding bills from these companies.  You will be contacted with the lab results as soon as they are available. The fastest way to get your results is to activate your My Chart account. Instructions are located on the last page of this paperwork. If you have not heard from [place] regarding the results in 2 weeks, please contact this office.     Spinal Stenosis  Spinal stenosis happens when the open space (spinal canal) between the bones of your spine (vertebrae) gets smaller. It is caused by bone pushing into the open spaces of your backbone (spine). This puts pressure on your backbone and the nerves in your backbone. Treatment often focuses on managing any pain and symptoms. In some cases, surgery may be needed. Follow these instructions at home: Managing pain, stiffness, and swelling   Do all exercises and stretches as told by your doctor.  Stand and sit up straight (use good posture). If you were given a brace or a corset, wear it as told by your doctor.  Do not do any activities that cause pain. Ask your doctor what activities are safe for you.  Do not lift anything that is heavier than 10 lb (4.5 kg) or heavier than your doctor tells you.  Try to stay at a healthy weight. Talk with your doctor if you need help losing weight.  If directed, put  heat on the affected area as often as told by your doctor. Use the heat source that your doctor recommends, such as a moist heat pack or a heating pad. ? Put a towel between your skin and the heat source. ? Leave the heat on for 20-30 minutes. ? Remove the heat if your skin turns bright red. This is especially important if you are not able to feel pain, heat, or cold. You may have a greater risk of getting burned. General instructions  Take over-the-counter and prescription medicines only as told by your doctor.  Do not use any products that contain nicotine or tobacco, such as cigarettes and e-cigarettes. If you need help quitting, ask your doctor.  Eat a healthy diet. This includes plenty of fruits and vegetables, whole grains, and low-fat (lean) protein.  Keep all follow-up visits as told by your doctor. This is important. Contact a doctor if:  Your symptoms do not get better.  Your symptoms get worse.  You have a fever. Get help right away if:  You have new or worse pain in your neck or upper back.  You have very bad pain that medicine does not control.  You are dizzy.  You have vision problems, blurred vision, or double vision.  You have a very bad headache that is worse when you stand.  You feel sick to your stomach (nauseous).  You throw up (vomit).  You have new or  worse numbness or tingling in your back or legs.  You have pain, redness, swelling, or warmth in your arm or leg. Summary  Spinal stenosis happens when the open space (spinal canal) between the bones of your spine gets smaller (narrow).  Contact a doctor if your symptoms get worse.  In some cases, surgery may be needed. This information is not intended to replace advice given to you by your health care provider. Make sure you discuss any questions you have with your health care provider. Document Released: 07/10/2010 Document Revised: 02/19/2016 Document Reviewed: 02/19/2016 Elsevier Interactive  Patient Education  [employer].

## 2018-05-12 ENCOUNTER — Telehealth: Payer: Self-pay | Admitting: General Practice

## 2018-05-12 NOTE — Telephone Encounter (Signed)
Paperwork was completed and faxed on 05/06/18

## 2018-05-12 NOTE — Telephone Encounter (Signed)
Copied from [place] [phone]. Topic: General - Inquiry >> May 12, 2018  2:16 PM Jeri Cos wrote: Reason for CRM: Pt is inquiring about his disability paperwork being filled out. Pt stated there was just one line the doctor needed to fill out and would like a callback as to when his forms will be completed.

## 2018-05-13 NOTE — Telephone Encounter (Signed)
° ° ° ° °  Pt has been in contact with the company and they did not received the fax that was faxed on 05/06/2018 Pt would like a call back  [phone]

## 2018-05-17 NOTE — Telephone Encounter (Signed)
Pt calling to check the status of his paperwork and he would like to come by and pick it up instead of having it be faxed due to the company saying they havent received the last fax/left a vm with Katlyn  / please advise

## 2018-05-18 NOTE — Telephone Encounter (Signed)
Patient calling back to check on the status of his disability paperwork.  Was not in provider's box.

## 2018-05-18 NOTE — Telephone Encounter (Signed)
Please call pt with status update on this paperwork:   [phone], please leave VM if no answer.

## 2018-05-18 NOTE — Telephone Encounter (Signed)
Attempted to c/b re paperwork.  No answer and v/m full.

## 2018-05-18 NOTE — Telephone Encounter (Signed)
Paperwork has been completed and faxed already and is in the RX pick up box up front for the patient

## 2018-05-20 ENCOUNTER — Telehealth: Payer: Self-pay

## 2018-05-20 NOTE — Telephone Encounter (Signed)
Attempted to call pt to let him know that form is ready to be picked up. There was no answer.  He may come pick up the Handicap placard form at front desk.  Thanks, [employer]

## 2018-06-13 ENCOUNTER — Other Ambulatory Visit: Payer: Self-pay | Admitting: Physician Assistant

## 2018-06-13 DIAGNOSIS — G8929 Other chronic pain: Secondary | ICD-10-CM

## 2018-06-13 DIAGNOSIS — M545 Low back pain: Principal | ICD-10-CM

## 2018-06-13 DIAGNOSIS — G479 Sleep disorder, unspecified: Secondary | ICD-10-CM

## 2018-07-01 ENCOUNTER — Telehealth: Payer: Self-pay | Admitting: Family Medicine

## 2018-07-01 NOTE — Telephone Encounter (Signed)
Left VM to let the patient know that I tried to fax the handicap paperwork but it keeps failing to go through. Wanted to verify the fax number that was given.

## 2018-07-01 NOTE — Telephone Encounter (Signed)
Left VM to let patient know I am faxing his handicap placard paperwork to requested fax number.

## 2018-07-12 ENCOUNTER — Encounter: Payer: Self-pay | Admitting: Gastroenterology

## 2018-07-12 NOTE — Telephone Encounter (Signed)
Please advise 

## 2018-07-12 NOTE — Telephone Encounter (Signed)
Pt called in and the fax number is working now.  [phone] Fax number.  He said if that doesn't work he would like to have it mailed to him

## 2018-07-13 NOTE — Telephone Encounter (Signed)
Sent in the mail to his PO Box due to fax failure.

## 2018-07-13 NOTE — Telephone Encounter (Signed)
Patient called to inform the office that the forms could be mailed to his PO Box address as well, if the fax number still does not work.

## 2018-08-05 ENCOUNTER — Other Ambulatory Visit: Payer: Self-pay | Admitting: Emergency Medicine

## 2018-08-05 ENCOUNTER — Other Ambulatory Visit: Payer: Self-pay | Admitting: Physician Assistant

## 2018-08-05 DIAGNOSIS — I10 Essential (primary) hypertension: Secondary | ICD-10-CM

## 2018-08-05 NOTE — Telephone Encounter (Signed)
Requested medication (s) are due for refill today:  Yes  Requested medication (s) are on the active medication list:   Yes  Future visit scheduled:   No.   Needs a virtual visit for his yearly f/u.  Last saw Dr. Mitchel Honour for paperwork for his job. Normally sees McVey   Last ordered: 08/20/17  #90  4 refills.   Returned for scheduling for his yearly f/u since office is scheduling these visits due to the COVID-19 pandemic.  Requested Prescriptions  Pending Prescriptions Disp Refills   amLODipine (NORVASC) 10 MG tablet [Pharmacy Med Name: AMLODIPINE BESYLATE 10MG  TABLETS] 347 tablet     Sig: TAKE 1 TABLET(10 MG) BY MOUTH DAILY     Cardiovascular:  Calcium Channel Blockers Failed - 08/05/2018  9:24 AM      Failed - Last BP in normal range    BP Readings from Last 1 Encounters:  04/18/18 (!) 153/76         Passed - Valid encounter within last 6 months    Recent Outpatient Visits          3 months ago Spinal stenosis of lumbar region, unspecified whether neurogenic claudication present   [facility] at [place], Ines Bloomer, MD   8 months ago Difficulty sleeping   [facility] at [facility], Gelene Mink, PA-C   11 months ago Medicare annual wellness visit, subsequent   [facility] at [facility], [place], PA-C   1 year ago Chronic midline low back pain without sciatica   Primary Care at [facility], Gelene Mink, PA-C   2 years ago Hypertension, unspecified type   Primary Care at [facility], [place], [place]

## 2018-08-23 ENCOUNTER — Other Ambulatory Visit: Payer: Self-pay | Admitting: Emergency Medicine

## 2018-08-23 ENCOUNTER — Other Ambulatory Visit: Payer: Self-pay | Admitting: *Deleted

## 2018-08-23 ENCOUNTER — Telehealth: Payer: Self-pay | Admitting: Physician Assistant

## 2018-08-23 ENCOUNTER — Other Ambulatory Visit: Payer: Self-pay | Admitting: Physician Assistant

## 2018-08-23 DIAGNOSIS — I10 Essential (primary) hypertension: Secondary | ICD-10-CM

## 2018-08-23 MED ORDER — LISINOPRIL-HYDROCHLOROTHIAZIDE 20-25 MG PO TABS: 1.0000 | ORAL_TABLET | Freq: Every day | ORAL | 0 refills | Status: DC

## 2018-08-23 NOTE — Telephone Encounter (Signed)
Requested medication (s) are due for refill today:  Yes  Requested medication (s) are on the active medication list:   Yes  Future visit scheduled:   No   TOC was to Dr. Mitchel Honour.   Last ordered: 08/20/17  #90  4 refills  Returned because needs OV scheduled.   Requested Prescriptions  Pending Prescriptions Disp Refills   lisinopril-hydrochlorothiazide (ZESTORETIC) 20-25 MG tablet [Pharmacy Med Name: LISINOPRIL-HCTZ 20/25MG  TABLETS] 90 tablet 4    Sig: TAKE 1 TABLET BY MOUTH DAILY     Cardiovascular:  ACEI + Diuretic Combos Failed - 08/23/2018 10:32 AM      Failed - Na in normal range and within 180 days    Sodium  Date Value Ref Range Status  08/20/2017 140 134 - 144 mmol/L Final         Failed - K in normal range and within 180 days    Potassium  Date Value Ref Range Status  08/20/2017 3.6 3.5 - 5.2 mmol/L Final         Failed - Cr in normal range and within 180 days    Creat  Date Value Ref Range Status  01/11/2016 0.91 0.70 - 1.25 mg/dL Final    Comment:      For patients > or = 67 years of age: The upper reference limit for Creatinine is approximately 13% higher for people identified as African-American.      Creatinine, Ser  Date Value Ref Range Status  08/20/2017 0.86 0.76 - 1.27 mg/dL Final         Failed - Ca in normal range and within 180 days    Calcium  Date Value Ref Range Status  08/20/2017 9.8 8.6 - 10.2 mg/dL Final         Failed - Last BP in normal range    BP Readings from Last 1 Encounters:  04/18/18 (!) 153/76         Passed - Patient is not pregnant      Passed - Valid encounter within last 6 months    Recent Outpatient Visits          4 months ago Spinal stenosis of lumbar region, unspecified whether neurogenic claudication present   [facility] at [place], [place], MD   9 months ago Difficulty sleeping   [facility] at [facility], Gelene Mink, PA-C   1 year ago Medicare annual wellness visit, subsequent   Primary Care at [facility], [place], PA-C   1 year ago Chronic midline low back pain without sciatica   Primary Care at [facility], Gelene Mink, PA-C   2 years ago Hypertension, unspecified type   Primary Care at [facility], [place], [place]

## 2018-08-23 NOTE — Telephone Encounter (Signed)
Copied from [place] [phone]. Topic: Quick Communication - Rx Refill/Question >> Aug 23, 2018 10:49 AM Keene Breath wrote: Medication: lisinopril-hydrochlorothiazide (PRINZIDE,ZESTORETIC) 20-25 MG tablet  Patient called to request a refill for the above medication.  Patient stated that he only has 1 pill left  Preferred Pharmacy (with phone number or street name): [facility] [place], [place] AT [facility] [address] [phone] (Phone) [phone] (Fax)

## 2018-08-29 NOTE — Telephone Encounter (Signed)
Pt called to inquire about his recent refill for lisinopril. He normally receives 90 days and was given 30. He would like to know why he was only given a 30 day supply and what he needs to do to get the other 60. 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 Would like a callback. CB#[phone] Okay to contact his wife if you can't reach him at [phone]

## 2018-08-30 ENCOUNTER — Telehealth: Payer: Self-pay | Admitting: Emergency Medicine

## 2018-08-30 NOTE — Telephone Encounter (Signed)
Called and LVM for pt to call office back to schedule an appointment with new PCP to manage his medications. Dur to him not being seen in the office for more then 4 months he needs a follow-up. More medications will be sent to pharmacy once he schedule an appointment.

## 2018-08-30 NOTE — Telephone Encounter (Signed)
Copied from [place] [id number]. Topic: General - Call Back - No Documentation >> Aug 29, 2018  5:20 PM Mcneil, Ja-Kwan wrote: Reason for CRM: Pt stated he was returning the call to [place]. Pt requests call back

## 2018-09-05 ENCOUNTER — Other Ambulatory Visit: Payer: Self-pay

## 2018-09-05 ENCOUNTER — Encounter: Payer: Self-pay | Admitting: Emergency Medicine

## 2018-09-05 ENCOUNTER — Telehealth: Payer: Self-pay | Admitting: *Deleted

## 2018-09-05 ENCOUNTER — Telehealth: Payer: Self-pay | Admitting: Emergency Medicine

## 2018-09-05 ENCOUNTER — Telehealth (INDEPENDENT_AMBULATORY_CARE_PROVIDER_SITE_OTHER): Payer: Medicare Other | Admitting: Emergency Medicine

## 2018-09-05 VITALS — BP 120/74 | Ht 72.0 in | Wt 215.0 lb

## 2018-09-05 DIAGNOSIS — M5431 Sciatica, right side: Secondary | ICD-10-CM

## 2018-09-05 DIAGNOSIS — I10 Essential (primary) hypertension: Secondary | ICD-10-CM

## 2018-09-05 MED ORDER — DICLOFENAC SODIUM 75 MG PO TBEC: 75.0000 mg | DELAYED_RELEASE_TABLET | Freq: Two times a day (BID) | ORAL | 0 refills | Status: AC

## 2018-09-05 MED ORDER — LISINOPRIL-HYDROCHLOROTHIAZIDE 20-25 MG PO TABS: 1.0000 | ORAL_TABLET | Freq: Every day | ORAL | 3 refills | Status: DC

## 2018-09-05 MED ORDER — AMLODIPINE BESYLATE 10 MG PO TABS
10.0000 mg | ORAL_TABLET | Freq: Every day | ORAL | 3 refills | Status: DC
Start: 2018-09-05 — End: 2019-07-24

## 2018-09-05 NOTE — Progress Notes (Signed)
Telemedicine Encounter- SOAP NOTE Established Patient  This telephone encounter was conducted with the patient's (or proxy's) verbal consent via audio telecommunications: yes/no: Yes Patient was instructed to have this encounter in a suitably private space; and to only have persons present to whom they give permission to participate. In addition, patient identity was confirmed by use of name plus two identifiers (DOB and address).  I discussed the limitations, risks, security and privacy concerns of performing an evaluation and management service by telephone and the availability of in person appointments. I also discussed with the patient that there may be a patient responsible charge related to this service. The patient expressed understanding and agreed to proceed.  I spent a total of TIME; 0 MIN TO 60 MIN: 10 minutes talking with the patient or their proxy.  No chief complaint on file. Hypertension follow-up on right-sided sciatica  Subjective   Jason Valencia is a 68 y.o. male established patient. Telephone visit today for hypertension follow-up, medication refills.  Blood pressure at home this morning was 120/74.  Compliant with medications. Bending over last Friday injure right lumbar area and has been having sciatica symptoms since.  Complaining of a sharp constant pain radiating down the back of his right leg all the way down to the ankle area and not associated with any other significant symptoms.  Movement makes it worse, resting makes it better.  Has a prior history. No other complaints or medical concerns today.  HPI   Patient Active Problem List   Diagnosis Date Noted  . Difficulty sleeping 11/17/2017  . SUI (stress urinary incontinence), male 12/14/2015  . 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 History of prostate cancer 02/13/2013  . HTN (hypertension) 10/08/2011  . Vitamin D deficiency 10/08/2011  . 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 Cancer ([place])   . Hypertension     Current Outpatient Medications  Medication Sig Dispense Refill  . amLODipine (NORVASC) 10 MG tablet Take 1 tablet (10 mg total) by mouth daily. 90 tablet 3  . cyclobenzaprine (FLEXERIL) 5 MG tablet TAKE 1 TABLET BY MOUTH EVERY 8 HOURS AS NEEDED. START WITH 1 TABLET AT BEDTIME AS NEEDED DUE TO SEDATION 30 tablet 0  . HYDROcodone-acetaminophen (NORCO/VICODIN) 5-325 MG tablet Take 1 tablet by mouth every 6 (six) hours as needed for moderate pain. 20 tablet 0  . hydrOXYzine (ATARAX/VISTARIL) 50 MG tablet TAKE 1 TO 2 TABLETS BY MOUTH EVERY NIGHT 30 MINUTES BEFORE BEDTIME AS NEEDED 60 tablet 0  . ibuprofen (ADVIL,MOTRIN) 800 MG tablet Take 1 tablet (800 mg total) by mouth every 8 (eight) hours as needed. 90 tablet 0  . lisinopril-hydrochlorothiazide (ZESTORETIC) 20-25 MG tablet Take 1 tablet by mouth daily. 90 tablet 3  . methocarbamol (ROBAXIN-750) 750 MG tablet Take 1 tablet (750 mg total) by mouth 4 (four) times daily. (Patient taking differently: Take 750 mg by mouth as needed. ) 40 tablet 3  . sildenafil (REVATIO) 20 MG tablet Take 1 tablet (20 mg total) by mouth 3 (three) times daily. 60 tablet 6  . cyclobenzaprine (FLEXERIL) 10 MG tablet 1 pill by mouth up to every 8 hours as needed. Start with one pill by mouth each bedtime as needed due to sedation 90 tablet 1  . diclofenac (VOLTAREN) 75 MG EC tablet Take 1 tablet (75 mg total) by mouth 2 (two) times daily for 5 days. After 5 days take as needed. [place]  tablet 0  . ibuprofen (ADVIL,MOTRIN) 800 MG tablet TAKE 1 TABLET(800 MG) BY MOUTH EVERY 8 HOURS AS NEEDED 90 tablet 0   No current facility-administered medications for this visit.     No Known Allergies  Social History   Socioeconomic History  . Marital status: Married    Spouse name: Not on file  . Number of children: 2  . Years of education: Not on file  . Highest education level: Not on file  Occupational History  . Not  on file  Social Needs  . Financial resource strain: Not on file  . Food insecurity:    Worry: Not on file    Inability: Not on file  . Transportation needs:    Medical: Not on file    Non-medical: Not on file  Tobacco Use  . Smoking status: Never Smoker  . Smokeless tobacco: Never Used  Substance and Sexual Activity  . Alcohol use: Yes    Alcohol/week: 2.0 standard drinks    Types: 2 Standard drinks or equivalent per week  . Drug use: No  . Sexual activity: Yes  Lifestyle  . Physical activity:    Days per week: Not on file    Minutes per session: Not on file  . Stress: Not on file  Relationships  . Social connections:    Talks on phone: Not on file    Gets together: Not on file    Attends religious service: Not on file    Active member of club or organization: Not on file    Attends meetings of clubs or organizations: Not on file    Relationship status: Not on file  . Intimate partner violence:    Fear of current or ex partner: Not on file    Emotionally abused: Not on file    Physically abused: Not on file    Forced sexual activity: Not on file  Other Topics Concern  . Not on file  Social History Narrative  . Not on file    Review of Systems  Constitutional: Negative.  Negative for chills and fever.  HENT: Negative.  Negative for congestion and sore throat.   Respiratory: Negative.  Negative for cough and shortness of breath.   Cardiovascular: Negative for chest pain and palpitations.  Gastrointestinal: Negative for abdominal pain, diarrhea, nausea and vomiting.  Genitourinary: Negative.  Negative for dysuria and hematuria.  Musculoskeletal: Positive for back pain.  Skin: Negative.  Negative for rash.  Neurological: Negative.  Negative for dizziness, sensory change and weakness.  Endo/Heme/Allergies: Negative.   All other systems reviewed and are negative.   Objective   Vitals as reported by the patient: Today's Vitals   09/05/18 1353  BP: 120/74   Weight: 215 lb (97.5 kg)  Height: 6' (1.829 m)   Awake and oriented x3 in no apparent respiratory distress. Diagnoses and all orders for this visit:  Hypertension, unspecified type -     amLODipine (NORVASC) 10 MG tablet; Take 1 tablet (10 mg total) by mouth daily. -     lisinopril-hydrochlorothiazide (ZESTORETIC) 20-25 MG tablet; Take 1 tablet by mouth daily.  Sciatica of right side -     diclofenac (VOLTAREN) 75 MG EC tablet; Take 1 tablet (75 mg total) by mouth 2 (two) times daily for 5 days. After 5 days take as needed.  Well-controlled blood pressure.  Continue present medications.  No changes.  Follow-up in 6 months.   I discussed the assessment and treatment plan with the patient. The  patient was provided an opportunity to ask questions and all were answered. The patient agreed with the plan and demonstrated an understanding of the instructions.   The patient was advised to call back or seek an in-person evaluation if the symptoms worsen or if the condition fails to improve as anticipated.  I provided 10 minutes of non-face-to-face time during this encounter.  Horald Pollen, MD  Primary Care at [facility]

## 2018-09-05 NOTE — Progress Notes (Signed)
Called patient to triage for appointment. Patient states he needs medication refill on Amlodipine and Lisinopril/Hctz. Patient states his sciatica nerve is causing pain again and if he can gets an injection to help. Patient states he will call to schedule for a colonoscopy.

## 2018-09-05 NOTE — Telephone Encounter (Signed)
Called patient to triage for appointment. Left message in voice mail to call back.

## 2018-09-06 ENCOUNTER — Telehealth: Payer: Self-pay | Admitting: Physician Assistant

## 2018-09-06 NOTE — Telephone Encounter (Signed)
Copied from [place] [id number]. Topic: Quick Communication - Rx Refill/Question >> Sep 06, 2018  8:37 AM Alanda Slim E wrote: Medication: diclofenac (VOLTAREN) 75 MG EC tablet - Pt read about this medication and feels that It has too many side effects/ Pt would like to know if there is another medication that he can be prescribed/ please advise

## 2018-09-07 ENCOUNTER — Other Ambulatory Visit: Payer: Self-pay | Admitting: Emergency Medicine

## 2018-09-07 DIAGNOSIS — M5431 Sciatica, right side: Secondary | ICD-10-CM

## 2018-09-07 NOTE — Telephone Encounter (Signed)
Is there a different medication he can try? Please advise

## 2018-09-07 NOTE — Telephone Encounter (Signed)
He can take ibuprofen with or without Tylenol.  Thanks.

## 2018-09-08 ENCOUNTER — Other Ambulatory Visit: Payer: Self-pay | Admitting: Emergency Medicine

## 2018-09-08 MED ORDER — MELOXICAM 15 MG PO TABS: 15.0000 mg | ORAL_TABLET | Freq: Every day | ORAL | 0 refills | Status: DC

## 2018-09-08 NOTE — Telephone Encounter (Signed)
Please advise on pt message below.

## 2018-09-08 NOTE — Telephone Encounter (Signed)
Patient called again regarding a medication change.  He stated that the ibuprofen does not help and would like a script for something stronger.  Please advise.

## 2018-09-08 NOTE — Telephone Encounter (Signed)
Prescription sent for meloxicam.  Thanks.

## 2018-09-09 NOTE — Telephone Encounter (Signed)
Patient called to ask about getting an Rx sent to the pharmacy for HYDROcodone-acetaminophen (NORCO/VICODIN) 5-325 MG tablet he states that this is the only thing that helps with his pain. Also asking for someone to call him back and let him know what is going on. Ph# [phone]

## 2018-09-09 NOTE — Telephone Encounter (Signed)
Patient states that he has taken the meloxicam in the past and it does not work for his back. In the past the patient has taken HYDROcodone-acetaminophen (NORCO/VICODIN) 5-325 MG tablet [[id number]]  Patient is requesting HYDROcodone-acetaminophen (NORCO/VICODIN) 5-325 MG tablet [[id number]]  To be sent to the pharmacy for him. Please advise  CB- [phone]

## 2018-09-12 NOTE — Telephone Encounter (Signed)
Patient is calling back to check the status of medications.  Patient claims he is in pain and needs something.   [id number] [address], [address] AT [facility] OF [address] [phone] (Phone) [phone] (Fax)    Patient call back [phone]

## 2018-09-12 NOTE — Telephone Encounter (Signed)
Pt had ov with you 6/9. This pt is asking for meds for pain for sciatica. I have a referral pending for pain management. What will you like of me to tell the pt?

## 2018-09-13 ENCOUNTER — Telehealth: Payer: Self-pay | Admitting: Emergency Medicine

## 2018-09-13 NOTE — Telephone Encounter (Signed)
Agree with your explanation.  Needs OV if not getting better.  Hydrocodone will not be prescribed over the phone.  Thanks.

## 2018-09-13 NOTE — Telephone Encounter (Signed)
Copied from [place] [id number]. Topic: General - Other >> Sep 13, 2018  1:21 PM Selinda Flavin B, NT wrote: Reason for CRM: Patient calling to request a call back from Dr Mitchel Honour. States that it is regarding his back. Would not give any further details. Please advise.  CB#: [phone]

## 2018-09-13 NOTE — Telephone Encounter (Signed)
Being handled by office manager

## 2018-09-13 NOTE — Telephone Encounter (Signed)
Pt called in stating he wants hydrocodone for sciatica.  Advised that per recent telemed OV note, pt would need to be seen in clinic if there was no improvement or worsening of symptoms.  Pt presents very upset, stating he was given this medication before by Juanda Crumble and was seen in January.  Pt states he should not need to be seen in clinic and he was never told he would need to come in.  Apologized and explained that if controlled meds are needed, we will need to physically assess cause of pain.  Reiterated that pt would need in-office appt.  Pt continued to be quite upset.  States that if we won't give him the medicine "just forget it", he will find another provider.

## 2018-09-13 NOTE — Telephone Encounter (Signed)
Call to pt.  Explained again that MD note states pt needs appt in clinic if back does not improve.  Pt again very upset that medication cannot be called in.  States he will not be coming into clinic and that he should be able to have a conversation on the phone with his doctor.  Advised that we are following recommendations made in prev OV note.  Pt states we should not have returned his message if he is not allowed to speak to his doctor.  Advised pt that another reason we called back is that his MD is out of the clinic on vacation for the entire week and I did not want him waiting that long for a response.  Recommended an appt with another provider this week for request for pain meds.  Pt refused to make appt.

## 2018-09-16 NOTE — Telephone Encounter (Signed)
Attempted to call pt and left on a message on voice mail stating for medication refill pt would need to be seen in office.

## 2018-10-04 ENCOUNTER — Ambulatory Visit (INDEPENDENT_AMBULATORY_CARE_PROVIDER_SITE_OTHER): Payer: Medicare Other | Admitting: Emergency Medicine

## 2018-10-04 VITALS — BP 122/80 | Ht 72.0 in | Wt 215.0 lb

## 2018-10-04 DIAGNOSIS — Z0000 Encounter for general adult medical examination without abnormal findings: Secondary | ICD-10-CM | POA: Diagnosis not present

## 2018-10-04 NOTE — Patient Instructions (Signed)
Thank you for taking time to come for your Medicare Wellness Visit. I appreciate your ongoing commitment to your health goals. Please review the following plan we discussed and let me know if I can assist you in the future.  Leroy Kennedy LPN  Preventive Care 67 Years and Older, Male Preventive care refers to lifestyle choices and visits with your health care provider that can promote health and wellness. This includes:  A yearly physical exam. This is also called an annual well check.  Regular dental and eye exams.  Immunizations.  Screening for certain conditions.  Healthy lifestyle choices, such as diet and exercise. What can I expect for my preventive care visit? Physical exam Your health care provider will check:  Height and weight. These may be used to calculate body mass index (BMI), which is a measurement that tells if you are at a healthy weight.  Heart rate and blood pressure.  Your skin for abnormal spots. Counseling Your health care provider may ask you questions about:  Alcohol, tobacco, and drug use.  Emotional well-being.  Home and relationship well-being.  Sexual activity.  Eating habits.  History of falls.  Memory and ability to understand (cognition).  Work and work Statistician. What immunizations do I need?  Influenza (flu) vaccine  This is recommended every year. Tetanus, diphtheria, and pertussis (Tdap) vaccine  You may need a Td booster every 10 years. Varicella (chickenpox) vaccine  You may need this vaccine if you have not already been vaccinated. Zoster (shingles) vaccine  You may need this after age 66. Pneumococcal conjugate (PCV13) vaccine  One dose is recommended after age 84. Pneumococcal polysaccharide (PPSV23) vaccine  One dose is recommended after age 88. Measles, mumps, and rubella (MMR) vaccine  You may need at least one dose of MMR if you were born in 1957 or later. You may also need a second dose. Meningococcal  conjugate (MenACWY) vaccine  You may need this if you have certain conditions. Hepatitis A vaccine  You may need this if you have certain conditions or if you travel or work in places where you may be exposed to hepatitis A. Hepatitis B vaccine  You may need this if you have certain conditions or if you travel or work in places where you may be exposed to hepatitis B. Haemophilus influenzae type b (Hib) vaccine  You may need this if you have certain conditions. You may receive vaccines as individual doses or as more than one vaccine together in one shot (combination vaccines). Talk with your health care provider about the risks and benefits of combination vaccines. What tests do I need? Blood tests  Lipid and cholesterol levels. These may be checked every 5 years, or more frequently depending on your overall health.  Hepatitis C test.  Hepatitis B test. Screening  Lung cancer screening. You may have this screening every year starting at age 24 if you have a 30-pack-year history of smoking and currently smoke or have quit within the past 15 years.  Colorectal cancer screening. All adults should have this screening starting at age 52 and continuing until age 61. Your health care provider may recommend screening at age 57 if you are at increased risk. You will have tests every 1-10 years, depending on your results and the type of screening test.  Prostate cancer screening. Recommendations will vary depending on your family history and other risks.  Diabetes screening. This is done by checking your blood sugar (glucose) after you have not eaten for  a while (fasting). You may have this done every 1-3 years.  Abdominal aortic aneurysm (AAA) screening. You may need this if you are a current or former smoker.  Sexually transmitted disease (STD) testing. Follow these instructions at home: Eating and drinking  Eat a diet that includes fresh fruits and vegetables, whole grains, lean  protein, and low-fat dairy products. Limit your intake of foods with high amounts of sugar, saturated fats, and salt.  Take vitamin and mineral supplements as recommended by your health care provider.  Do not drink alcohol if your health care provider tells you not to drink.  If you drink alcohol: ? Limit how much you have to 0-2 drinks a day. ? Be aware of how much alcohol is in your drink. In the [place] one drink equals one 12 oz bottle of beer (355 mL), one 5 oz glass of wine (148 mL), or one 1 oz glass of hard liquor (44 mL). Lifestyle  Take daily care of your teeth and gums.  Stay active. Exercise for at least 30 minutes on 5 or more days each week.  Do not use any products that contain nicotine or tobacco, such as cigarettes, e-cigarettes, and chewing tobacco. If you need help quitting, ask your health care provider.  If you are sexually active, practice safe sex. Use a condom or other form of protection to prevent STIs (sexually transmitted infections).  Talk with your health care provider about taking a low-dose aspirin or statin. What's next?  Visit your health care provider once a year for a well check visit.  Ask your health care provider how often you should have your eyes and teeth checked.  Stay up to date on all vaccines. This information is not intended to replace advice given to you by your health care provider. Make sure you discuss any questions you have with your health care provider. Document Released: 04/12/2015 Document Revised: 03/10/2018 Document Reviewed: 03/10/2018 Elsevier Patient Education  2020 [employer].

## 2018-10-04 NOTE — Progress Notes (Signed)
Presents today for [employer] Visit   Date of last exam: 09/05/2018  Interpreter used for this visit? No  I connected with  Jason Valencia on 10/04/18 by a video enabled telemedicine application and verified that I am speaking with the correct person using two identifiers.     Patient Care Team: McVey, Gelene Mink, PA-C as PCP - General (Physician Assistant) Lorenza Evangelist, MD as Consulting Physician (Urology)   Other items to address today:   Discussed Eye/Dental Discussed immunizations    Other Screening: Last screening for diabetes:   Last lipid screening: 08/21/2018  ADVANCE DIRECTIVES: Discussed: yes On File: no Materials Provided:  Yes (mailed)  Immunization status:  Immunization History  Administered Date(s) Administered  . Influenza Split 02/25/2017  . Influenza,inj,Quad PF,6+ Mos 02/13/2013, 12/11/2014, 02/19/2016  . Td 02/13/2013     There are no preventive care reminders to display for this patient.   Functional Status Survey: Is the patient deaf or have difficulty hearing?: No Does the patient have difficulty seeing, even when wearing glasses/contacts?: No Does the patient have difficulty concentrating, remembering, or making decisions?: No Does the patient have difficulty walking or climbing stairs?: No Does the patient have difficulty dressing or bathing?: No Does the patient have difficulty doing errands alone such as visiting a doctor's office or shopping?: No   6CIT Screen 10/04/2018  What Year? 0 points  What month? 0 points  What time? 0 points  Count back from 20 0 points  Months in reverse 0 points  Repeat phrase 0 points  Total Score 0        Clinical Support from 10/04/2018 in [facility]  AUDIT-C Score  0       Home Environment:   No trouble climbing stairs  Lives in one story home  No scattered rugs No clutter Adequate lighting   Patient Active Problem List   Diagnosis  Date Noted  . Difficulty sleeping 11/17/2017  . SUI (stress urinary incontinence), male 12/14/2015  . 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 History of prostate cancer 02/13/2013  . HTN (hypertension) 10/08/2011  . Vitamin D deficiency 10/08/2011  . Chronic pain due to trauma 10/08/2011     Past Medical History:  Diagnosis Date  . Cancer ([place])   . Hypertension      Past Surgical History:  Procedure Laterality Date  . PROSTATE SURGERY       Family History  Problem Relation Age of Onset  . Cancer Mother        ovarian  . Heart disease Father   . Cancer Sister        throat      Social History   Socioeconomic History  . Marital status: Married    Spouse name: Not on file  . Number of children: 2  . Years of education: Not on file  . Highest education level: Not on file  Occupational History  . Not on file  Social Needs  . Financial resource strain: Not on file  . Food insecurity    Worry: Not on file    Inability: Not on file  . Transportation needs    Medical: Not on file    Non-medical: Not on file  Tobacco Use  . Smoking status: Never Smoker  . Smokeless tobacco: Never Used  Substance and Sexual Activity  . Alcohol use: Yes    Alcohol/week: 2.0 standard drinks    Types: 2 Standard drinks  or equivalent per week  . Drug use: No  . Sexual activity: Yes  Lifestyle  . Physical activity    Days per week: Not on file    Minutes per session: Not on file  . Stress: Not on file  Relationships  . Social Herbalist on phone: Not on file    Gets together: Not on file    Attends religious service: Not on file    Active member of club or organization: Not on file    Attends meetings of clubs or organizations: Not on file    Relationship status: Not on file  . Intimate partner violence    Fear of current or ex partner: Not on file    Emotionally abused: Not on file    Physically abused: Not on file    Forced sexual activity: Not on file   Other Topics Concern  . Not on file  Social History Narrative  . Not on file     No Known Allergies   Prior to Admission medications   Medication Sig Start Date End Date Taking? Authorizing Provider  amLODipine (NORVASC) 10 MG tablet Take 1 tablet (10 mg total) by mouth daily. 09/05/18 12/04/18 Yes Sagardia, Ines Bloomer, MD  hydrOXYzine (ATARAX/VISTARIL) 50 MG tablet TAKE 1 TO 2 TABLETS BY MOUTH EVERY NIGHT 30 MINUTES BEFORE BEDTIME AS NEEDED 06/14/18  Yes McVey, Gelene Mink, PA-C  ibuprofen (ADVIL,MOTRIN) 800 MG tablet Take 1 tablet (800 mg total) by mouth every 8 (eight) hours as needed. 12/14/16  Yes McVey, Gelene Mink, PA-C  lisinopril-hydrochlorothiazide (ZESTORETIC) 20-25 MG tablet Take 1 tablet by mouth daily. 09/05/18 12/04/18 Yes Sagardia, Ines Bloomer, MD  meloxicam (MOBIC) 15 MG tablet Take 1 tablet (15 mg total) by mouth daily. 09/08/18  Yes Sagardia, Ines Bloomer, MD  methocarbamol (ROBAXIN-750) 750 MG tablet Take 1 tablet (750 mg total) by mouth 4 (four) times daily. Patient taking differently: Take 750 mg by mouth as needed.  11/18/16  Yes McVey, Gelene Mink, PA-C  sildenafil (REVATIO) 20 MG tablet Take 1 tablet (20 mg total) by mouth 3 (three) times daily. 12/11/14  Yes Guest, Benn Moulder, MD  cyclobenzaprine (FLEXERIL) 10 MG tablet 1 pill by mouth up to every 8 hours as needed. Start with one pill by mouth each bedtime as needed due to sedation Patient not taking: Reported on 10/04/2018 11/17/17   McVey, Gelene Mink, PA-C  cyclobenzaprine (FLEXERIL) 5 MG tablet TAKE 1 TABLET BY MOUTH EVERY 8 HOURS AS NEEDED. START WITH 1 TABLET AT BEDTIME AS NEEDED DUE TO SEDATION Patient not taking: Reported on 10/04/2018 11/15/17   McVey, Gelene Mink, PA-C  HYDROcodone-acetaminophen (NORCO/VICODIN) 5-325 MG tablet Take 1 tablet by mouth every 6 (six) hours as needed for moderate pain. Patient not taking: Reported on 10/04/2018 09/14/17   McVey, Gelene Mink, PA-C  ibuprofen  (ADVIL,MOTRIN) 800 MG tablet TAKE 1 TABLET(800 MG) BY MOUTH EVERY 8 HOURS AS NEEDED 06/13/18   Dorise Hiss, [place]     Depression screen [facility] 2/9 10/04/2018 09/05/2018 04/18/2018 11/17/2017 08/20/2017  Decreased Interest 0 0 0 0 0  Down, Depressed, Hopeless 0 0 0 0 0  PHQ - 2 Score 0 0 0 0 0     Fall Risk  10/04/2018 09/05/2018 04/18/2018 11/17/2017 08/20/2017  Falls in the past year? 0 0 0 No No  Number falls in past yr: 0 - - - -  Injury with Fall? 0 - - - -  Follow up Falls  evaluation completed;Education provided;Falls prevention discussed Falls evaluation completed - - -      PHYSICAL EXAM: BP 122/80 Comment: patient took at home  Ht 6' (1.829 m)   Wt 215 lb (97.5 kg)   BMI 29.16 kg/m    Wt Readings from Last 3 Encounters:  10/04/18 215 lb (97.5 kg)  09/05/18 215 lb (97.5 kg)  04/18/18 225 lb 6.4 oz (102.2 kg)     No exam data present    Physical Exam   Education/Counseling provided regarding diet and exercise, prevention of chronic diseases, smoking/tobacco cessation, if applicable, and reviewed "Covered Medicare Preventive Services."

## 2018-11-08 ENCOUNTER — Other Ambulatory Visit: Payer: Self-pay

## 2018-11-08 ENCOUNTER — Ambulatory Visit (AMBULATORY_SURGERY_CENTER): Payer: Self-pay | Admitting: *Deleted

## 2018-11-08 VITALS — Temp 97.1°F | Ht 72.0 in | Wt 218.6 lb

## 2018-11-08 DIAGNOSIS — Z1211 Encounter for screening for malignant neoplasm of colon: Secondary | ICD-10-CM

## 2018-11-08 MED ORDER — SUPREP BOWEL PREP KIT 17.5-3.13-1.6 GM/177ML PO SOLN: ORAL | 0 refills | Status: DC

## 2018-11-08 NOTE — Progress Notes (Signed)
Previsit is done in person  Pt is aware that care partner will wait in the car during procedure; if they feel like they will be too hot to wait in the car; they may wait in the lobby.  We want them to wear a mask (we do not have any that we can provide them), practice social distancing, and we will check their temperatures when they get here.  I did remind patient that their care partner needs to stay in the parking lot the entire time. Pt will wear mask into building.  No egg or soy allergy  No home oxygen use or problems with anesthesia  No medications for weight loss taken  emmi information given

## 2018-11-16 ENCOUNTER — Telehealth: Payer: Self-pay | Admitting: Gastroenterology

## 2018-11-16 DIAGNOSIS — Z1211 Encounter for screening for malignant neoplasm of colon: Secondary | ICD-10-CM

## 2018-11-16 MED ORDER — PEG 3350-KCL-NA BICARB-NACL 420 G PO SOLR: 4000.0000 mL | Freq: Once | ORAL | 0 refills | Status: AC

## 2018-11-16 NOTE — Telephone Encounter (Signed)
Patient states Suprep is $109. We have no samples at this time. Golytely sent to pt's pharmacy and new instructions mailed to pt. Pt is aware! He will call us back with any questions.

## 2018-11-18 ENCOUNTER — Encounter: Payer: Self-pay | Admitting: Gastroenterology

## 2018-11-22 ENCOUNTER — Telehealth: Payer: Self-pay | Admitting: Gastroenterology

## 2018-11-22 NOTE — Telephone Encounter (Signed)

## 2018-11-23 ENCOUNTER — Other Ambulatory Visit: Payer: Self-pay

## 2018-11-23 ENCOUNTER — Encounter: Payer: Self-pay | Admitting: Gastroenterology

## 2018-11-23 ENCOUNTER — Ambulatory Visit (AMBULATORY_SURGERY_CENTER): Payer: Medicare Other | Admitting: Gastroenterology

## 2018-11-23 VITALS — BP 107/74 | HR 60 | Temp 98.2°F | Resp 16 | Ht 72.0 in | Wt 218.0 lb

## 2018-11-23 DIAGNOSIS — D123 Benign neoplasm of transverse colon: Secondary | ICD-10-CM

## 2018-11-23 DIAGNOSIS — D125 Benign neoplasm of sigmoid colon: Secondary | ICD-10-CM

## 2018-11-23 DIAGNOSIS — Z1211 Encounter for screening for malignant neoplasm of colon: Secondary | ICD-10-CM | POA: Diagnosis not present

## 2018-11-23 DIAGNOSIS — D122 Benign neoplasm of ascending colon: Secondary | ICD-10-CM

## 2018-11-23 DIAGNOSIS — K640 First degree hemorrhoids: Secondary | ICD-10-CM

## 2018-11-23 MED ORDER — SODIUM CHLORIDE 0.9 % IV SOLN: 500.0000 mL | Freq: Once | INTRAVENOUS | Status: DC

## 2018-11-23 NOTE — Progress Notes (Signed)
Pt's states no medical or surgical changes since previsit or office visit. 

## 2018-11-23 NOTE — Op Note (Signed)
Jason Valencia Patient Name: Blaze Woloszyk Procedure Date: 11/23/2018 7:35 AM MRN: [id number] Endoscopist: Gerrit Heck , MD Age: 68 Referring MD:  Date of Birth: [birth year] Gender: Male Account #: [id number] Procedure:                Colonoscopy Indications:              Screening for colorectal malignant neoplasm (last                            colonoscopy was 10 years ago) Medicines:                Monitored Anesthesia Care Procedure:                Pre-Anesthesia Assessment:                           - Prior to the procedure, a History and Physical                            was performed, and patient medications and                            allergies were reviewed. The patient's tolerance of                            previous anesthesia was also reviewed. The risks                            and benefits of the procedure and the sedation                            options and risks were discussed with the patient.                            All questions were answered, and informed consent                            was obtained. Prior Anticoagulants: The patient has                            taken no previous anticoagulant or antiplatelet                            agents. ASA Grade Assessment: II - A patient with                            mild systemic disease. After reviewing the risks                            and benefits, the patient was deemed in                            satisfactory condition to undergo the procedure.  After obtaining informed consent, the colonoscope                            was passed under direct vision. Throughout the                            procedure, the patient's blood pressure, pulse, and                            oxygen saturations were monitored continuously. The                            Colonoscope was introduced through the anus and                            advanced to the the terminal  ileum. The colonoscopy                            was performed without difficulty. The patient                            tolerated the procedure well. The quality of the                            bowel preparation was adequate. The terminal ileum,                            ileocecal valve, appendiceal orifice, and rectum                            were photographed. Scope In: 8:07:16 AM Scope Out: 8:26:26 AM Scope Withdrawal Time: 0 hours 14 minutes 1 second  Total Procedure Duration: 0 hours 19 minutes 10 seconds  Findings:                 The perianal and digital rectal examinations were                            normal.                           Three sessile polyps were found in the rectosigmoid                            colon, transverse colon and ascending colon. The                            polyps were 2 to 4 mm in size. These polyps were                            removed with a cold snare. Resection and retrieval                            were complete. Estimated blood loss was minimal.  Non-bleeding internal hemorrhoids were found during                            retroflexion. The hemorrhoids were small.                           The exam was otherwise normal throughout the                            remainder of the colon.                           The terminal ileum appeared normal. Complications:            No immediate complications. Estimated Blood Loss:     Estimated blood loss was minimal. Impression:               - Three 2 to 4 mm polyps in the rectosigmoid colon,                            in the transverse colon and in the ascending colon,                            removed with a cold snare. Resected and retrieved.                           - Non-bleeding internal hemorrhoids.                           - The examined portion of the ileum was normal. Recommendation:           - Patient has a contact number available for                             emergencies. The signs and symptoms of potential                            delayed complications were discussed with the                            patient. Return to normal activities tomorrow.                            Written discharge instructions were provided to the                            patient.                           - Resume previous diet today.                           - Continue present medications.                           - Await pathology results.                           -  Repeat colonoscopy for surveillance based on                            pathology results.                           - Return to GI clinic PRN.                           - Use fiber, for example Citrucel, Fibercon, Konsyl                            or Metamucil.                           - Internal hemorrhoids were noted on this study and                            may be amenable to hemorrhoid band ligation. If you                            are interested in further treatment of these                            hemorrhoids with band ligation, please contact my                            clinic to set up an appointment for evaluation and                            treatment. Gerrit Heck, MD 11/23/2018 8:32:48 AM

## 2018-11-23 NOTE — Progress Notes (Signed)
To PACU, VSS. Report to [email or web address] 

## 2018-11-23 NOTE — Patient Instructions (Signed)
Thank you for allowing [place] to care for you today !  Await pathology results by mail, approximately 2 weeks.  Recommendation for next colonoscopy will be made at that time.  Use fiber, for example Citrucel, Fibercon, Konsyl, or Metamucil.  If hemorrhoids are bothersome, there is an in-office procedure available if interested.    Resume previous diet and medications today.  Return to your normal activities tomorrow.       YOU HAD AN ENDOSCOPIC PROCEDURE TODAY AT [facility]:   Refer to the procedure report that was given to you for any specific questions about what was found during the examination.  If the procedure report does not answer your questions, please call your gastroenterologist to clarify.  If you requested that your care partner not be given the details of your procedure findings, then the procedure report has been included in a sealed envelope for you to review at your convenience later.  YOU SHOULD EXPECT: Some feelings of bloating in the abdomen. Passage of more gas than usual.  Walking can help get rid of the air that was put into your GI tract during the procedure and reduce the bloating. If you had a lower endoscopy (such as a colonoscopy or flexible sigmoidoscopy) you may notice spotting of blood in your stool or on the toilet paper. If you underwent a bowel prep for your procedure, you may not have a normal bowel movement for a few days.  Please Note:  You might notice some irritation and congestion in your nose or some drainage.  This is from the oxygen used during your procedure.  There is no need for concern and it should clear up in a day or so.  SYMPTOMS TO REPORT IMMEDIATELY:   Following lower endoscopy (colonoscopy or flexible sigmoidoscopy):  Excessive amounts of blood in the stool  Significant tenderness or worsening of abdominal pains  Swelling of the abdomen that is new, acute  Fever of 100F or higher    For urgent or emergent issues, a  gastroenterologist can be reached at any hour by calling [phone].   DIET:  We do recommend a small meal at first, but then you may proceed to your regular diet.  Drink plenty of fluids but you should avoid alcoholic beverages for 24 hours.  ACTIVITY:  You should plan to take it easy for the rest of today and you should NOT DRIVE or use heavy machinery until tomorrow (because of the sedation medicines used during the test).    FOLLOW UP: Our staff will call the number listed on your records 48-72 hours following your procedure to check on you and address any questions or concerns that you may have regarding the information given to you following your procedure. If we do not reach you, we will leave a message.  We will attempt to reach you two times.  During this call, we will ask if you have developed any symptoms of COVID 19. If you develop any symptoms (ie: fever, flu-like symptoms, shortness of breath, cough etc.) before then, please call [phone].  If you test positive for Covid 19 in the 2 weeks post procedure, please call and report this information to [place].    If any biopsies were taken you will be contacted by phone or by letter within the next 1-3 weeks.  Please call us at [phone] if you have not heard about the biopsies in 3 weeks.    SIGNATURES/CONFIDENTIALITY: You and/or your care partner have  signed paperwork which will be entered into your electronic medical record.  These signatures attest to the fact that that the information above on your After Visit Summary has been reviewed and is understood.  Full responsibility of the confidentiality of this discharge information lies with you and/or your care-partner.

## 2018-11-23 NOTE — Progress Notes (Signed)
Called to room to assist during endoscopic procedure.  Patient ID and intended procedure confirmed with present staff. Received instructions for my participation in the procedure from the performing physician.  

## 2018-11-23 NOTE — Progress Notes (Signed)
June-temp [employer]

## 2018-11-25 ENCOUNTER — Telehealth: Payer: Self-pay

## 2018-11-25 NOTE — Telephone Encounter (Signed)
Second post procedure follow up call, no answer 

## 2018-11-25 NOTE — Telephone Encounter (Signed)
First post procedure follow up call, no answer 

## 2018-11-29 ENCOUNTER — Ambulatory Visit: Payer: Medicare Other | Admitting: Family Medicine

## 2018-12-01 ENCOUNTER — Other Ambulatory Visit: Payer: Self-pay | Admitting: Physician Assistant

## 2018-12-01 DIAGNOSIS — M545 Low back pain, unspecified: Secondary | ICD-10-CM

## 2018-12-01 DIAGNOSIS — G8929 Other chronic pain: Secondary | ICD-10-CM

## 2018-12-02 ENCOUNTER — Other Ambulatory Visit: Payer: Self-pay | Admitting: Physician Assistant

## 2018-12-02 NOTE — Telephone Encounter (Signed)
Requested medication (s) are due for refill today: yes  Requested medication (s) are on the active medication list: yes  Last refill:  12/14/2016  Future visit scheduled: no  Notes to clinic:  Review refill  Requested Prescriptions  Pending Prescriptions Disp Refills   ibuprofen (ADVIL) 800 MG tablet 90 tablet 0    Sig: Take 1 tablet (800 mg total) by mouth every 8 (eight) hours as needed.     Analgesics:  NSAIDS Failed - 12/02/2018 12:28 PM      Failed - Cr in normal range and within 360 days    Creat  Date Value Ref Range Status  01/11/2016 0.91 0.70 - 1.25 mg/dL Final    Comment:      For patients > or = 67 years of age: The upper reference limit for Creatinine is approximately 13% higher for people identified as African-American.      Creatinine, Ser  Date Value Ref Range Status  08/20/2017 0.86 0.76 - 1.27 mg/dL Final         Failed - HGB in normal range and within 360 days    Hemoglobin  Date Value Ref Range Status  08/20/2017 14.2 13.0 - 17.7 g/dL Final         Passed - Patient is not pregnant      Passed - Valid encounter within last 12 months    Recent Outpatient Visits          1 month ago Medicare annual wellness visit, subsequent   Primary Care at [facility], [place], MD   2 months ago Hypertension, unspecified type   Primary Care at [facility], [place], [place]   7 months ago Spinal stenosis of lumbar region, unspecified whether neurogenic claudication present   Primary Care at [facility], Ines Bloomer, MD   1 year ago Difficulty sleeping   Primary Care at [facility], Gelene Mink, PA-C   1 year ago Medicare annual wellness visit, subsequent   Primary Care at [facility], Gelene Mink, [place]            In 1 week Ethelene Hal, Mortimer Fries, MD [facility] [address] [facility]

## 2018-12-02 NOTE — Telephone Encounter (Signed)
Medication Refill - Medication:  ibuprofen (ADVIL,MOTRIN) 800 MG tablet  Has the patient contacted their pharmacy?  Yes advised to call office.   Preferred Pharmacy (with phone number or street name):  [facility] [id number] - [place], [address] AT [address] [phone] (Phone) [phone] (Fax)   Agent: Please be advised that RX refills may take up to 3 business days. We ask that you follow-up with your pharmacy.

## 2018-12-06 NOTE — Telephone Encounter (Signed)
Requested medication (s) are due for refill today: yes  Requested medication (s) are on the active medication list: yes   Future visit scheduled: no  Notes to clinic:  Review for refill   Requested Prescriptions  Pending Prescriptions Disp Refills   ibuprofen (ADVIL) 800 MG tablet 90 tablet 0    Sig: Take 1 tablet (800 mg total) by mouth every 8 (eight) hours as needed.     Analgesics:  NSAIDS Failed - 12/06/2018  9:50 AM      Failed - Cr in normal range and within 360 days    Creat  Date Value Ref Range Status  01/11/2016 0.91 0.70 - 1.25 mg/dL Final    Comment:      For patients > or = 67 years of age: The upper reference limit for Creatinine is approximately 13% higher for people identified as African-American.      Creatinine, Ser  Date Value Ref Range Status  08/20/2017 0.86 0.76 - 1.27 mg/dL Final         Failed - HGB in normal range and within 360 days    Hemoglobin  Date Value Ref Range Status  08/20/2017 14.2 13.0 - 17.7 g/dL Final         Passed - Patient is not pregnant      Passed - Valid encounter within last 12 months    Recent Outpatient Visits          2 months ago Medicare annual wellness visit, subsequent   Primary Care at [facility], [place], MD   3 months ago Hypertension, unspecified type   Primary Care at [facility], [place], MD   7 months ago Spinal stenosis of lumbar region, unspecified whether neurogenic claudication present   Primary Care at [facility], Ines Bloomer, MD   1 year ago Difficulty sleeping   Primary Care at [facility], Gelene Mink, PA-C   1 year ago Medicare annual wellness visit, subsequent   Primary Care at [facility], Gelene Mink, PA-C      Future Appointments            In 3 days Ethelene Hal, Mortimer Fries, MD [facility] [address], [facility]

## 2018-12-06 NOTE — Telephone Encounter (Signed)
Pt stated he received a call from NT regarding his refill for Ibuprofen and needed to return call. Did not have a name. Requesting CB. Please advise. Okay to leave detailed vm. CB#[phone]

## 2018-12-07 ENCOUNTER — Telehealth: Payer: Self-pay | Admitting: Emergency Medicine

## 2018-12-07 NOTE — Telephone Encounter (Signed)
Patient called in to discuss medication Ibuprofen 800mg   Jason Valencia patient wants a phone call

## 2018-12-09 ENCOUNTER — Ambulatory Visit: Payer: Medicare Other | Admitting: Family Medicine

## 2018-12-15 ENCOUNTER — Other Ambulatory Visit: Payer: Self-pay

## 2018-12-15 ENCOUNTER — Encounter: Payer: Self-pay | Admitting: Gastroenterology

## 2018-12-16 ENCOUNTER — Encounter: Payer: Self-pay | Admitting: Family Medicine

## 2018-12-16 ENCOUNTER — Ambulatory Visit (INDEPENDENT_AMBULATORY_CARE_PROVIDER_SITE_OTHER): Payer: Medicare Other | Admitting: Family Medicine

## 2018-12-16 VITALS — BP 128/90 | HR 87 | Ht 72.0 in | Wt 220.0 lb

## 2018-12-16 DIAGNOSIS — I10 Essential (primary) hypertension: Secondary | ICD-10-CM | POA: Diagnosis not present

## 2018-12-16 DIAGNOSIS — Z0000 Encounter for general adult medical examination without abnormal findings: Secondary | ICD-10-CM | POA: Diagnosis not present

## 2018-12-16 DIAGNOSIS — Z8546 Personal history of malignant neoplasm of prostate: Secondary | ICD-10-CM | POA: Diagnosis not present

## 2018-12-16 DIAGNOSIS — Z13220 Encounter for screening for lipoid disorders: Secondary | ICD-10-CM | POA: Insufficient documentation

## 2018-12-16 DIAGNOSIS — M545 Low back pain, unspecified: Secondary | ICD-10-CM | POA: Insufficient documentation

## 2018-12-16 DIAGNOSIS — G8929 Other chronic pain: Secondary | ICD-10-CM

## 2018-12-16 DIAGNOSIS — Z23 Encounter for immunization: Secondary | ICD-10-CM | POA: Diagnosis not present

## 2018-12-16 MED ORDER — IBUPROFEN 800 MG PO TABS: ORAL_TABLET | ORAL | 0 refills | Status: DC

## 2018-12-16 NOTE — Progress Notes (Signed)
Established Patient Office Visit  Subjective:  Patient ID: Jason Valencia, male    DOB: Aug 07, 1950  Age: 68 y.o. MRN: [id number]  CC:  Chief Complaint  Patient presents with  . Establish Care    HPI Jason Valencia presents for establishment of care by way of transfer from another clinic.  Blood pressure is been well controlled with amlodipine and Zestoretic.  He is tolerating these medications well.  Status post prostatectomy back in 2011.  He has done well since but still dealing with continence issues.  Continues to see urology for these problems.  He is retired after working at [employer] for 39 years.  He has occasional lower back pain and knee pain.  Status post eye check this year.  He is scheduled to see the dentist soon.  Colonoscopy this year.  He is remaining quite active and his retirement.  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  He does not smoke and drinks alcohol on occasion.  Past Medical History:  Diagnosis Date  . Cancer [facility])    prostate cancer- 2011  . Hypertension     Past Surgical History:  Procedure Laterality Date  . COLONOSCOPY    . KNEE SURGERY    . PROSTATE SURGERY    . ROTATOR CUFF REPAIR     left    Family History  Problem Relation Age of Onset  . Cancer Mother        ovarian  . Heart disease Father   . Cancer Sister        throat   . Colon cancer Neg Hx   . Esophageal cancer Neg Hx   . Rectal cancer Neg Hx   . Stomach cancer Neg Hx     Social History   Socioeconomic History  . Marital status: Married    Spouse name: Not on file  . Number of children: 2  . Years of education: Not on file  . Highest education level: Not on file  Occupational History  . Not on file  Social Needs  . Financial resource strain: Not on file  . Food insecurity    Worry: Not on file    Inability: Not on file  . Transportation needs    Medical: Not on file    Non-medical: Not on file  Tobacco Use  . Smoking status: Never  Smoker  . Smokeless tobacco: Never Used  Substance and Sexual Activity  . Alcohol use: Yes    Alcohol/week: 2.0 standard drinks    Types: 2 Standard drinks or equivalent per week  . Drug use: No  . Sexual activity: Yes  Lifestyle  . Physical activity    Days per week: Not on file    Minutes per session: Not on file  . Stress: Not on file  Relationships  . Social Herbalist on phone: Not on file    Gets together: Not on file    Attends religious service: Not on file    Active member of club or organization: Not on file    Attends meetings of clubs or organizations: Not on file    Relationship status: Not on file  . Intimate partner violence    Fear of current or ex partner: Not on file    Emotionally abused: Not on file    Physically abused: Not on file    Forced sexual activity: Not on file  Other Topics  Concern  . Not on file  Social History Narrative  . Not on file    Outpatient Medications Prior to Visit  Medication Sig Dispense Refill  . cyclobenzaprine (FLEXERIL) 5 MG tablet TAKE 1 TABLET BY MOUTH EVERY 8 HOURS AS NEEDED. START WITH 1 TABLET AT BEDTIME AS NEEDED DUE TO SEDATION 30 tablet 0  . HYDROcodone-acetaminophen (NORCO/VICODIN) 5-325 MG tablet Take 1 tablet by mouth every 6 (six) hours as needed for moderate pain. 20 tablet 0  . hydrOXYzine (ATARAX/VISTARIL) 50 MG tablet TAKE 1 TO 2 TABLETS BY MOUTH EVERY NIGHT 30 MINUTES BEFORE BEDTIME AS NEEDED 60 tablet 0  . meloxicam (MOBIC) 15 MG tablet Take 1 tablet (15 mg total) by mouth daily. 15 tablet 0  . methocarbamol (ROBAXIN-750) 750 MG tablet Take 1 tablet (750 mg total) by mouth 4 (four) times daily. (Patient taking differently: Take 750 mg by mouth as needed. ) 40 tablet 3  . sildenafil (REVATIO) 20 MG tablet Take 1 tablet (20 mg total) by mouth 3 (three) times daily. 60 tablet 6  . ibuprofen (ADVIL,MOTRIN) 800 MG tablet TAKE 1 TABLET(800 MG) BY MOUTH EVERY 8 HOURS AS NEEDED 90 tablet 0  . amLODipine  (NORVASC) 10 MG tablet Take 1 tablet (10 mg total) by mouth daily. 90 tablet 3  . lisinopril-hydrochlorothiazide (ZESTORETIC) 20-25 MG tablet Take 1 tablet by mouth daily. 90 tablet 3  . ibuprofen (ADVIL,MOTRIN) 800 MG tablet Take 1 tablet (800 mg total) by mouth every 8 (eight) hours as needed. 90 tablet 0   No facility-administered medications prior to visit.     No Known Allergies  Valencia Review of Systems  Constitutional: Negative.   HENT: Negative.   Eyes: Negative for photophobia and visual disturbance.  Respiratory: Negative.   Cardiovascular: Negative.   Gastrointestinal: Negative.   Endocrine: Negative for polyphagia and polyuria.  Musculoskeletal: Negative for gait problem and myalgias.  Allergic/Immunologic: Negative for immunocompromised state.  Neurological: Negative for light-headedness and numbness.  Hematological: Does not bruise/bleed easily.  Psychiatric/Behavioral: Negative.       Objective:    Physical Exam  Constitutional: He is oriented to person, place, and time. He appears well-developed and well-nourished. No distress.  HENT:  Head: Normocephalic and atraumatic.  Right Ear: External ear normal.  Left Ear: External ear normal.  Mouth/Throat: Oropharynx is clear and moist. No oropharyngeal exudate.  Eyes: Pupils are equal, round, and reactive to light. Conjunctivae are normal. Right eye exhibits no discharge. Left eye exhibits no discharge. No scleral icterus.  Neck: Neck supple. No JVD present. No tracheal deviation present. No thyromegaly present.  Cardiovascular: Normal rate, regular rhythm and normal heart sounds.  Pulmonary/Chest: Effort normal and breath sounds normal. No stridor.  Abdominal: Soft. Bowel sounds are normal. He exhibits no distension. There is no abdominal tenderness. There is no rebound and no guarding.  Musculoskeletal: Normal range of motion.        General: No edema.  Lymphadenopathy:    He has no cervical adenopathy.   Neurological: He is alert and oriented to person, place, and time.  Skin: Skin is warm and dry. He is not diaphoretic.  Psychiatric: He has a normal mood and affect. His behavior is normal.    BP 128/90   Pulse 87   Ht 6' (1.829 m)   Wt 220 lb (99.8 kg)   SpO2 98%   BMI 29.84 kg/m  Wt Readings from Last 3 Encounters:  12/16/18 220 lb (99.8 kg)  11/23/18 218 lb (  98.9 kg)  11/08/18 218 lb 9.6 oz (99.2 kg)   BP Readings from Last 3 Encounters:  12/16/18 128/90  11/23/18 107/74  10/04/18 122/80   Guideline developer:  UpToDate (see UpToDate for funding source) Date Released: June 2014  Health Maintenance Due  Topic Date Due  . INFLUENZA VACCINE  10/29/2018    There are no preventive care reminders to display for this patient.  Lab Results  Component Value Date   TSH 0.995 06/22/2016   Lab Results  Component Value Date   WBC 4.9 08/20/2017   HGB 14.2 08/20/2017   HCT 44.0 08/20/2017   MCV 85 08/20/2017   PLT 242 08/20/2017   Lab Results  Component Value Date   NA 140 08/20/2017   K 3.6 08/20/2017   CO2 24 08/20/2017   GLUCOSE 109 (H) 08/20/2017   BUN 11 08/20/2017   CREATININE 0.86 08/20/2017   BILITOT 0.4 08/20/2017   ALKPHOS 61 08/20/2017   AST 23 08/20/2017   ALT 15 08/20/2017   PROT 7.3 08/20/2017   ALBUMIN 4.6 08/20/2017   CALCIUM 9.8 08/20/2017   Lab Results  Component Value Date   CHOL 165 08/20/2017   Lab Results  Component Value Date   HDL 61 08/20/2017   Lab Results  Component Value Date   LDLCALC 88 08/20/2017   Lab Results  Component Value Date   TRIG 81 08/20/2017   Lab Results  Component Value Date   CHOLHDL 2.7 08/20/2017   Lab Results  Component Value Date   HGBA1C 5.4 02/13/2013      Assessment & Plan:   Problem List Items Addressed This Visit      Cardiovascular and Mediastinum   Essential hypertension - Primary   Relevant Orders   CBC   Comprehensive metabolic panel   Urinalysis, Routine w reflex microscopic      Other   History of prostate cancer (Chronic)   Relevant Orders   PSA   Healthcare maintenance   Relevant Orders   Lipid panel   Chronic midline low back pain without sciatica   Relevant Medications   ibuprofen (ADVIL) 800 MG tablet      Meds ordered this encounter  Medications  . ibuprofen (ADVIL) 800 MG tablet    Sig: Take one every 8 hours with food as needed.    Dispense:  90 tablet    Refill:  0    Follow-up: Return in about 6 months (around 06/15/2019), or if symptoms worsen or fail to improve, for return fasting for blood work .

## 2018-12-16 NOTE — Telephone Encounter (Signed)
Rx was sent to pharmacy by another provider

## 2018-12-16 NOTE — Addendum Note (Signed)
Addended by: Rodrigo Ran on: 12/16/2018 10:44 AM   Modules accepted: Orders

## 2018-12-16 NOTE — Patient Instructions (Signed)

## 2018-12-23 ENCOUNTER — Other Ambulatory Visit: Payer: Medicare Other

## 2018-12-28 ENCOUNTER — Other Ambulatory Visit: Payer: Medicare Other

## 2019-01-13 ENCOUNTER — Encounter: Payer: Self-pay | Admitting: Family Medicine

## 2019-01-13 ENCOUNTER — Other Ambulatory Visit: Payer: Medicare Other

## 2019-01-13 ENCOUNTER — Ambulatory Visit (INDEPENDENT_AMBULATORY_CARE_PROVIDER_SITE_OTHER): Payer: Medicare Other

## 2019-01-13 ENCOUNTER — Other Ambulatory Visit: Payer: Self-pay

## 2019-01-13 ENCOUNTER — Ambulatory Visit (INDEPENDENT_AMBULATORY_CARE_PROVIDER_SITE_OTHER): Payer: Medicare Other | Admitting: Family Medicine

## 2019-01-13 VITALS — BP 130/80 | HR 85 | Temp 96.8°F | Ht 72.0 in | Wt 219.0 lb

## 2019-01-13 DIAGNOSIS — M19041 Primary osteoarthritis, right hand: Secondary | ICD-10-CM

## 2019-01-13 DIAGNOSIS — M25512 Pain in left shoulder: Secondary | ICD-10-CM | POA: Insufficient documentation

## 2019-01-13 DIAGNOSIS — M79644 Pain in right finger(s): Secondary | ICD-10-CM | POA: Insufficient documentation

## 2019-01-13 DIAGNOSIS — I10 Essential (primary) hypertension: Secondary | ICD-10-CM

## 2019-01-13 DIAGNOSIS — M778 Other enthesopathies, not elsewhere classified: Secondary | ICD-10-CM | POA: Insufficient documentation

## 2019-01-13 DIAGNOSIS — Z8546 Personal history of malignant neoplasm of prostate: Secondary | ICD-10-CM | POA: Diagnosis not present

## 2019-01-13 DIAGNOSIS — M109 Gout, unspecified: Secondary | ICD-10-CM | POA: Insufficient documentation

## 2019-01-13 DIAGNOSIS — Z0000 Encounter for general adult medical examination without abnormal findings: Secondary | ICD-10-CM | POA: Diagnosis not present

## 2019-01-13 LAB — COMPREHENSIVE METABOLIC PANEL
ALT: 14 U/L (ref 0–53)
AST: 16 U/L (ref 0–37)
Albumin: 4.5 g/dL (ref 3.5–5.2)
Alkaline Phosphatase: 61 U/L (ref 39–117)
BUN: 14 mg/dL (ref 6–23)
CO2: 29 mEq/L (ref 19–32)
Calcium: 9.7 mg/dL (ref 8.4–10.5)
Chloride: 101 mEq/L (ref 96–112)
Creatinine, Ser: 0.94 mg/dL (ref 0.40–1.50)
GFR: 96.78 mL/min (ref >60.00)
Glucose, Bld: 99 mg/dL (ref 70–99)
Potassium: 3.8 mEq/L (ref 3.5–5.1)
Sodium: 139 mEq/L (ref 135–145)
Total Bilirubin: 0.4 mg/dL (ref 0.2–1.2)
Total Protein: 7.2 g/dL (ref 6.0–8.3)

## 2019-01-13 LAB — URINALYSIS, ROUTINE W REFLEX MICROSCOPIC
Bilirubin Urine: NEGATIVE
Hgb urine dipstick: NEGATIVE
Ketones, ur: NEGATIVE
Leukocytes,Ua: NEGATIVE
Nitrite: NEGATIVE
RBC / HPF: NONE SEEN (ref 0–?)
Specific Gravity, Urine: 1.02 (ref 1.000–1.030)
Total Protein, Urine: NEGATIVE
Urine Glucose: NEGATIVE
Urobilinogen, UA: 0.2 (ref 0.0–1.0)
WBC, UA: NONE SEEN (ref 0–?)
pH: 7 (ref 5.0–8.0)

## 2019-01-13 LAB — CBC
HCT: 42.5 % (ref 39.0–52.0)
Hemoglobin: 14.3 g/dL (ref 13.0–17.0)
MCHC: 33.5 g/dL (ref 30.0–36.0)
MCV: 82.8 fl (ref 78.0–100.0)
Platelets: 214 10*3/uL (ref 150.0–400.0)
RBC: 5.14 Mil/uL (ref 4.22–5.81)
RDW: 13.5 % (ref 11.5–15.5)
WBC: 5.2 10*3/uL (ref 4.0–10.5)

## 2019-01-13 LAB — LIPID PANEL
Cholesterol: 188 mg/dL (ref 0–200)
HDL: 59.4 mg/dL (ref >39.00)
LDL Cholesterol: 111 mg/dL — ABNORMAL HIGH (ref 0–99)
NonHDL: 128.18
Total CHOL/HDL Ratio: 3
Triglycerides: 84 mg/dL (ref 0.0–149.0)
VLDL: 16.8 mg/dL (ref 0.0–40.0)

## 2019-01-13 LAB — PSA: PSA: 0 ng/mL — ABNORMAL LOW (ref 0.10–4.00)

## 2019-01-13 NOTE — Patient Instructions (Signed)
Zoster Vaccine, Recombinant injection What is this medicine? ZOSTER VACCINE (ZOS ter vak SEEN) is used to prevent shingles in adults 67 years old and over. This vaccine is not used to treat shingles or nerve pain from shingles. This medicine may be used for other purposes; ask your health care provider or pharmacist if you have questions. COMMON BRAND NAME(S): SHINGRIX What should I tell my health care provider before I take this medicine? They need to know if you have any of these conditions:  blood disorders or disease  cancer like leukemia or lymphoma  immune system problems or therapy  an unusual or allergic reaction to vaccines, other medications, foods, dyes, or preservatives  pregnant or trying to get pregnant  breast-feeding How should I use this medicine? This vaccine is for injection in a muscle. It is given by a health care professional. Talk to your pediatrician regarding the use of this medicine in children. This medicine is not approved for use in children. Overdosage: If you think you have taken too much of this medicine contact a poison control center or emergency room at once. NOTE: This medicine is only for you. Do not share this medicine with others. What if I miss a dose? Keep appointments for follow-up (booster) doses as directed. It is important not to miss your dose. Call your doctor or health care professional if you are unable to keep an appointment. What may interact with this medicine?  medicines that suppress your immune system  medicines to treat cancer  steroid medicines like prednisone or cortisone This list may not describe all possible interactions. Give your health care provider a list of all the medicines, herbs, non-prescription drugs, or dietary supplements you use. Also tell them if you smoke, drink alcohol, or use illegal drugs. Some items may interact with your medicine. What should I watch for while using this medicine? Visit your doctor for  regular check ups. This vaccine, like all vaccines, may not fully protect everyone. What side effects may I notice from receiving this medicine? Side effects that you should report to your doctor or health care professional as soon as possible:  allergic reactions like skin rash, itching or hives, swelling of the face, lips, or tongue  breathing problems Side effects that usually do not require medical attention (report these to your doctor or health care professional if they continue or are bothersome):  chills  headache  fever  nausea, vomiting  redness, warmth, pain, swelling or itching at site where injected  tiredness This list may not describe all possible side effects. Call your doctor for medical advice about side effects. You may report side effects to FDA at 1-800-FDA-1088. Where should I keep my medicine? This vaccine is only given in a clinic, pharmacy, doctor's office, or other health care setting and will not be stored at home. NOTE: This sheet is a summary. It may not cover all possible information. If you have questions about this medicine, talk to your doctor, pharmacist, or health care provider.  2020 Elsevier/Gold Standard (2016-10-26 13:20:30)  

## 2019-01-13 NOTE — Progress Notes (Signed)
Established Patient Office Visit  Subjective:  Patient ID: Jason Valencia, male    DOB: 07/12/1950  Age: 68 y.o. MRN: [id number]  CC:  Chief Complaint  Patient presents with  . right hand middle finger swollen  . left shoulder pain    HPI Jason Valencia presents for evaluation swelling and discomfort in his proximal right middle finger.  Denies injury.  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  He has also been having left shoulder pain.  Pain is located mostly in the anterior part of his shoulder.  No recent injury.  He is status post rotator cuff repair 10 years ago in this joint.  He uses ibuprofen 800 mg sparingly for these and his other orthopedic issues  Past Medical History:  Diagnosis Date  . Cancer [facility])    prostate cancer- 2011  . Hypertension     Past Surgical History:  Procedure Laterality Date  . COLONOSCOPY    . KNEE SURGERY    . PROSTATE SURGERY    . ROTATOR CUFF REPAIR     left    Family History  Problem Relation Age of Onset  . Cancer Mother        ovarian  . Heart disease Father   . Cancer Sister        throat   . Colon cancer Neg Hx   . Esophageal cancer Neg Hx   . Rectal cancer Neg Hx   . Stomach cancer Neg Hx     Social History   Socioeconomic History  . Marital status: Married    Spouse name: Not on file  . Number of children: 2  . Years of education: Not on file  . Highest education level: Not on file  Occupational History  . Not on file  Social Needs  . Financial resource strain: Not on file  . Food insecurity    Worry: Not on file    Inability: Not on file  . Transportation needs    Medical: Not on file    Non-medical: Not on file  Tobacco Use  . Smoking status: Never Smoker  . Smokeless tobacco: Never Used  Substance and Sexual Activity  . Alcohol use: Yes    Alcohol/week: 2.0 standard drinks    Types: 2 Standard drinks or equivalent per week  . Drug  use: No  . Sexual activity: Yes  Lifestyle  . Physical activity    Days per week: Not on file    Minutes per session: Not on file  . Stress: Not on file  Relationships  . Social Herbalist on phone: Not on file    Gets together: Not on file    Attends religious service: Not on file    Active member of club or organization: Not on file    Attends meetings of clubs or organizations: Not on file    Relationship status: Not on file  . Intimate partner violence    Fear of current or ex partner: Not on file    Emotionally abused: Not on file    Physically abused: Not on file    Forced sexual activity: Not on file  Other Topics Concern  . Not on file  Social History Narrative  . Not on file    Outpatient Medications Prior to Visit  Medication Sig Dispense Refill  . cyclobenzaprine (FLEXERIL) 5 MG tablet TAKE 1 TABLET BY MOUTH  EVERY 8 HOURS AS NEEDED. START WITH 1 TABLET AT BEDTIME AS NEEDED DUE TO SEDATION 30 tablet 0  . HYDROcodone-acetaminophen (NORCO/VICODIN) 5-325 MG tablet Take 1 tablet by mouth every 6 (six) hours as needed for moderate pain. 20 tablet 0  . hydrOXYzine (ATARAX/VISTARIL) 50 MG tablet TAKE 1 TO 2 TABLETS BY MOUTH EVERY NIGHT 30 MINUTES BEFORE BEDTIME AS NEEDED 60 tablet 0  . ibuprofen (ADVIL) 800 MG tablet Take one every 8 hours with food as needed. 90 tablet 0  . meloxicam (MOBIC) 15 MG tablet Take 1 tablet (15 mg total) by mouth daily. 15 tablet 0  . methocarbamol (ROBAXIN-750) 750 MG tablet Take 1 tablet (750 mg total) by mouth 4 (four) times daily. (Patient taking differently: Take 750 mg by mouth as needed. ) 40 tablet 3  . sildenafil (REVATIO) 20 MG tablet Take 1 tablet (20 mg total) by mouth 3 (three) times daily. 60 tablet 6  . amLODipine (NORVASC) 10 MG tablet Take 1 tablet (10 mg total) by mouth daily. 90 tablet 3  . lisinopril-hydrochlorothiazide (ZESTORETIC) 20-25 MG tablet Take 1 tablet by mouth daily. 90 tablet 3   No facility-administered  medications prior to visit.     No Known Allergies  ROS Review of Systems  Constitutional: Negative.   Respiratory: Negative.   Cardiovascular: Negative.   Gastrointestinal: Negative.   Musculoskeletal: Positive for arthralgias, back pain and joint swelling.  Neurological: Negative for weakness and numbness.  Hematological: Does not bruise/bleed easily.  Psychiatric/Behavioral: Negative.       Objective:    Physical Exam  Constitutional: He is oriented to person, place, and time. He appears well-developed and well-nourished. No distress.  HENT:  Head: Normocephalic and atraumatic.  Right Ear: External ear normal.  Left Ear: External ear normal.  Eyes: Conjunctivae are normal. Right eye exhibits no discharge. Left eye exhibits no discharge. No scleral icterus.  Pulmonary/Chest: Effort normal.  Neurological: He is alert and oriented to person, place, and time.  Skin: Skin is warm and dry. He is not diaphoretic.  Psychiatric: He has a normal mood and affect. His behavior is normal.    BP 130/80   Pulse 85   Temp (!) 96.8 F (36 C) (Temporal)   Ht 6' (1.829 m)   Wt 219 lb (99.3 kg)   SpO2 99%   BMI 29.70 kg/m  Wt Readings from Last 3 Encounters:  01/13/19 219 lb (99.3 kg)  12/16/18 220 lb (99.8 kg)  11/23/18 218 lb (98.9 kg)   BP Readings from Last 3 Encounters:  01/13/19 130/80  12/16/18 128/90  11/23/18 107/74   Guideline developer:  UpToDate (see UpToDate for funding source) Date Released: June 2014  There are no preventive care reminders to display for this patient.  There are no preventive care reminders to display for this patient.  Lab Results  Component Value Date   TSH 0.995 06/22/2016   Lab Results  Component Value Date   WBC 4.9 08/20/2017   HGB 14.2 08/20/2017   HCT 44.0 08/20/2017   MCV 85 08/20/2017   PLT 242 08/20/2017   Lab Results  Component Value Date   NA 140 08/20/2017   K 3.6 08/20/2017   CO2 24 08/20/2017   GLUCOSE 109 (H)  08/20/2017   BUN 11 08/20/2017   CREATININE 0.86 08/20/2017   BILITOT 0.4 08/20/2017   ALKPHOS 61 08/20/2017   AST 23 08/20/2017   ALT 15 08/20/2017   PROT 7.3 08/20/2017   ALBUMIN 4.6  08/20/2017   CALCIUM 9.8 08/20/2017   Lab Results  Component Value Date   CHOL 165 08/20/2017   Lab Results  Component Value Date   HDL 61 08/20/2017   Lab Results  Component Value Date   LDLCALC 88 08/20/2017   Lab Results  Component Value Date   TRIG 81 08/20/2017   Lab Results  Component Value Date   CHOLHDL 2.7 08/20/2017   Lab Results  Component Value Date   HGBA1C 5.4 02/13/2013      Assessment & Plan:   Problem List Items Addressed This Visit      Cardiovascular and Mediastinum   Essential hypertension     Musculoskeletal and Integument   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   Follow-up: Return if symptoms worsen or fail to improve.   Patient was given information about the Shingrix vaccine and I recommended that he have that through his local pharmacy.

## 2019-01-24 ENCOUNTER — Other Ambulatory Visit: Payer: Self-pay

## 2019-01-24 DIAGNOSIS — G8929 Other chronic pain: Secondary | ICD-10-CM

## 2019-01-24 DIAGNOSIS — M545 Low back pain, unspecified: Secondary | ICD-10-CM

## 2019-01-24 MED ORDER — IBUPROFEN 800 MG PO TABS: ORAL_TABLET | ORAL | 11 refills | Status: DC

## 2019-01-27 ENCOUNTER — Other Ambulatory Visit: Payer: Self-pay | Admitting: Family Medicine

## 2019-01-27 DIAGNOSIS — N5231 Erectile dysfunction following radical prostatectomy: Secondary | ICD-10-CM

## 2019-01-27 NOTE — Telephone Encounter (Signed)
Copied from [place] [phone]. Topic: Quick Communication - Rx Refill/Question >> Jan 27, 2019  4:58 PM Mcneil, Ja-Kwan wrote: Medication: sildenafil (REVATIO) 20 MG tablet  Has the patient contacted their pharmacy? no  Preferred Pharmacy (with phone number or street name): [facility] [id number] - [place], [address] AT [place] [phone] (Phone)  [phone] (Fax)  Agent: Please be advised that RX refills may take up to 3 business days. We ask that you follow-up with your pharmacy.

## 2019-01-27 NOTE — Telephone Encounter (Signed)
Requested medication (s) are due for refill today: yes  Requested medication (s) are on the active medication list: yes  Last refill:  Historic provider  Future visit scheduled: No  Notes to clinic:Historic provider    Requested Prescriptions  Pending Prescriptions Disp Refills   sildenafil (REVATIO) 20 MG tablet 60 tablet 6    Sig: Take 1 tablet (20 mg total) by mouth 3 (three) times daily.     Urology: Erectile Dysfunction Agents Passed - 01/27/2019  5:05 PM      Passed - Last BP in normal range    BP Readings from Last 1 Encounters:  01/13/19 130/80         Passed - Valid encounter within last 12 months    Recent Outpatient Visits          2 weeks ago Acute pain of left shoulder   [facility], Mortimer Fries, MD   1 month ago Essential hypertension   [facility], Mortimer Fries, MD   3 months ago Medicare annual wellness visit, subsequent   [facility] at [facility], [place], [place]   4 months ago Hypertension, unspecified type   Primary Care at [facility], [place], [place]   9 months ago Spinal stenosis of lumbar region, unspecified whether neurogenic claudication present   Primary Care at [facility], Ines Bloomer, MD

## 2019-01-30 MED ORDER — SILDENAFIL CITRATE 20 MG PO TABS: ORAL_TABLET | ORAL | 6 refills | Status: DC

## 2019-02-08 ENCOUNTER — Other Ambulatory Visit: Payer: Self-pay

## 2019-02-08 ENCOUNTER — Ambulatory Visit: Payer: Medicare Other | Admitting: Family Medicine

## 2019-02-08 ENCOUNTER — Encounter: Payer: Self-pay | Admitting: Family Medicine

## 2019-02-08 ENCOUNTER — Ambulatory Visit: Payer: Self-pay

## 2019-02-08 VITALS — BP 131/84 | HR 85 | Ht 72.0 in | Wt 216.0 lb

## 2019-02-08 DIAGNOSIS — M25512 Pain in left shoulder: Secondary | ICD-10-CM | POA: Diagnosis not present

## 2019-02-08 DIAGNOSIS — M79644 Pain in right finger(s): Secondary | ICD-10-CM

## 2019-02-08 MED ORDER — PREDNISONE 5 MG PO TABS: ORAL_TABLET | ORAL | 0 refills | Status: DC

## 2019-02-08 NOTE — Assessment & Plan Note (Signed)
The problems seems to be in the soft tissue of the proximal phalange. There isn't a synovitis in the 2nd or 3rd MCP joint. Unclear if possible infection. Doesn't seem to be associated with flexor tendon.  - prednisone  - buddy taping  - counseled on supportive care - consider trying course of antibiotic if no improvement.

## 2019-02-08 NOTE — Patient Instructions (Signed)
Nice to meet you Please try the exercises for the shoulder  Please try the medicine. Let me know if you don't have improvement of your finger  Please try buddy taping the fingers at night and during the day if you're active for the next two weeks.  Please send me a message in MyChart with any questions or updates.  Please see me back in 4 weeks.   --Dr. Raeford Razor

## 2019-02-08 NOTE — Assessment & Plan Note (Signed)
Pain seems more joint related. Rotator cuff is strong. History of Rotator cuff repair in 2010.  - counseled on HEP and supportive care - could consider injection or PT

## 2019-02-08 NOTE — Progress Notes (Signed)
Jason Valencia - 68 y.o. male MRN [id number]  Date of birth: [birth year]  SUBJECTIVE:  Including CC & ROS.  Chief Complaint  Patient presents with  . Shoulder Pain    left shoulder  . Hand Pain    right middle finger    Jason Valencia is a 68 y.o. male that is  Presenting with left shoulder pain and right middle finger pain. The left shoulder pain is anterior in nature. Intermittent and localized to the shoulder. History of rotator cuff surgery in 2010 from an injury at work. Pain is mild to moderate. Denies a specific inciting event.   The right middle finger pain is occurring in the proximal phalange. He denies an inciting event. The area is red and swollen when compared to the contralateral side. No history of gout. The pain is worse in the morning. pain has been occurring for one month. Pain is better through the course of the day. Has not tried anything of the pain.  Independent review of the right hand x-ray from 10/16 shows no acute abnormality.  Independent review of the left shoulder x-ray from 10/16 shows change of the distal clavicle.   Review of Systems  Constitutional: Negative for fever.  HENT: Negative for congestion.   Respiratory: Negative for cough.   Cardiovascular: Negative for chest pain.  Gastrointestinal: Negative for abdominal pain.  Musculoskeletal: Positive for myalgias.  Skin: Negative for rash.  Neurological: Negative for weakness.  Hematological: Negative for adenopathy.    HISTORY: Past Medical, Surgical, Social, and Family History Reviewed & Updated per EMR.   Pertinent Historical Findings include:  Past Medical History:  Diagnosis Date  . Cancer [facility])    prostate cancer- 2011  . Hypertension     Past Surgical History:  Procedure Laterality Date  . COLONOSCOPY    . KNEE SURGERY    . PROSTATE SURGERY    . ROTATOR CUFF REPAIR     left    No Known Allergies  Family History  Problem Relation Age of Onset  . Cancer Mother        ovarian   . Heart disease Father   . Cancer Sister        throat   . Colon cancer Neg Hx   . Esophageal cancer Neg Hx   . Rectal cancer Neg Hx   . Stomach cancer Neg Hx      Social History   Socioeconomic History  . Marital status: Married    Spouse name: Not on file  . Number of children: 2  . Years of education: Not on file  . Highest education level: Not on file  Occupational History  . Not on file  Social Needs  . Financial resource strain: Not on file  . Food insecurity    Worry: Not on file    Inability: Not on file  . Transportation needs    Medical: Not on file    Non-medical: Not on file  Tobacco Use  . Smoking status: Never Smoker  . Smokeless tobacco: Never Used  Substance and Sexual Activity  . Alcohol use: Yes    Alcohol/week: 2.0 standard drinks    Types: 2 Standard drinks or equivalent per week  . Drug use: No  . Sexual activity: Yes  Lifestyle  . Physical activity    Days per week: Not on file    Minutes per session: Not on file  . Stress: Not on file  Relationships  . Social connections  Talks on phone: Not on file    Gets together: Not on file    Attends religious service: Not on file    Active member of club or organization: Not on file    Attends meetings of clubs or organizations: Not on file    Relationship status: Not on file  . Intimate partner violence    Fear of current or ex partner: Not on file    Emotionally abused: Not on file    Physically abused: Not on file    Forced sexual activity: Not on file  Other Topics Concern  . Not on file  Social History Narrative  . Not on file     PHYSICAL EXAM:  VS: BP 131/84   Pulse 85   Ht 6' (1.829 m)   Wt 216 lb (98 kg)   BMI 29.29 kg/m  Physical Exam Gen: NAD, alert, cooperative with exam, well-appearing ENT: normal lips, normal nasal mucosa,  Eye: normal EOM, normal conjunctiva and lids CV:  no edema, +2 pedal pulses   Resp: no accessory muscle use, non-labored,  Skin: no rashes,  no areas of induration  Neuro: normal tone, normal sensation to touch Psych:  normal insight, alert and oriented MSK:  Left shoulder:  Normal active abduction and flexion  Normal IR and ER  Normal strength to resistance  Normal empty can testing  Normal Speed's test  No pain with O'Briens test  Right hand:  Middle proximal phalange with swelling and redness  Normal MCP joints with no redness  No triggering of fingers  Normal strength to resistance with finger adduction or abduction  No swelling of the 3rd PIP or DIP joint  NVI   Limited ultrasound: left shoulder, right middle finger:  Left shoulder:  Normal appearing BT  Normal appearing subscapularis.  There is a suggestion of an effusion in ER of the anterior GH joint  Normal appearing supraspinatus  No significant effusion the posterior GH joint.   Right middle finger:  Normal appearing 3rd MCP joint  Normal appearing 3rd PIP joint  3rd flexor tendon appears normal  The soft tissue of the flexor proximal phalange appears to have increase vascular uptake and soft tissue swelling.    Summary: left shoulder with suggestion of intra-articular process. Right middle finger would suggest an inflammatory or infection change of the proximal phalange of the 3rd right digit.   Ultrasound and interpretation by Clearance Coots, MD       ASSESSMENT & PLAN:   Pain of right middle finger The problems seems to be in the soft tissue of the proximal phalange. There isn't a synovitis in the 2nd or 3rd MCP joint. Unclear if possible infection. Doesn't seem to be associated with flexor tendon.  - prednisone  - buddy taping  - counseled on supportive care - consider trying course of antibiotic if no improvement.   Acute pain of left shoulder Pain seems more joint related. Rotator cuff is strong. History of Rotator cuff repair in 2010.  - counseled on HEP and supportive care - could consider injection or PT

## 2019-03-08 ENCOUNTER — Ambulatory Visit: Payer: Medicare Other | Admitting: Family Medicine

## 2019-04-05 ENCOUNTER — Encounter: Payer: Self-pay | Admitting: Family Medicine

## 2019-04-05 ENCOUNTER — Other Ambulatory Visit: Payer: Self-pay

## 2019-04-05 ENCOUNTER — Ambulatory Visit: Payer: Medicare Other | Admitting: Family Medicine

## 2019-04-05 VITALS — BP 149/89 | HR 87 | Ht 72.0 in | Wt 216.0 lb

## 2019-04-05 DIAGNOSIS — M79644 Pain in right finger(s): Secondary | ICD-10-CM

## 2019-04-05 DIAGNOSIS — M778 Other enthesopathies, not elsewhere classified: Secondary | ICD-10-CM | POA: Diagnosis not present

## 2019-04-05 MED ORDER — NAPROXEN 500 MG PO TABS: 500.0000 mg | ORAL_TABLET | Freq: Two times a day (BID) | ORAL | 1 refills | Status: AC | PRN

## 2019-04-05 NOTE — Patient Instructions (Signed)
Good to see you Please take the naproxen for 7 days and then as needed  I will call you with the results.   Please send me a message in MyChart with any questions or updates.  Please see me back in 6 weeks.   --Dr. Raeford Razor

## 2019-04-05 NOTE — Progress Notes (Signed)
Jason Valencia - 69 y.o. male MRN [id number]  Date of birth: January 16, 1951  SUBJECTIVE:  Including CC & ROS.  Chief Complaint  Patient presents with  . Follow-up    follow up for left shoulder and right middle finger    Jason Valencia is a 69 y.o. male that is following up for his left shoulder pain and right middle finger pain.  He was prescribed prednisone and had improvement of his symptoms.  His shoulder pain is very intermittent and mild currently.  He has been doing home exercises with no significant problems.  His middle finger has improved in terms of swelling.  He does still do notes swelling of the proximal phalanx.  He also has some swelling over the palmar aspect of the third MCP joint.  Has good range of motion with no triggering.   Review of Systems See HPI  HISTORY: Past Medical, Surgical, Social, and Family History Reviewed & Updated per EMR.   Pertinent Historical Findings include:  Past Medical History:  Diagnosis Date  . Cancer [facility])    prostate cancer- 2011  . Hypertension     Past Surgical History:  Procedure Laterality Date  . COLONOSCOPY    . KNEE SURGERY    . PROSTATE SURGERY    . ROTATOR CUFF REPAIR     left    No Known Allergies  Family History  Problem Relation Age of Onset  . Cancer Mother        ovarian  . Heart disease Father   . Cancer Sister        throat   . Colon cancer Neg Hx   . Esophageal cancer Neg Hx   . Rectal cancer Neg Hx   . Stomach cancer Neg Hx      Social History   Socioeconomic History  . Marital status: Married    Spouse name: Not on file  . Number of children: 2  . Years of education: Not on file  . Highest education level: Not on file  Occupational History  . Not on file  Tobacco Use  . Smoking status: Never Smoker  . Smokeless tobacco: Never Used  Substance and Sexual Activity  . Alcohol use: Yes    Alcohol/week: 2.0 standard drinks    Types: 2 Standard drinks or equivalent per week  . Drug use: No  .  Sexual activity: Yes  Other Topics Concern  . Not on file  Social History Narrative  . Not on file   Social Determinants of Health   Financial Resource Strain:   . Difficulty of Paying Living Expenses: Not on file  Food Insecurity:   . Worried About Charity fundraiser in the Last Year: Not on file  . Ran Out of Food in the Last Year: Not on file  Transportation Needs:   . Lack of Transportation (Medical): Not on file  . Lack of Transportation (Non-Medical): Not on file  Physical Activity:   . Days of Exercise per Week: Not on file  . Minutes of Exercise per Session: Not on file  Stress:   . Feeling of Stress : Not on file  Social Connections:   . Frequency of Communication with Friends and Family: Not on file  . Frequency of Social Gatherings with Friends and Family: Not on file  . Attends Religious Services: Not on file  . Active Member of Clubs or Organizations: Not on file  . Attends Archivist Meetings: Not on file  .  Marital Status: Not on file  Intimate Partner Violence:   . Fear of Current or Ex-Partner: Not on file  . Emotionally Abused: Not on file  . Physically Abused: Not on file  . Sexually Abused: Not on file     PHYSICAL EXAM:  VS: BP (!) 149/89   Pulse 87   Ht 6' (1.829 m)   Wt 216 lb (98 kg)   BMI 29.29 kg/m  Physical Exam Gen: NAD, alert, cooperative with exam, well-appearing ENT: normal lips, normal nasal mucosa,  Eye: normal EOM, normal conjunctiva and lids CV:  no edema, +2 pedal pulses   Resp: no accessory muscle use, non-labored,  Skin: no rashes, no areas of induration  Neuro: normal tone, normal sensation to touch Psych:  normal insight, alert and oriented MSK:  Left shoulder: Normal range of motion. Normal strength resistance. Right middle finger: Redness and swelling have improved. Normal flexion extension. Swelling still apparent at the proximal phalanx and palmar aspect of the MCP joint. Neurovascularly  intact     ASSESSMENT & PLAN:   Pain of right middle finger Has had a good response with the prednisone.  May be related to gout or another form of inflammatory change.  Not likely trigger finger in nature.  Seems less likely for infectious. -Uric acid. -Naproxen. -May need to consider injection.  Capsulitis of left shoulder Improvement with prednisone and home exercises. -Continue home exercises and supportive care -Could consider imaging and injection if needed.

## 2019-04-05 NOTE — Assessment & Plan Note (Signed)
Improvement with prednisone and home exercises. -Continue home exercises and supportive care -Could consider imaging and injection if needed.

## 2019-04-05 NOTE — Assessment & Plan Note (Addendum)
Has had a good response with the prednisone.  May be related to gout or another form of inflammatory change.  Not likely trigger finger in nature.  Seems less likely for infectious. -Uric acid. -Naproxen. -May need to consider injection.

## 2019-04-06 ENCOUNTER — Telehealth: Payer: Self-pay | Admitting: Family Medicine

## 2019-04-06 LAB — URIC ACID: Uric Acid: 7.2 mg/dL (ref 3.8–8.4)

## 2019-04-06 NOTE — Telephone Encounter (Signed)
Left VM for patient. If he calls back please have him speak with a nurse/CMA and inform that his uric acid was elevated.  We could try colchicine in the interim for more of an acute flare.  Long-term allopurinol will be used to help lower the uric acid.   If any questions then please take the best time and phone number to call and I will try to call him back.   Rosemarie Ax, MD [facility] 04/06/2019, 9:56 AM

## 2019-04-11 ENCOUNTER — Telehealth: Payer: Self-pay | Admitting: Family Medicine

## 2019-04-11 MED ORDER — COLCHICINE 0.6 MG PO TABS: 0.6000 mg | ORAL_TABLET | Freq: Two times a day (BID) | ORAL | 2 refills | Status: DC

## 2019-04-11 NOTE — Telephone Encounter (Signed)
Sent in colchicine.  Rosemarie Ax, MD [facility] 04/11/2019, 2:34 PM

## 2019-04-11 NOTE — Telephone Encounter (Signed)
Patient returning call for results 

## 2019-04-11 NOTE — Telephone Encounter (Signed)
CMA spoke with patient. He would like to try colchicine. Requesting prescription be sent to [facility] on [address].   Patient stated he puts tumeric in his coffee. He asked if he can continue this while taking colchicine

## 2019-04-18 ENCOUNTER — Other Ambulatory Visit: Payer: Self-pay | Admitting: Family Medicine

## 2019-05-17 ENCOUNTER — Ambulatory Visit: Payer: Medicare Other | Admitting: Family Medicine

## 2019-05-17 ENCOUNTER — Ambulatory Visit: Payer: Self-pay

## 2019-05-17 ENCOUNTER — Other Ambulatory Visit: Payer: Self-pay

## 2019-05-17 ENCOUNTER — Encounter: Payer: Self-pay | Admitting: Family Medicine

## 2019-05-17 VITALS — BP 138/83 | HR 85 | Ht 72.0 in | Wt 215.0 lb

## 2019-05-17 DIAGNOSIS — M10041 Idiopathic gout, right hand: Secondary | ICD-10-CM | POA: Diagnosis not present

## 2019-05-17 DIAGNOSIS — M23303 Other meniscus derangements, unspecified medial meniscus, right knee: Secondary | ICD-10-CM | POA: Diagnosis not present

## 2019-05-17 DIAGNOSIS — M25561 Pain in right knee: Secondary | ICD-10-CM

## 2019-05-17 MED ORDER — ALLOPURINOL 100 MG PO TABS: 100.0000 mg | ORAL_TABLET | Freq: Every day | ORAL | 3 refills | Status: DC

## 2019-05-17 MED ORDER — PREDNISONE 5 MG PO TABS: ORAL_TABLET | ORAL | 0 refills | Status: DC

## 2019-05-17 NOTE — Assessment & Plan Note (Signed)
Pain likely result of gout.  Has had improvement while on colchicine.  Uric acid was elevated. -Stop colchicine and counseled on use with acute attacks. -Initiate allopurinol.  Provided prednisone to take at the beginning as well. -Follow-up in few months to recheck uric acid.

## 2019-05-17 NOTE — Progress Notes (Addendum)
Jason Valencia - 69 y.o. male MRN [id number]  Date of birth: 1950-06-25  SUBJECTIVE:  Including CC & ROS.  Chief Complaint  Patient presents with  . Follow-up    follow up for right middle finger    Jason Valencia is a 69 y.o. male that is following up for his right finger pain and presenting with right knee pain.  He feels that his finger has improved significantly since starting colchicine.  He still feels some tightness from time to time but overall is improved.  Denies any significant redness.  Having right knee pain over the medial compartment.  It is worse with getting up from a seated position.  He Valencia walk without pain.  He may have some pain afterwards.  Seems to be localized in this position.  No inciting event or trauma..  Review of the uric acid from 1/6 shows an elevation of 7.2.   Review of Systems See HPI   HISTORY: Past Medical, Surgical, Social, and Family History Reviewed & Updated per EMR.   Pertinent Historical Findings include:  Past Medical History:  Diagnosis Date  . Cancer [facility])    prostate cancer- 2011  . Hypertension     Past Surgical History:  Procedure Laterality Date  . COLONOSCOPY    . KNEE SURGERY    . PROSTATE SURGERY    . ROTATOR CUFF REPAIR     left    Family History  Problem Relation Age of Onset  . Cancer Mother        ovarian  . Heart disease Father   . Cancer Sister        throat   . Colon cancer Neg Hx   . Esophageal cancer Neg Hx   . Rectal cancer Neg Hx   . Stomach cancer Neg Hx     Social History   Socioeconomic History  . Marital status: Married    Spouse name: Not on file  . Number of children: 2  . Years of education: Not on file  . Highest education level: Not on file  Occupational History  . Not on file  Tobacco Use  . Smoking status: Never Smoker  . Smokeless tobacco: Never Used  Substance and Sexual Activity  . Alcohol use: Yes    Alcohol/week: 2.0 standard drinks    Types: 2 Standard drinks or  equivalent per week  . Drug use: No  . Sexual activity: Yes  Other Topics Concern  . Not on file  Social History Narrative  . Not on file   Social Determinants of Health   Financial Resource Strain:   . Difficulty of Paying Living Expenses: Not on file  Food Insecurity:   . Worried About Charity fundraiser in the Last Year: Not on file  . Ran Out of Food in the Last Year: Not on file  Transportation Needs:   . Lack of Transportation (Medical): Not on file  . Lack of Transportation (Non-Medical): Not on file  Physical Activity:   . Days of Exercise per Week: Not on file  . Minutes of Exercise per Session: Not on file  Stress:   . Feeling of Stress : Not on file  Social Connections:   . Frequency of Communication with Friends and Family: Not on file  . Frequency of Social Gatherings with Friends and Family: Not on file  . Attends Religious Services: Not on file  . Active Member of Clubs or Organizations: Not on file  . Attends  Club or Organization Meetings: Not on file  . Marital Status: Not on file  Intimate Partner Violence:   . Fear of Current or Ex-Partner: Not on file  . Emotionally Abused: Not on file  . Physically Abused: Not on file  . Sexually Abused: Not on file     PHYSICAL EXAM:  VS: BP 138/83   Pulse 85   Ht 6' (1.829 m)   Wt 215 lb (97.5 kg)   BMI 29.16 kg/m  Physical Exam Gen: NAD, alert, cooperative with exam, well-appearing MSK:  Right hand: Normal flexion extension of the fingers. No redness of the proximal phalange of the middle finger. No significant redness. Right knee: No obvious effusion. Normal range of motion. Tenderness to palpation of the medial joint line. Negative McMurray's test. Neurovascularly intact  Limited ultrasound: Right knee:  No effusion within the suprapatellar pouch. Normal-appearing quadricep and patellar tendon. Medial joint space narrowing with degenerative changes of the medial meniscus  Summary:  Degenerative changes appreciated the medial compartment  Ultrasound and interpretation by Clearance Coots, MD    ASSESSMENT & PLAN:   Gout Pain likely result of gout.  Has had improvement while on colchicine.  Uric acid was elevated. -Stop colchicine and counseled on use with acute attacks. -Initiate allopurinol.  Provided prednisone to take at the beginning as well. -Follow-up in few months to recheck uric acid.  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 -Could consider injection imaging or physical therapy.

## 2019-05-17 NOTE — Assessment & Plan Note (Signed)
Findings would suggest he has degenerative changes of the medial meniscus.  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 -Could consider injection imaging or physical therapy.

## 2019-05-17 NOTE — Patient Instructions (Signed)
Good to see you Please start the allopurinol and prednisone at the same time. Please use the dosing for acute attacks.  You can stop it in the meantime. Please try the exercises for the knee pain. Please try over-the-counter Voltaren Please send me a message in MyChart with any questions or updates.  Please see me back in 6-8 weeks.   --Dr. Raeford Razor

## 2019-06-02 ENCOUNTER — Ambulatory Visit: Payer: Medicare Other | Attending: Internal Medicine

## 2019-06-02 DIAGNOSIS — Z23 Encounter for immunization: Secondary | ICD-10-CM

## 2019-06-02 NOTE — Progress Notes (Signed)
   Covid-19 Vaccination Clinic  Name:  Jason Valencia    MRN: [id number] DOB: 04-Sep-1950  06/02/2019  Mr. [name] was observed post Covid-19 immunization for 15 minutes without incident. He was provided with Vaccine Information Sheet and instruction to access the V-Safe system.   Mr. [name] was instructed to call 911 with any severe reactions post vaccine: Marland Kitchen Difficulty breathing  . Swelling of face and throat  . A fast heartbeat  . A bad rash all over body  . Dizziness and weakness   Immunizations Administered    Name Date Dose VIS Date Route   Pfizer COVID-19 Vaccine 06/02/2019  9:53 AM 0.3 mL 03/10/2019 Intramuscular   Manufacturer: [place]   Lot: [id number]   [place]: [id number]

## 2019-06-22 ENCOUNTER — Other Ambulatory Visit: Payer: Self-pay | Admitting: Family Medicine

## 2019-06-22 DIAGNOSIS — G479 Sleep disorder, unspecified: Secondary | ICD-10-CM

## 2019-06-28 ENCOUNTER — Ambulatory Visit: Payer: Medicare Other | Attending: Internal Medicine

## 2019-06-28 ENCOUNTER — Ambulatory Visit: Payer: Medicare Other | Admitting: Family Medicine

## 2019-06-28 DIAGNOSIS — Z23 Encounter for immunization: Secondary | ICD-10-CM

## 2019-06-28 NOTE — Progress Notes (Signed)
   Covid-19 Vaccination Clinic  Name:  Jason Valencia    MRN: [id number] DOB: July 08, 1950  06/28/2019  Mr. [name] was observed post Covid-19 immunization for 15 minutes without incident. He was provided with Vaccine Information Sheet and instruction to access the V-Safe system.   Mr. [name] was instructed to call 911 with any severe reactions post vaccine: Marland Kitchen Difficulty breathing  . Swelling of face and throat  . A fast heartbeat  . A bad rash all over body  . Dizziness and weakness   Immunizations Administered    Name Date Dose VIS Date Route   Pfizer COVID-19 Vaccine 06/28/2019  9:27 AM 0.3 mL 03/10/2019 Intramuscular   Manufacturer: [place]   Lot: [id number]   [place]: [id number]

## 2019-07-05 ENCOUNTER — Ambulatory Visit: Payer: Medicare Other | Admitting: Family Medicine

## 2019-07-05 ENCOUNTER — Other Ambulatory Visit: Payer: Self-pay

## 2019-07-05 ENCOUNTER — Encounter: Payer: Self-pay | Admitting: Family Medicine

## 2019-07-05 VITALS — BP 144/77 | HR 92 | Ht 72.0 in | Wt 216.0 lb

## 2019-07-05 DIAGNOSIS — M10041 Idiopathic gout, right hand: Secondary | ICD-10-CM | POA: Diagnosis not present

## 2019-07-05 DIAGNOSIS — M76821 Posterior tibial tendinitis, right leg: Secondary | ICD-10-CM | POA: Insufficient documentation

## 2019-07-05 NOTE — Assessment & Plan Note (Signed)
Has had improvement with allopurinol and has only mild swelling in limitation flexion. -Check uric acid.  May need to increase the dosage.  May need to add prednisone if increasing the doses. -May need to consider injection if no improvement.

## 2019-07-05 NOTE — Assessment & Plan Note (Signed)
He has swelling over the tarsal tunnel and tenderness.  Had an injury with swelling.  No weakness and no ecchymosis.  May have damaged the retinaculum in that area.  Has been gotten better. -Heel lifts. -Counseled on compression. -Counseled home exercise therapy and supportive care. -Could consider boot or physical therapy or imaging if needed.

## 2019-07-05 NOTE — Patient Instructions (Signed)
Good to see you Please try the heel lift  Please try compression sock for the right ankle  I will call with the results   Please send me a message in MyChart with any questions or updates.  Please see me back in 6-8 weeks.   --Dr. Raeford Razor

## 2019-07-05 NOTE — Progress Notes (Signed)
Jason Valencia - 69 y.o. male MRN [id number]  Date of birth: 11/15/1950  SUBJECTIVE:  Including CC & ROS.  Chief Complaint  Patient presents with  . Follow-up    follow up for right knee    Jason Valencia is a 69 y.o. male that is having some acute pain of the right middle finger as well as right medial hindfoot pain.  He has gotten improvement with the allopurinol and controlling of the gout as his finger has been less red and swollen.  He still has swelling from time to time.  He also was stepping awkwardly and noticed some pain in the medial aspect of the hindfoot.  It seemed to be coursing around the posterior tibialis.  Has some swelling in this area.  No ecchymosis.  Has gotten better over the course of time.  Has not taken anything for it.   Review of Systems See HPI   HISTORY: Past Medical, Surgical, Social, and Family History Reviewed & Updated per EMR.   Pertinent Historical Findings include:  Past Medical History:  Diagnosis Date  . Cancer [facility])    prostate cancer- 2011  . Hypertension     Past Surgical History:  Procedure Laterality Date  . COLONOSCOPY    . KNEE SURGERY    . PROSTATE SURGERY    . ROTATOR CUFF REPAIR     left    Family History  Problem Relation Age of Onset  . Cancer Mother        ovarian  . Heart disease Father   . Cancer Sister        throat   . Colon cancer Neg Hx   . Esophageal cancer Neg Hx   . Rectal cancer Neg Hx   . Stomach cancer Neg Hx     Social History   Socioeconomic History  . Marital status: Married    Spouse name: Not on file  . Number of children: 2  . Years of education: Not on file  . Highest education level: Not on file  Occupational History  . Not on file  Tobacco Use  . Smoking status: Never Smoker  . Smokeless tobacco: Never Used  Substance and Sexual Activity  . Alcohol use: Yes    Alcohol/week: 2.0 standard drinks    Types: 2 Standard drinks or equivalent per week  . Drug use: No  . Sexual activity:  Yes  Other Topics Concern  . Not on file  Social History Narrative  . Not on file   Social Determinants of Health   Financial Resource Strain:   . Difficulty of Paying Living Expenses:   Food Insecurity:   . Worried About Charity fundraiser in the Last Year:   . Arboriculturist in the Last Year:   Transportation Needs:   . Film/video editor (Medical):   Marland Kitchen Lack of Transportation (Non-Medical):   Physical Activity:   . Days of Exercise per Week:   . Minutes of Exercise per Session:   Stress:   . Feeling of Stress :   Social Connections:   . Frequency of Communication with Friends and Family:   . Frequency of Social Gatherings with Friends and Family:   . Attends Religious Services:   . Active Member of Clubs or Organizations:   . Attends Archivist Meetings:   Marland Kitchen Marital Status:   Intimate Partner Violence:   . Fear of Current or Ex-Partner:   . Emotionally Abused:   .  Physically Abused:   . Sexually Abused:      PHYSICAL EXAM:  VS: BP (!) 144/77   Pulse 92   Ht 6' (1.829 m)   Wt 216 lb (98 kg)   BMI 29.29 kg/m  Physical Exam Gen: NAD, alert, cooperative with exam, well-appearing MSK:  Right hand: Mild swelling of the proximal phalanx of the middle finger. No redness. Mild limitations in flexion. Right foot/ankle: Mild swelling in the tarsal tunnel. No tenderness to palpation at the navicular of the insertion of the posterior tibialis. No changes of the Achilles. Plantarflexion with [place] Silos test. Normal strength resistance. Normal range of motion. Neurovascularly intact     ASSESSMENT & PLAN:   Gout Has had improvement with allopurinol and has only mild swelling in limitation flexion. -Check uric acid.  May need to increase the dosage.  May need to add prednisone if increasing the doses. -May need to consider injection if no improvement.  Posterior tibial tendinitis of right lower extremity He has swelling over the tarsal tunnel and  tenderness.  Had an injury with swelling.  No weakness and no ecchymosis.  May have damaged the retinaculum in that area.  Has been gotten better. -Heel lifts. -Counseled on compression. -Counseled home exercise therapy and supportive care. -Could consider boot or physical therapy or imaging if needed.

## 2019-07-06 ENCOUNTER — Telehealth: Payer: Self-pay | Admitting: Family Medicine

## 2019-07-06 LAB — URIC ACID: Uric Acid: 5.6 mg/dL (ref 3.8–8.4)

## 2019-07-06 NOTE — Telephone Encounter (Signed)
Left VM for patient. If he calls back please have him speak with a nurse/CMA and inform that his uric acid is at a normal level. We will continue the current dose of allopurinol. If his finger is still feeling swollen then we may need to try another course of prednisone.   If any questions then please take the best time and phone number to call and I will try to call him back.   Rosemarie Ax, MD [facility] 07/06/2019, 10:07 AM

## 2019-07-10 ENCOUNTER — Telehealth: Payer: Self-pay | Admitting: Family Medicine

## 2019-07-10 NOTE — Telephone Encounter (Signed)
Spoke to patient and gave result information as provided by the physician. 

## 2019-07-10 NOTE — Telephone Encounter (Signed)
Patient returning call for results 

## 2019-07-24 ENCOUNTER — Other Ambulatory Visit: Payer: Self-pay | Admitting: *Deleted

## 2019-07-24 DIAGNOSIS — I10 Essential (primary) hypertension: Secondary | ICD-10-CM

## 2019-07-24 MED ORDER — AMLODIPINE BESYLATE 10 MG PO TABS: 10.0000 mg | ORAL_TABLET | Freq: Every day | ORAL | 3 refills | Status: DC

## 2019-08-30 ENCOUNTER — Ambulatory Visit: Payer: Medicare Other | Admitting: Family Medicine

## 2019-09-06 ENCOUNTER — Ambulatory Visit: Payer: Medicare Other | Admitting: Family Medicine

## 2019-09-06 ENCOUNTER — Encounter: Payer: Self-pay | Admitting: Family Medicine

## 2019-09-06 ENCOUNTER — Other Ambulatory Visit: Payer: Self-pay

## 2019-09-06 VITALS — BP 135/82 | HR 76 | Ht 72.0 in | Wt 215.0 lb

## 2019-09-06 DIAGNOSIS — M24111 Other articular cartilage disorders, right shoulder: Secondary | ICD-10-CM | POA: Diagnosis not present

## 2019-09-06 DIAGNOSIS — M10041 Idiopathic gout, right hand: Secondary | ICD-10-CM

## 2019-09-06 DIAGNOSIS — G8929 Other chronic pain: Secondary | ICD-10-CM | POA: Diagnosis not present

## 2019-09-06 DIAGNOSIS — M545 Low back pain: Secondary | ICD-10-CM

## 2019-09-06 MED ORDER — PREDNISONE 5 MG PO TABS: ORAL_TABLET | ORAL | 0 refills | Status: DC

## 2019-09-06 MED ORDER — HYDROCODONE-ACETAMINOPHEN 5-325 MG PO TABS: 1.0000 | ORAL_TABLET | Freq: Three times a day (TID) | ORAL | 0 refills | Status: DC | PRN

## 2019-09-06 NOTE — Patient Instructions (Signed)
Good to see you Please try the exercises  Please try the prednisone  Please use the norco for severe pain and as needed   Please send me a message in MyChart with any questions or updates.  Please see me back in 2-3 months.   --Dr. Raeford Razor

## 2019-09-06 NOTE — Progress Notes (Signed)
Jason Valencia - 69 y.o. male MRN [id number]  Date of birth: 06-01-1950  SUBJECTIVE:  Including CC & ROS.  Chief Complaint  Patient presents with  . Follow-up    right hand    Jason Valencia is a 69 y.o. male that is presenting with worsening of his right middle finger and right shoulder.  These again problems a have been ongoing.  Seem to be worse today.  He has been taking his allopurinol every other day.  Shoulder seems to be worse when he lies on the affected side.  Does get improvement through the course of the day.  It seems to be localized to the shoulder.  His right middle finger seems to be localization of the pain.   Review of Systems See HPI   HISTORY: Past Medical, Surgical, Social, and Family History Reviewed & Updated per EMR.   Pertinent Historical Findings include:  Past Medical History:  Diagnosis Date  . Cancer [facility])    prostate cancer- 2011  . Hypertension     Past Surgical History:  Procedure Laterality Date  . COLONOSCOPY    . KNEE SURGERY    . PROSTATE SURGERY    . ROTATOR CUFF REPAIR     left    Family History  Problem Relation Age of Onset  . Cancer Mother        ovarian  . Heart disease Father   . Cancer Sister        throat   . Colon cancer Neg Hx   . Esophageal cancer Neg Hx   . Rectal cancer Neg Hx   . Stomach cancer Neg Hx     Social History   Socioeconomic History  . Marital status: Married    Spouse name: Not on file  . Number of children: 2  . Years of education: Not on file  . Highest education level: Not on file  Occupational History  . Not on file  Tobacco Use  . Smoking status: Never Smoker  . Smokeless tobacco: Never Used  Substance and Sexual Activity  . Alcohol use: Yes    Alcohol/week: 2.0 standard drinks    Types: 2 Standard drinks or equivalent per week  . Drug use: No  . Sexual activity: Yes  Other Topics Concern  . Not on file  Social History Narrative  . Not on file   Social Determinants of Health    Financial Resource Strain:   . Difficulty of Paying Living Expenses:   Food Insecurity:   . Worried About Charity fundraiser in the Last Year:   . Arboriculturist in the Last Year:   Transportation Needs:   . Film/video editor (Medical):   Marland Kitchen Lack of Transportation (Non-Medical):   Physical Activity:   . Days of Exercise per Week:   . Minutes of Exercise per Session:   Stress:   . Feeling of Stress :   Social Connections:   . Frequency of Communication with Friends and Family:   . Frequency of Social Gatherings with Friends and Family:   . Attends Religious Services:   . Active Member of Clubs or Organizations:   . Attends Archivist Meetings:   Marland Kitchen Marital Status:   Intimate Partner Violence:   . Fear of Current or Ex-Partner:   . Emotionally Abused:   Marland Kitchen Physically Abused:   . Sexually Abused:      PHYSICAL EXAM:  VS: BP 135/82   Pulse 76  Ht 6' (1.829 m)   Wt 215 lb (97.5 kg)   BMI 29.16 kg/m  Physical Exam Gen: NAD, alert, cooperative with exam, well-appearing MSK:  Right hand: Symptoms palpation over the middle finger MCP joint. Neuro normal range of motion. No redness or warmth. Right shoulder. Normal active flexion abduction. Normal internal and external rotation. Some pain with external rotation abduction. Neurovascular intact     ASSESSMENT & PLAN:   Chronic midline low back pain without sciatica Acute on chronic in nature.  Has different changes observed on MRI that exacerbate from time to time. -Counseled on home exercise therapy and supportive care. -Norco. -Could consider facet injections.  Gout Still having ongoing pain of the third MCP joint.  Has responded with steroids previously. -Prednisone. -Most recent uric acid was within good range.  We will continue allopurinol. -If pain is ongoing can consider injection.  Labral tear of shoulder, degenerative, right Has pain with lying on the affected side.  Seems deep within  the joint.  No prior history of surgery. -Counseled on home exercise therapy and supportive care. -Could consider injection physical therapy.

## 2019-09-06 NOTE — Assessment & Plan Note (Signed)
Has pain with lying on the affected side.  Seems deep within the joint.  No prior history of surgery. -Counseled on home exercise therapy and supportive care. -Could consider injection physical therapy.

## 2019-09-06 NOTE — Assessment & Plan Note (Signed)
Still having ongoing pain of the third MCP joint.  Has responded with steroids previously. -Prednisone. -Most recent uric acid was within good range.  We will continue allopurinol. -If pain is ongoing can consider injection.

## 2019-09-06 NOTE — Assessment & Plan Note (Signed)
Acute on chronic in nature.  Has different changes observed on MRI that exacerbate from time to time. -Counseled on home exercise therapy and supportive care. -Norco. -Could consider facet injections.

## 2019-09-19 ENCOUNTER — Other Ambulatory Visit: Payer: Self-pay | Admitting: Emergency Medicine

## 2019-09-19 DIAGNOSIS — I10 Essential (primary) hypertension: Secondary | ICD-10-CM

## 2019-10-02 ENCOUNTER — Other Ambulatory Visit: Payer: Self-pay | Admitting: Emergency Medicine

## 2019-10-02 DIAGNOSIS — I10 Essential (primary) hypertension: Secondary | ICD-10-CM

## 2019-10-04 ENCOUNTER — Telehealth: Payer: Self-pay | Admitting: Family Medicine

## 2019-10-04 NOTE — Telephone Encounter (Signed)
Patient is calling and requesting a refill for lisinopril sent to [facility] on [address]. CB is [phone]

## 2019-10-05 ENCOUNTER — Other Ambulatory Visit: Payer: Self-pay

## 2019-10-05 DIAGNOSIS — I10 Essential (primary) hypertension: Secondary | ICD-10-CM

## 2019-10-05 MED ORDER — LISINOPRIL-HYDROCHLOROTHIAZIDE 20-25 MG PO TABS: 1.0000 | ORAL_TABLET | Freq: Every day | ORAL | 0 refills | Status: DC

## 2020-01-01 ENCOUNTER — Other Ambulatory Visit: Payer: Self-pay | Admitting: Family Medicine

## 2020-01-01 DIAGNOSIS — I10 Essential (primary) hypertension: Secondary | ICD-10-CM

## 2020-01-04 ENCOUNTER — Telehealth: Payer: Self-pay | Admitting: Family Medicine

## 2020-01-04 NOTE — Telephone Encounter (Signed)
Patient has an appointment in January but Dr. Ethelene Hal would like to see who now and in jan.

## 2020-01-04 NOTE — Telephone Encounter (Signed)
LVM to call back to schedule f/u appointment for medication refill.

## 2020-04-03 ENCOUNTER — Telehealth: Payer: Self-pay | Admitting: Family Medicine

## 2020-04-03 NOTE — Telephone Encounter (Signed)
Left message for patient to schedule Annual Wellness Visit.  Please schedule with Nurse Health Advisor Martha Stanley, RN at [facility] [place] [facility]  °

## 2020-04-05 ENCOUNTER — Encounter: Payer: Medicare Other | Admitting: Family Medicine

## 2020-04-30 ENCOUNTER — Other Ambulatory Visit: Payer: Self-pay | Admitting: Family Medicine

## 2020-04-30 DIAGNOSIS — I10 Essential (primary) hypertension: Secondary | ICD-10-CM

## 2020-05-01 ENCOUNTER — Telehealth: Payer: Self-pay | Admitting: Family Medicine

## 2020-05-01 NOTE — Telephone Encounter (Signed)
Pt asked if a referral could be sent to [facility]. Call back is [phone]

## 2020-05-02 NOTE — Telephone Encounter (Signed)
Patient has upcoming appointment scheduled

## 2020-05-03 NOTE — Telephone Encounter (Signed)
Patient is requesting a call back to discuss this referral. He states that he may not be able to keep upcoming appointment due to death in family and the funeral may be same day as appointment. He said that he needs this referral sent as soon as possible because he really needs to to see the Cardiologist. Please call him at [phone].

## 2020-05-03 NOTE — Telephone Encounter (Signed)
Spoke with very angry patient who states that he was told from Cardiology that he did not need to come in that we should just send over the referral. Explained to patient that he have not been sen in the office in over 1 year and he would need to come in to be evaluated so that the Provider would know what the referral was being sent for. Patient still not wanting to hear me and states that he may not be able to come to the appointment on 05/07/20 offered another appointment but patient declined.

## 2020-05-06 ENCOUNTER — Telehealth: Payer: Self-pay | Admitting: Family Medicine

## 2020-05-06 ENCOUNTER — Ambulatory Visit (HOSPITAL_BASED_OUTPATIENT_CLINIC_OR_DEPARTMENT_OTHER)
Admission: RE | Admit: 2020-05-06 | Discharge: 2020-05-06 | Disposition: A | Discharge status: Home / Self Care | Payer: Medicare Other | Source: Ambulatory Visit | Attending: Family Medicine | Admitting: Family Medicine

## 2020-05-06 ENCOUNTER — Other Ambulatory Visit: Payer: Self-pay

## 2020-05-06 ENCOUNTER — Ambulatory Visit: Payer: Medicare Other | Admitting: Family Medicine

## 2020-05-06 VITALS — BP 136/88 | Ht 72.0 in | Wt 215.0 lb

## 2020-05-06 DIAGNOSIS — R079 Chest pain, unspecified: Secondary | ICD-10-CM | POA: Insufficient documentation

## 2020-05-06 DIAGNOSIS — I10 Essential (primary) hypertension: Secondary | ICD-10-CM

## 2020-05-06 DIAGNOSIS — Z8546 Personal history of malignant neoplasm of prostate: Secondary | ICD-10-CM | POA: Diagnosis not present

## 2020-05-06 DIAGNOSIS — M76821 Posterior tibial tendinitis, right leg: Secondary | ICD-10-CM

## 2020-05-06 DIAGNOSIS — G8929 Other chronic pain: Secondary | ICD-10-CM

## 2020-05-06 MED ORDER — HYDROCODONE-ACETAMINOPHEN 5-325 MG PO TABS: 1.0000 | ORAL_TABLET | Freq: Three times a day (TID) | ORAL | 0 refills | Status: DC | PRN

## 2020-05-06 NOTE — Assessment & Plan Note (Addendum)
Intermittent left-sided chest pain and headaches.  No diaphoresis or nausea.  EKG was reassuring today. -Counseled on seeking immediate care. -Chest x-ray. -Referral to cardiology

## 2020-05-06 NOTE — Telephone Encounter (Signed)
Refilled norco for low back pain.   Rosemarie Ax, MD [facility] 05/06/2020, 12:41 PM

## 2020-05-06 NOTE — Patient Instructions (Signed)
Good to see you Please call to make the appointment with cardiology   Please send me a message in [place] with any questions or updates.  Please see me back in 4 weeks.   --Dr. Raeford Razor

## 2020-05-06 NOTE — Assessment & Plan Note (Signed)
Pain is controlled with heel lifts  - heel lifts

## 2020-05-06 NOTE — Assessment & Plan Note (Addendum)
Controlled today.  Has been fluctuant as of late. -CBC, CMP, lipid panel -Continue amlodipine and lisinopril and hydrochlorothiazide

## 2020-05-06 NOTE — Progress Notes (Signed)
  Jason Valencia - 70 y.o. male MRN [id number]  Date of birth: 19-Jun-1950  SUBJECTIVE:  Including CC & ROS.  No chief complaint on file.   Jason Valencia is a 70 y.o. male that is presenting with left-sided chest pain and left shoulder pain.  Symptoms been ongoing for the past few weeks.  No diaphoresis or nausea.  No history of similar occurrences.  No associated with exercise.  His blood pressure has been fluctuant as of late.  He has been compliant with medications.  He has a history of prostate cancer so had his PSA checked in some time.   Review of Systems See HPI   HISTORY: Past Medical, Surgical, Social, and Family History Reviewed & Updated per EMR.   Pertinent Historical Findings include:  Past Medical History:  Diagnosis Date  . Cancer [facility])    prostate cancer- 2011  . Hypertension     Past Surgical History:  Procedure Laterality Date  . COLONOSCOPY    . KNEE SURGERY    . PROSTATE SURGERY    . ROTATOR CUFF REPAIR     left    Family History  Problem Relation Age of Onset  . Cancer Mother        ovarian  . Heart disease Father   . Cancer Sister        throat   . Colon cancer Neg Hx   . Esophageal cancer Neg Hx   . Rectal cancer Neg Hx   . Stomach cancer Neg Hx     Social History   Socioeconomic History  . Marital status: Married    Spouse name: Not on file  . Number of children: 2  . Years of education: Not on file  . Highest education level: Not on file  Occupational History  . Not on file  Tobacco Use  . Smoking status: Never Smoker  . Smokeless tobacco: Never Used  Vaping Use  . Vaping Use: Never used  Substance and Sexual Activity  . Alcohol use: Yes    Alcohol/week: 2.0 standard drinks    Types: 2 Standard drinks or equivalent per week  . Drug use: No  . Sexual activity: Yes  Other Topics Concern  . Not on file  Social History Narrative  . Not on file   Social Determinants of Health   Financial Resource Strain: Not on file  Food  Insecurity: Not on file  Transportation Needs: Not on file  Physical Activity: Not on file  Stress: Not on file  Social Connections: Not on file  Intimate Partner Violence: Not on file     PHYSICAL EXAM:  VS: BP 136/88 (BP Location: Left Arm, Patient Position: Sitting, Cuff Size: Large)   Ht 6' (1.829 m)   Wt 215 lb (97.5 kg)   BMI 29.16 kg/m  Physical Exam Gen: NAD, alert, cooperative with exam, well-appearing      ASSESSMENT & PLAN:   Essential hypertension Controlled today.  Has been fluctuant as of late. -CBC, CMP, lipid panel -Continue amlodipine and lisinopril and hydrochlorothiazide  History of prostate cancer PSA has not been checked in some time.  Asymptomatic today. -PSA  Left-sided chest pain Intermittent left-sided chest pain and headaches.  No diaphoresis or nausea.  EKG was reassuring today. -Counseled on seeking immediate care. -Chest x-ray. -Referral to cardiology   Posterior tibial tendinitis of right lower extremity Pain is controlled with heel lifts  - heel lifts

## 2020-05-06 NOTE — Assessment & Plan Note (Signed)
PSA has not been checked in some time.  Asymptomatic today. -PSA

## 2020-05-06 NOTE — Telephone Encounter (Signed)
Patient states forgot to ask provider for refill of :   HYDROcodone-acetaminophen (NORCO/VICODIN) 5-325 MG tablet [[id number]]   Order Details Dose: 1 tablet Route: Oral Frequency: Every 8 hours PRN for moderate pain  Dispense Quantity: 15 tablet Refills: 0   Indications of Use: Moderate to Moderately Severe Pain      Sig: Take 1 tablet by mouth every 8 (eight) hours as needed for moderate pain.   ---Forwarding message to med asst & provider that if approved send refill order to :  [place], [address] AT [facility] [address]  [place], [place] [address]  Phone:  [phone] Fax:  [phone]    --glh

## 2020-05-07 ENCOUNTER — Encounter: Payer: Medicare Other | Admitting: Family Medicine

## 2020-05-07 ENCOUNTER — Telehealth: Payer: Self-pay | Admitting: Family Medicine

## 2020-05-07 NOTE — Telephone Encounter (Signed)
Pt informed of below and verbalized understanding.

## 2020-05-07 NOTE — Telephone Encounter (Signed)
Patient called to cancel appointment for today.

## 2020-05-07 NOTE — Telephone Encounter (Signed)
Left VM for patient. If he calls back please have him speak with a nurse/CMA and inform that his chest xray shows atherosclerosis but no acute changes. The cardiologist will be able to address the atherosclerosis .   If any questions then please take the best time and phone number to call and I will try to call him back.   Rosemarie Ax, MD [facility] 05/07/2020, 9:15 AM

## 2020-05-16 ENCOUNTER — Telehealth: Payer: Self-pay | Admitting: Family Medicine

## 2020-05-16 LAB — LIPID PANEL
Chol/HDL Ratio: 2.5 ratio (ref 0.0–5.0)
Cholesterol, Total: 167 mg/dL (ref 100–199)
HDL: 66 mg/dL (ref >39)
LDL Chol Calc (NIH): 83 mg/dL (ref 0–99)
Triglycerides: 101 mg/dL (ref 0–149)
VLDL Cholesterol Cal: 18 mg/dL (ref 5–40)

## 2020-05-16 LAB — COMPREHENSIVE METABOLIC PANEL
ALT: 16 IU/L (ref 0–44)
AST: 15 IU/L (ref 0–40)
Albumin/Globulin Ratio: 1.6 (ref 1.2–2.2)
Albumin: 4.4 g/dL (ref 3.8–4.8)
Alkaline Phosphatase: 72 IU/L (ref 44–121)
BUN/Creatinine Ratio: 15 (ref 10–24)
BUN: 14 mg/dL (ref 8–27)
Bilirubin Total: 0.4 mg/dL (ref 0.0–1.2)
CO2: 25 mmol/L (ref 20–29)
Calcium: 9.9 mg/dL (ref 8.6–10.2)
Chloride: 98 mmol/L (ref 96–106)
Creatinine, Ser: 0.96 mg/dL (ref 0.76–1.27)
GFR calc Af Amer: 94 mL/min/{1.73_m2} (ref >59)
GFR calc non Af Amer: 81 mL/min/{1.73_m2} (ref >59)
Globulin, Total: 2.7 g/dL (ref 1.5–4.5)
Glucose: 103 mg/dL — ABNORMAL HIGH (ref 65–99)
Potassium: 4 mmol/L (ref 3.5–5.2)
Sodium: 138 mmol/L (ref 134–144)
Total Protein: 7.1 g/dL (ref 6.0–8.5)

## 2020-05-16 LAB — CBC
Hematocrit: 47 % (ref 37.5–51.0)
Hemoglobin: 15.8 g/dL (ref 13.0–17.7)
MCH: 27.9 pg (ref 26.6–33.0)
MCHC: 33.6 g/dL (ref 31.5–35.7)
MCV: 83 fL (ref 79–97)
Platelets: 269 10*3/uL (ref 150–450)
RBC: 5.67 x10E6/uL (ref 4.14–5.80)
RDW: 12.9 % (ref 11.6–15.4)
WBC: 6.4 10*3/uL (ref 3.4–10.8)

## 2020-05-16 LAB — PSA: Prostate Specific Ag, Serum: <0.1 ng/mL (ref 0.0–4.0)

## 2020-05-16 NOTE — Telephone Encounter (Signed)
Unable to leave VM for patient. If he calls back please have him speak with a nurse/CMA and inform that his lab work looks good. No changes need to be made.   If any questions then please take the best time and phone number to call and I will try to call him back.   Rosemarie Ax, MD [facility] 05/16/2020, 8:12 AM

## 2020-06-04 ENCOUNTER — Ambulatory Visit: Payer: Medicare Other | Admitting: Family Medicine

## 2020-06-11 ENCOUNTER — Encounter: Payer: Self-pay | Admitting: Family Medicine

## 2020-06-11 ENCOUNTER — Ambulatory Visit: Payer: Medicare Other | Admitting: Family Medicine

## 2020-06-11 ENCOUNTER — Other Ambulatory Visit: Payer: Self-pay

## 2020-06-11 ENCOUNTER — Ambulatory Visit (HOSPITAL_BASED_OUTPATIENT_CLINIC_OR_DEPARTMENT_OTHER)
Admission: RE | Admit: 2020-06-11 | Discharge: 2020-06-11 | Disposition: A | Discharge status: Home / Self Care | Payer: Medicare Other | Source: Ambulatory Visit | Attending: Family Medicine | Admitting: Family Medicine

## 2020-06-11 VITALS — BP 140/86 | Ht 72.0 in | Wt 215.0 lb

## 2020-06-11 DIAGNOSIS — M7502 Adhesive capsulitis of left shoulder: Secondary | ICD-10-CM

## 2020-06-11 DIAGNOSIS — M5416 Radiculopathy, lumbar region: Secondary | ICD-10-CM | POA: Diagnosis not present

## 2020-06-11 MED ORDER — CYCLOBENZAPRINE HCL 10 MG PO TABS: 10.0000 mg | ORAL_TABLET | Freq: Every day | ORAL | 1 refills | Status: DC

## 2020-06-11 NOTE — Progress Notes (Signed)
Jason Valencia - 70 y.o. male MRN [id number]  Date of birth: 13-Dec-1950  SUBJECTIVE:  Including CC & ROS.  No chief complaint on file.   Jason Valencia is a 70 y.o. male that is presenting with worsening of his left shoulder pain presenting with bilateral upper thigh pain.  The shoulder has acutely gotten worse.  Has been ongoing for a few months.  Has trouble with certain range of motion movements.  He has pain in the left and right lateral thigh when he goes down to sleep at night.  This pain goes away when he becomes more active.   Review of Systems See HPI   HISTORY: Past Medical, Surgical, Social, and Family History Reviewed & Updated per EMR.   Pertinent Historical Findings include:  Past Medical History:  Diagnosis Date  . Cancer [facility])    prostate cancer- 2011  . Hypertension     Past Surgical History:  Procedure Laterality Date  . COLONOSCOPY    . KNEE SURGERY    . PROSTATE SURGERY    . ROTATOR CUFF REPAIR     left    Family History  Problem Relation Age of Onset  . Cancer Mother        ovarian  . Heart disease Father   . Cancer Sister        throat   . Colon cancer Neg Hx   . Esophageal cancer Neg Hx   . Rectal cancer Neg Hx   . Stomach cancer Neg Hx     Social History   Socioeconomic History  . Marital status: Married    Spouse name: Not on file  . Number of children: 2  . Years of education: Not on file  . Highest education level: Not on file  Occupational History  . Not on file  Tobacco Use  . Smoking status: Never Smoker  . Smokeless tobacco: Never Used  Vaping Use  . Vaping Use: Never used  Substance and Sexual Activity  . Alcohol use: Yes    Alcohol/week: 2.0 standard drinks    Types: 2 Standard drinks or equivalent per week  . Drug use: No  . Sexual activity: Yes  Other Topics Concern  . Not on file  Social History Narrative  . Not on file   Social Determinants of Health   Financial Resource Strain: Not on file  Food  Insecurity: Not on file  Transportation Needs: Not on file  Physical Activity: Not on file  Stress: Not on file  Social Connections: Not on file  Intimate Partner Violence: Not on file     PHYSICAL EXAM:  VS: BP 140/86 (BP Location: Left Arm, Patient Position: Sitting, Cuff Size: Large)   Ht 6' (1.829 m)   Wt 215 lb (97.5 kg)   BMI 29.16 kg/m  Physical Exam Gen: NAD, alert, cooperative with exam, well-appearing MSK:  Left shoulder: Limited external rotation. Limited abduction and flexion. Normal strength resistance. Neurovascularly intact     ASSESSMENT & PLAN:   Adhesive capsulitis of left shoulder Symptoms seem most consistent for capsulitis.  He has range of motion is affected.  May have underlying degenerative changes. -Counseled on home exercise therapy and supportive care. -X-ray. -May consider injection physical therapy.  Lumbar radiculopathy His symptoms at night seem more radicular in nature.  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  No pain through the course of the day. -Counseled on home exercise therapy and supportive care. -Flexeril. -Could consider  gabapentin or epidural.

## 2020-06-11 NOTE — Assessment & Plan Note (Addendum)
His symptoms at night seem more radicular in nature.  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  No pain through the course of the day. -Counseled on home exercise therapy and supportive care. -Flexeril. -Could consider gabapentin or epidural.

## 2020-06-11 NOTE — Patient Instructions (Signed)
Good to see you Please try heat before exercises and ice after  Please try the flexeril at night  I will call with the results from today   Please send me a message in MyChart with any questions or updates.  Please see me back in 4 weeks.   --Dr. Raeford Razor

## 2020-06-11 NOTE — Assessment & Plan Note (Signed)
Symptoms seem most consistent for capsulitis.  He has range of motion is affected.  May have underlying degenerative changes. -Counseled on home exercise therapy and supportive care. -X-ray. -May consider injection physical therapy.

## 2020-06-12 ENCOUNTER — Telehealth: Payer: Self-pay | Admitting: Family Medicine

## 2020-06-12 NOTE — Telephone Encounter (Signed)
Left VM for patient. If he calls back please have him speak with a nurse/CMA and inform that his xrays are normal.   If any questions then please take the best time and phone number to call and I will try to call him back.   Rosemarie Ax, MD [facility] 06/12/2020, 9:23 AM

## 2020-06-19 ENCOUNTER — Encounter: Payer: Medicare Other | Admitting: Family Medicine

## 2020-07-09 ENCOUNTER — Ambulatory Visit: Payer: Medicare Other | Admitting: Family Medicine

## 2020-07-11 ENCOUNTER — Other Ambulatory Visit: Payer: Self-pay | Admitting: Family Medicine

## 2020-07-11 DIAGNOSIS — M545 Low back pain, unspecified: Secondary | ICD-10-CM

## 2020-07-11 DIAGNOSIS — G8929 Other chronic pain: Secondary | ICD-10-CM

## 2020-07-15 ENCOUNTER — Other Ambulatory Visit: Payer: Self-pay | Admitting: Emergency Medicine

## 2020-07-15 DIAGNOSIS — I10 Essential (primary) hypertension: Secondary | ICD-10-CM

## 2020-07-18 ENCOUNTER — Other Ambulatory Visit: Payer: Self-pay

## 2020-07-18 ENCOUNTER — Ambulatory Visit: Payer: Medicare Other | Admitting: Family Medicine

## 2020-07-18 ENCOUNTER — Encounter: Payer: Self-pay | Admitting: Family Medicine

## 2020-07-18 DIAGNOSIS — M545 Low back pain, unspecified: Secondary | ICD-10-CM | POA: Diagnosis not present

## 2020-07-18 DIAGNOSIS — G8929 Other chronic pain: Secondary | ICD-10-CM

## 2020-07-18 NOTE — Progress Notes (Signed)
  Jason Valencia - 70 y.o. male MRN [id number]  Date of birth: April 04, 1950  SUBJECTIVE:  Including CC & ROS.  No chief complaint on file.   Jason Valencia is a 70 y.o. male that is presenting with acute on chronic low back pain.  His pain intermittently hurts.  Seems more localized to the lower back.   Review of Systems See HPI   HISTORY: Past Medical, Surgical, Social, and Family History Reviewed & Updated per EMR.   Pertinent Historical Findings include:  Past Medical History:  Diagnosis Date  . Cancer [facility])    prostate cancer- 2011  . Hypertension     Past Surgical History:  Procedure Laterality Date  . COLONOSCOPY    . KNEE SURGERY    . PROSTATE SURGERY    . ROTATOR CUFF REPAIR     left    Family History  Problem Relation Age of Onset  . Cancer Mother        ovarian  . Heart disease Father   . Cancer Sister        throat   . Colon cancer Neg Hx   . Esophageal cancer Neg Hx   . Rectal cancer Neg Hx   . Stomach cancer Neg Hx     Social History   Socioeconomic History  . Marital status: Married    Spouse name: Not on file  . Number of children: 2  . Years of education: Not on file  . Highest education level: Not on file  Occupational History  . Not on file  Tobacco Use  . Smoking status: Never Smoker  . Smokeless tobacco: Never Used  Vaping Use  . Vaping Use: Never used  Substance and Sexual Activity  . Alcohol use: Yes    Alcohol/week: 2.0 standard drinks    Types: 2 Standard drinks or equivalent per week  . Drug use: No  . Sexual activity: Yes  Other Topics Concern  . Not on file  Social History Narrative  . Not on file   Social Determinants of Health   Financial Resource Strain: Not on file  Food Insecurity: Not on file  Transportation Needs: Not on file  Physical Activity: Not on file  Stress: Not on file  Social Connections: Not on file  Intimate Partner Violence: Not on file     PHYSICAL EXAM:  VS: BP 130/88 (BP Location: Left  Arm, Patient Position: Sitting, Cuff Size: Large)   Ht 6' (1.829 m)   Wt 215 lb (97.5 kg)   BMI 29.16 kg/m  Physical Exam Gen: NAD, alert, cooperative with exam, well-appearing   ASSESSMENT & PLAN:   Chronic midline low back pain without sciatica Intermittent in nature.  Has pain when he does things out of character. -Counseled on home exercise therapy and supportive care. -Could consider physical therapy or injection

## 2020-07-18 NOTE — Assessment & Plan Note (Signed)
Intermittent in nature.  Has pain when he does things out of character. -Counseled on home exercise therapy and supportive care. -Could consider physical therapy or injection

## 2020-07-26 ENCOUNTER — Other Ambulatory Visit: Payer: Self-pay | Admitting: Family Medicine

## 2020-07-26 DIAGNOSIS — I10 Essential (primary) hypertension: Secondary | ICD-10-CM

## 2020-07-30 DIAGNOSIS — Z20822 Contact with and (suspected) exposure to covid-19: Secondary | ICD-10-CM | POA: Diagnosis not present

## 2020-09-12 NOTE — Telephone Encounter (Signed)
Scheduled for appt on 09/25/20.Dm/cma

## 2020-09-25 ENCOUNTER — Encounter: Payer: Self-pay | Admitting: Family Medicine

## 2020-09-25 ENCOUNTER — Other Ambulatory Visit: Payer: Self-pay

## 2020-09-25 ENCOUNTER — Telehealth: Payer: Self-pay

## 2020-09-25 ENCOUNTER — Ambulatory Visit (INDEPENDENT_AMBULATORY_CARE_PROVIDER_SITE_OTHER): Payer: Medicare Other | Admitting: Family Medicine

## 2020-09-25 VITALS — BP 140/78 | HR 88 | Temp 97.5°F | Ht 72.0 in | Wt 222.8 lb

## 2020-09-25 DIAGNOSIS — R32 Unspecified urinary incontinence: Secondary | ICD-10-CM | POA: Diagnosis not present

## 2020-09-25 DIAGNOSIS — Z0000 Encounter for general adult medical examination without abnormal findings: Secondary | ICD-10-CM

## 2020-09-25 DIAGNOSIS — Z8546 Personal history of malignant neoplasm of prostate: Secondary | ICD-10-CM

## 2020-09-25 DIAGNOSIS — Z8739 Personal history of other diseases of the musculoskeletal system and connective tissue: Secondary | ICD-10-CM | POA: Insufficient documentation

## 2020-09-25 DIAGNOSIS — R7309 Other abnormal glucose: Secondary | ICD-10-CM

## 2020-09-25 DIAGNOSIS — M545 Low back pain, unspecified: Secondary | ICD-10-CM

## 2020-09-25 DIAGNOSIS — M79662 Pain in left lower leg: Secondary | ICD-10-CM

## 2020-09-25 DIAGNOSIS — G8929 Other chronic pain: Secondary | ICD-10-CM

## 2020-09-25 LAB — URINALYSIS, ROUTINE W REFLEX MICROSCOPIC
Bilirubin Urine: NEGATIVE
Hgb urine dipstick: NEGATIVE
Ketones, ur: NEGATIVE
Leukocytes,Ua: NEGATIVE
Nitrite: NEGATIVE
RBC / HPF: NONE SEEN (ref 0–?)
Specific Gravity, Urine: 1.02 (ref 1.000–1.030)
Total Protein, Urine: NEGATIVE
Urine Glucose: NEGATIVE
Urobilinogen, UA: 0.2 (ref 0.0–1.0)
WBC, UA: NONE SEEN (ref 0–?)
pH: 6 (ref 5.0–8.0)

## 2020-09-25 LAB — URIC ACID: Uric Acid, Serum: 6.1 mg/dL (ref 4.0–7.8)

## 2020-09-25 LAB — HEMOGLOBIN A1C: Hgb A1c MFr Bld: 5.8 % (ref 4.6–6.5)

## 2020-09-25 MED ORDER — METHOCARBAMOL 750 MG PO TABS
750.0000 mg | ORAL_TABLET | Freq: Three times a day (TID) | ORAL | 1 refills | Status: DC | PRN
Start: 2020-09-25 — End: 2021-04-17

## 2020-09-25 NOTE — Progress Notes (Signed)
Established Patient Office Visit  Subjective:  Patient ID: Jason Valencia, male    DOB: 1950/06/15  Age: 69 y.o. MRN: [id number]  CC:  Chief Complaint  Patient presents with   Annual Exam    CPE, no concerns. Patient fasting for labs.     HPI Jason Valencia presents for physical examination and follow-up of chronic lower back pain, history of gout, history of prostatectomy for prostate cancer.  He had developed persistent urinary incontinence after his prostatectomy and says that about bladder sling surgery has been performed.  It worked until recently where he has become incontinent of urine.  He dribbles and wears a depends.  Recent PSA test was 0.  He did try to obtain a Shingrix vaccine but did not have his Medicare card at that time.  Continues to be active by walking.  He walked a few miles today for our visit.  His truck would not start.  Has occasional shooting pains in his left shin.  No specific injury.  Blood work obtained in April of this year was essentially normal.  Minimal elevation of glucose.  History of chronic back pain with spinal stenosis.  As needed Robaxin helps.  Past Medical History:  Diagnosis Date   Cancer [facility])    prostate cancer- 2011   Hypertension     Past Surgical History:  Procedure Laterality Date   COLONOSCOPY     KNEE SURGERY     PROSTATE SURGERY     ROTATOR CUFF REPAIR     left    Family History  Problem Relation Age of Onset   Cancer Mother        ovarian   Heart disease Father    Cancer Sister        throat    Colon cancer Neg Hx    Esophageal cancer Neg Hx    Rectal cancer Neg Hx    Stomach cancer Neg Hx     Social History   Socioeconomic History   Marital status: Married    Spouse name: Not on file   Number of children: 2   Years of education: Not on file   Highest education level: Not on file  Occupational History   Not on file  Tobacco Use   Smoking status: Never   Smokeless tobacco: Never  Vaping Use   Vaping  Use: Never used  Substance and Sexual Activity   Alcohol use: Yes    Alcohol/week: 2.0 standard drinks    Types: 2 Standard drinks or equivalent per week   Drug use: No   Sexual activity: Yes  Other Topics Concern   Not on file  Social History Narrative   Not on file   Social Determinants of Health   Financial Resource Strain: Not on file  Food Insecurity: Not on file  Transportation Needs: Not on file  Physical Activity: Not on file  Stress: Not on file  Social Connections: Not on file  Intimate Partner Violence: Not on file    Outpatient Medications Prior to Visit  Medication Sig Dispense Refill   amLODipine (NORVASC) 10 MG tablet TAKE 1 TABLET(10 MG) BY MOUTH DAILY 90 tablet 3   ibuprofen (ADVIL) 800 MG tablet Take one every 8 hours with food as needed. 90 tablet 11   lisinopril-hydrochlorothiazide (ZESTORETIC) 20-25 MG tablet TAKE 1 TABLET BY MOUTH DAILY 90 tablet 0   sildenafil (REVATIO) 20 MG tablet May take one to three daily 45 minutes prior to intercourse. [place]  tablet 6   meloxicam (MOBIC) 15 MG tablet Take 1 tablet (15 mg total) by mouth daily. 15 tablet 0   methocarbamol (ROBAXIN) 750 MG tablet Take by mouth.     colchicine 0.6 MG tablet Take 1 tablet (0.6 mg total) by mouth 2 (two) times daily. (Patient not taking: Reported on 09/25/2020) 60 tablet 2   allopurinol (ZYLOPRIM) 100 MG tablet Take 1 tablet (100 mg total) by mouth daily. 30 tablet 3   cyclobenzaprine (FLEXERIL) 10 MG tablet Take 1 tablet (10 mg total) by mouth at bedtime. (Patient not taking: Reported on 09/25/2020) 30 tablet 1   HYDROcodone-acetaminophen (NORCO/VICODIN) 5-325 MG tablet Take 1 tablet by mouth every 8 (eight) hours as needed for moderate pain. 15 tablet 0   hydrOXYzine (ATARAX/VISTARIL) 50 MG tablet TAKE 1 TABLETS BY MOUTH EVERY NIGHT 30 MINUTES BEFORE BEDTIME AS NEEDED 60 tablet 0   predniSONE (DELTASONE) 5 MG tablet Take 6 pills for first day, 5 pills second day, 4 pills third day, 3 pills  fourth day, 2 pills the fifth day, and 1 pill sixth day. 21 tablet 0   No facility-administered medications prior to visit.    No Known Allergies  ROS Review of Systems  Constitutional: Negative.   HENT: Negative.    Eyes:  Negative for photophobia and visual disturbance.  Respiratory: Negative.    Cardiovascular: Negative.   Gastrointestinal: Negative.   Endocrine: Negative for polyphagia and polyuria.  Genitourinary:  Negative for decreased urine volume, difficulty urinating, dysuria, frequency and urgency.  Musculoskeletal:  Positive for back pain.  Skin: Negative.   Neurological:  Negative for speech difficulty and weakness.  Psychiatric/Behavioral: Negative.       Objective:    Physical Exam Vitals and nursing note reviewed.  Constitutional:      General: He is not in acute distress.    Appearance: Normal appearance. He is not ill-appearing, toxic-appearing or diaphoretic.  HENT:     Head: Normocephalic and atraumatic.     Right Ear: Tympanic membrane, ear canal and external ear normal.     Left Ear: Tympanic membrane, ear canal and external ear normal.     Mouth/Throat:     Mouth: Mucous membranes are moist.     Pharynx: Oropharynx is clear. No oropharyngeal exudate or posterior oropharyngeal erythema.  Eyes:     General: No scleral icterus.       Right eye: No discharge.        Left eye: No discharge.     Extraocular Movements: Extraocular movements intact.     Conjunctiva/sclera: Conjunctivae normal.     Pupils: Pupils are equal, round, and reactive to light.  Neck:     Vascular: No carotid bruit.  Cardiovascular:     Rate and Rhythm: Normal rate and regular rhythm.  Pulmonary:     Effort: Pulmonary effort is normal.     Breath sounds: Normal breath sounds.  Abdominal:     General: Abdomen is flat. Bowel sounds are normal. There is no distension.     Palpations: There is no mass.     Tenderness: There is no abdominal tenderness. There is no guarding or  rebound.     Hernia: No hernia is present.  Musculoskeletal:     Cervical back: No rigidity or tenderness.     Right lower leg: No edema.     Left lower leg: No deformity, lacerations, tenderness or bony tenderness. No edema.       Legs:  Lymphadenopathy:  Cervical: No cervical adenopathy.  Neurological:     Mental Status: He is alert and oriented to person, place, and time.  Psychiatric:        Mood and Affect: Mood normal.        Behavior: Behavior normal.    BP 140/78   Pulse 88   Temp (!) 97.5 F (36.4 C) (Temporal)   Ht 6' (1.829 m)   Wt 222 lb 12.8 oz (101.1 kg)   SpO2 97%   BMI 30.22 kg/m  Wt Readings from Last 3 Encounters:  09/25/20 222 lb 12.8 oz (101.1 kg)  07/18/20 215 lb (97.5 kg)  06/11/20 215 lb (97.5 kg)     Health Maintenance Due  Topic Date Due   Zoster Vaccines- Shingrix (1 of 2) Never done   PNA vac Low Risk Adult (2 of 2 - PCV13) 12/16/2019    There are no preventive care reminders to display for this patient.  Lab Results  Component Value Date   TSH 0.995 06/22/2016   Lab Results  Component Value Date   WBC 6.4 05/15/2020   HGB 15.8 05/15/2020   HCT 47.0 05/15/2020   MCV 83 05/15/2020   PLT 269 05/15/2020   Lab Results  Component Value Date   NA 138 05/15/2020   K 4.0 05/15/2020   CO2 25 05/15/2020   GLUCOSE 103 (H) 05/15/2020   BUN 14 05/15/2020   CREATININE 0.96 05/15/2020   BILITOT 0.4 05/15/2020   ALKPHOS 72 05/15/2020   AST 15 05/15/2020   ALT 16 05/15/2020   PROT 7.1 05/15/2020   ALBUMIN 4.4 05/15/2020   CALCIUM 9.9 05/15/2020   GFR 96.78 01/13/2019   Lab Results  Component Value Date   CHOL 167 05/15/2020   Lab Results  Component Value Date   HDL 66 05/15/2020   Lab Results  Component Value Date   LDLCALC 83 05/15/2020   Lab Results  Component Value Date   TRIG 101 05/15/2020   Lab Results  Component Value Date   CHOLHDL 2.5 05/15/2020   Lab Results  Component Value Date   HGBA1C 5.4 02/13/2013       Assessment & Plan:   Problem List Items Addressed This Visit       Other   History of prostate cancer (Chronic)   Relevant Orders   Ambulatory referral to Urology   Healthcare maintenance - Primary   Chronic midline low back pain without sciatica   Relevant Medications   methocarbamol (ROBAXIN) 750 MG tablet   Urinary incontinence   Relevant Orders   Urinalysis, Routine w reflex microscopic   Ambulatory referral to Urology   History of gout   Relevant Orders   Uric acid   Elevated glucose   Relevant Orders   Hemoglobin A1c   Other Visit Diagnoses     Pain in left shin           Meds ordered this encounter  Medications   methocarbamol (ROBAXIN) 750 MG tablet    Sig: Take 1 tablet (750 mg total) by mouth every 8 (eight) hours as needed for muscle spasms.    Dispense:  90 tablet    Refill:  1    Follow-up: Return in about 6 months (around 03/27/2021).   Information given on immunizations.  Advised him to try again for the Shingrix vaccine.  He will return if the pain in his left shin does not improve on its own. Libby Maw, MD

## 2020-09-25 NOTE — Telephone Encounter (Signed)
Pt was seen today and said that he and provider discussed sending in Ibuprofen to his pharmacy.  Pt said that it has not been sent in.  Please send in medication to pharmacy on file.  Thank you

## 2020-09-26 MED ORDER — IBUPROFEN 800 MG PO TABS: ORAL_TABLET | ORAL | 1 refills | Status: DC

## 2020-10-04 ENCOUNTER — Telehealth: Payer: Self-pay | Admitting: Family Medicine

## 2020-10-04 NOTE — Telephone Encounter (Signed)
[facility] is calling looking for a fax that they have sent over. If any further questions. Please call [facility] at [phone].

## 2020-10-04 NOTE — Telephone Encounter (Signed)
Fax received

## 2020-10-22 ENCOUNTER — Other Ambulatory Visit: Payer: Self-pay | Admitting: Family Medicine

## 2020-10-22 DIAGNOSIS — I10 Essential (primary) hypertension: Secondary | ICD-10-CM

## 2020-11-22 NOTE — Telephone Encounter (Signed)
Pt called back about this and said that [facility] did receive the fax but that they were missing some info that was needed to file. They said that they needed either on a letterhead(Only Dr Ethelene Hal could sign this) or prescription slip(They said either Dr Ethelene Hal or his assistant could sign this) candidate for the condom catheter. They said to fax to [phone]

## 2020-12-09 NOTE — Telephone Encounter (Signed)
Spoke with patient who states that he spoke with someone at [employer] who was supposed to fax over new forms to be filled out. Informed patient that we have not received new forms. Per patient he would call back with information on forms.

## 2020-12-21 ENCOUNTER — Telehealth: Payer: Self-pay

## 2020-12-21 NOTE — Telephone Encounter (Signed)
Pt declined AWV.  Dm/cma

## 2021-01-10 DIAGNOSIS — Z23 Encounter for immunization: Secondary | ICD-10-CM | POA: Diagnosis not present

## 2021-01-31 ENCOUNTER — Telehealth: Payer: Self-pay | Admitting: Family Medicine

## 2021-01-31 DIAGNOSIS — F5101 Primary insomnia: Secondary | ICD-10-CM

## 2021-01-31 NOTE — Telephone Encounter (Signed)
Pt called requesting hydrOXYzine.. said pharmacy has sent in request... but I don't see that.

## 2021-02-03 NOTE — Telephone Encounter (Signed)
Called patient to clarify requested Rx, no answer LMTCB

## 2021-02-03 NOTE — Telephone Encounter (Signed)
Pt called back checking on this Rx.

## 2021-02-04 NOTE — Telephone Encounter (Signed)
Patient have not had requested Rx filled in over one year. Called to clarify request no answer LMTCB

## 2021-02-05 NOTE — Telephone Encounter (Signed)
Patient calling for a refill on Hydroxyzine last OV 09/25/20 last refill March 2021 per patient he only takes medication prn and have needed it lately to help with sleep. Please advise.

## 2021-02-06 MED ORDER — HYDROXYZINE PAMOATE 25 MG PO CAPS: ORAL_CAPSULE | ORAL | 0 refills | Status: DC

## 2021-02-06 NOTE — Telephone Encounter (Signed)
Called patient to inform that Rx was sent in and to see if patient could schedule follow up appointment. No answer LMTCB to schedule appointment.

## 2021-02-06 NOTE — Addendum Note (Signed)
Addended by: Jon Billings on: 02/06/2021 07:39 AM   Modules accepted: Orders

## 2021-02-11 ENCOUNTER — Other Ambulatory Visit: Payer: Self-pay | Admitting: Family Medicine

## 2021-02-11 DIAGNOSIS — N5231 Erectile dysfunction following radical prostatectomy: Secondary | ICD-10-CM

## 2021-03-12 ENCOUNTER — Other Ambulatory Visit: Payer: Self-pay | Admitting: Family Medicine

## 2021-03-18 ENCOUNTER — Ambulatory Visit: Payer: Medicare Other | Admitting: Family Medicine

## 2021-03-18 ENCOUNTER — Encounter: Payer: Self-pay | Admitting: Family Medicine

## 2021-03-18 ENCOUNTER — Ambulatory Visit: Payer: Self-pay

## 2021-03-18 VITALS — BP 130/80 | Ht 72.0 in | Wt 215.0 lb

## 2021-03-18 DIAGNOSIS — M25472 Effusion, left ankle: Secondary | ICD-10-CM

## 2021-03-18 DIAGNOSIS — M25572 Pain in left ankle and joints of left foot: Secondary | ICD-10-CM

## 2021-03-18 MED ORDER — PREDNISONE 5 MG PO TABS: ORAL_TABLET | ORAL | 0 refills | Status: DC

## 2021-03-18 NOTE — Patient Instructions (Signed)
Good to see you Please try the medicine   Please send me a message in MyChart with any questions or updates.  Please see me back in 2-3 weeks if not better.   --Dr. Raeford Razor

## 2021-03-18 NOTE — Progress Notes (Signed)
°  Jason Valencia - 71 y.o. male MRN [id number]  Date of birth: Aug 02, 1950  SUBJECTIVE:  Including CC & ROS.  No chief complaint on file.   Jason Valencia is a 70 y.o. male that is presenting with left ankle pain.  The pain has been ongoing for the past few weeks.  No improvement with conservative measures.  It is occurring just to the medial side of the distal fibula.  Pain is intermittent in nature.  No injury.   Review of Systems See HPI   HISTORY: Past Medical, Surgical, Social, and Family History Reviewed & Updated per EMR.   Pertinent Historical Findings include:  Past Medical History:  Diagnosis Date   Cancer ([place])    prostate cancer- 2011   Hypertension     Past Surgical History:  Procedure Laterality Date   COLONOSCOPY     KNEE SURGERY     PROSTATE SURGERY     ROTATOR CUFF REPAIR     left    Family History  Problem Relation Age of Onset   Cancer Mother        ovarian   Heart disease Father    Cancer Sister        throat    Colon cancer Neg Hx    Esophageal cancer Neg Hx    Rectal cancer Neg Hx    Stomach cancer Neg Hx     Social History   Socioeconomic History   Marital status: Married    Spouse name: Not on file   Number of children: 2   Years of education: Not on file   Highest education level: Not on file  Occupational History   Not on file  Tobacco Use   Smoking status: Never   Smokeless tobacco: Never  Vaping Use   Vaping Use: Never used  Substance and Sexual Activity   Alcohol use: Yes    Alcohol/week: 2.0 standard drinks    Types: 2 Standard drinks or equivalent per week   Drug use: No   Sexual activity: Yes  Other Topics Concern   Not on file  Social History Narrative   Not on file   Social Determinants of Health   Financial Resource Strain: Not on file  Food Insecurity: Not on file  Transportation Needs: Not on file  Physical Activity: Not on file  Stress: Not on file  Social Connections: Not on file  Intimate Partner  Violence: Not on file     PHYSICAL EXAM:  VS: BP 130/80 (BP Location: Left Arm, Patient Position: Sitting)    Ht 6' (1.829 m)    Wt 215 lb (97.5 kg)    BMI 29.16 kg/m  Physical Exam Gen: NAD, alert, cooperative with exam, well-appearing   Limited ultrasound: Left ankle:  Normal-appearing anterior ankle joint. No changes of the distal fibula. Normal-appearing peroneal tendons. There is a mild effusion in the anterior lateral ankle joint with hyperechoic change to suggest gouty origin.  There is an overlying hyperemia  Summary: Synovitis and effusion of ankle.  Ultrasound and interpretation by Clearance Coots, MD     ASSESSMENT & PLAN:   Ankle effusion, left There appears to be an effusion with hyperechoic change to suggest a gouty origin.  - counseled on home exercise therapy and supportive care - prednisone  - could consider imaging or injection.

## 2021-03-18 NOTE — Assessment & Plan Note (Signed)
There appears to be an effusion with hyperechoic change to suggest a gouty origin.  - counseled on home exercise therapy and supportive care - prednisone  - could consider imaging or injection.

## 2021-04-08 ENCOUNTER — Ambulatory Visit: Payer: Medicare Other | Admitting: Family Medicine

## 2021-04-10 ENCOUNTER — Ambulatory Visit: Payer: Medicare Other | Admitting: Family Medicine

## 2021-04-17 ENCOUNTER — Ambulatory Visit (INDEPENDENT_AMBULATORY_CARE_PROVIDER_SITE_OTHER): Payer: Medicare Other | Admitting: Family Medicine

## 2021-04-17 ENCOUNTER — Encounter: Payer: Self-pay | Admitting: Family Medicine

## 2021-04-17 VITALS — BP 140/82 | Ht 72.0 in | Wt 210.0 lb

## 2021-04-17 DIAGNOSIS — M10072 Idiopathic gout, left ankle and foot: Secondary | ICD-10-CM

## 2021-04-17 DIAGNOSIS — M25472 Effusion, left ankle: Secondary | ICD-10-CM

## 2021-04-17 MED ORDER — COLCHICINE 0.6 MG PO TABS: 0.6000 mg | ORAL_TABLET | Freq: Two times a day (BID) | ORAL | 2 refills | Status: DC

## 2021-04-17 MED ORDER — CYCLOBENZAPRINE HCL 10 MG PO TABS: 10.0000 mg | ORAL_TABLET | Freq: Three times a day (TID) | ORAL | 1 refills | Status: DC | PRN

## 2021-04-17 NOTE — Assessment & Plan Note (Signed)
Most recent flare seems to be most consistent with gout. -Counseled on supportive care. -Check uric acid.

## 2021-04-17 NOTE — Patient Instructions (Signed)
Good to see you Please use ice as needed  You can take colchicine if you have any further foot pain  I will call with the results.   Please send me a message in MyChart with any questions or updates.  Please see me back in 4 weeks or as needed if better.   --Dr. Raeford Razor

## 2021-04-17 NOTE — Progress Notes (Signed)
°  Jason Valencia - 71 y.o. male MRN [id number]  Date of birth: [birth year]  SUBJECTIVE:  Including CC & ROS.  No chief complaint on file.   Jason Valencia is a 71 y.o. male that is following up for his left ankle pain.  He has been better with the prednisone.  Still has minor pain over the dorsum of the midfoot.    Review of Systems See HPI   HISTORY: Past Medical, Surgical, Social, and Family History Reviewed & Updated per EMR.   Pertinent Historical Findings include:  Past Medical History:  Diagnosis Date   Cancer ([place])    prostate cancer- 2011   Hypertension     Past Surgical History:  Procedure Laterality Date   COLONOSCOPY     KNEE SURGERY     PROSTATE SURGERY     ROTATOR CUFF REPAIR     left     PHYSICAL EXAM:  VS: BP 140/82 (BP Location: Left Arm, Patient Position: Sitting)    Ht 6' (1.829 m)    Wt 210 lb (95.3 kg)    BMI 28.48 kg/m  Physical Exam Gen: NAD, alert, cooperative with exam, well-appearing MSK:  Neurovascularly intact       ASSESSMENT & PLAN:   Ankle effusion, left Significant improvement with prednisone.  Only minor pain today. -Counseled on home exercise therapy and supportive care. -Refilled colchicine. -Flexeril as needed. -Could consider injection.  Gout Most recent flare seems to be most consistent with gout. -Counseled on supportive care. -Check uric acid.

## 2021-04-17 NOTE — Assessment & Plan Note (Signed)
Significant improvement with prednisone.  Only minor pain today. -Counseled on home exercise therapy and supportive care. -Refilled colchicine. -Flexeril as needed. -Could consider injection.

## 2021-04-18 LAB — URIC ACID: Uric Acid: 5.9 mg/dL (ref 3.8–8.4)

## 2021-04-21 ENCOUNTER — Telehealth: Payer: Self-pay | Admitting: Family Medicine

## 2021-04-21 NOTE — Telephone Encounter (Signed)
Informed of results.   Rosemarie Ax, MD [facility] 04/21/2021, 9:25 AM

## 2021-04-29 ENCOUNTER — Other Ambulatory Visit: Payer: Self-pay | Admitting: Family Medicine

## 2021-04-29 DIAGNOSIS — I10 Essential (primary) hypertension: Secondary | ICD-10-CM

## 2021-05-01 NOTE — Telephone Encounter (Signed)
Called patient to schedule appointment no answer LMTCB to schedule appointment.

## 2021-05-12 IMAGING — DX DG SHOULDER 2+V*L*
3 series · 3 of 3 positions shown · non-contrast
Comparison: None.

CLINICAL DATA: Anterior shoulder pain

EXAM:
LEFT SHOULDER - 2+ VIEW

[shoulder axial]
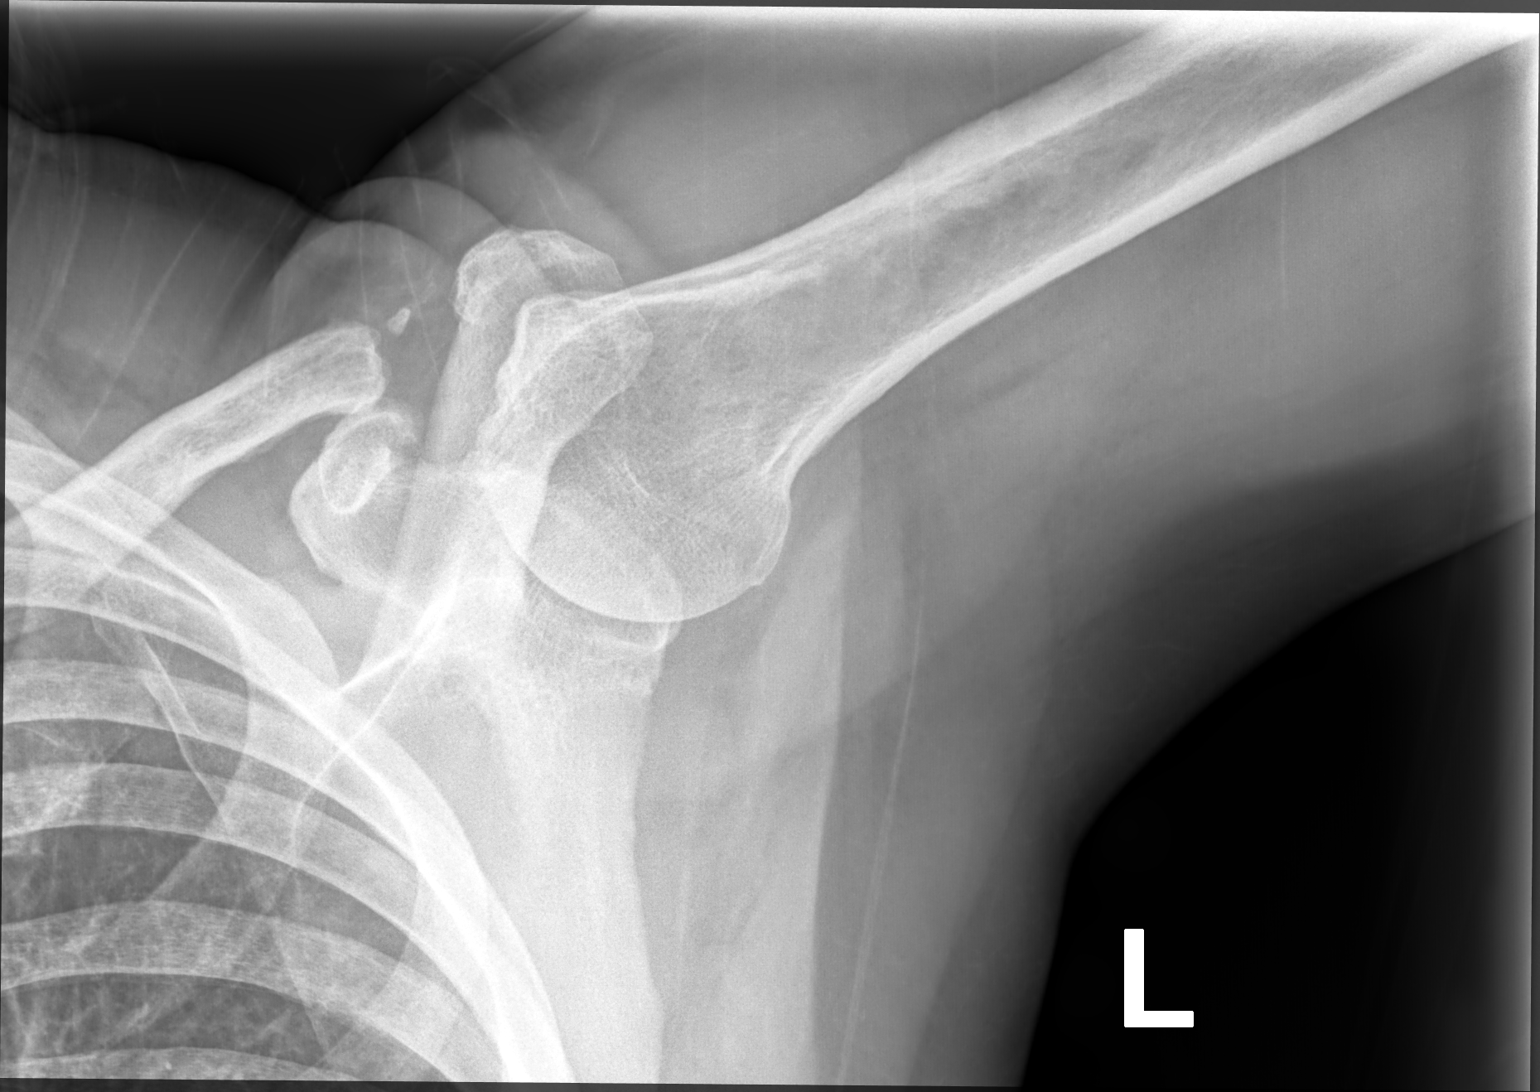

[shoulder (grashey) ap]
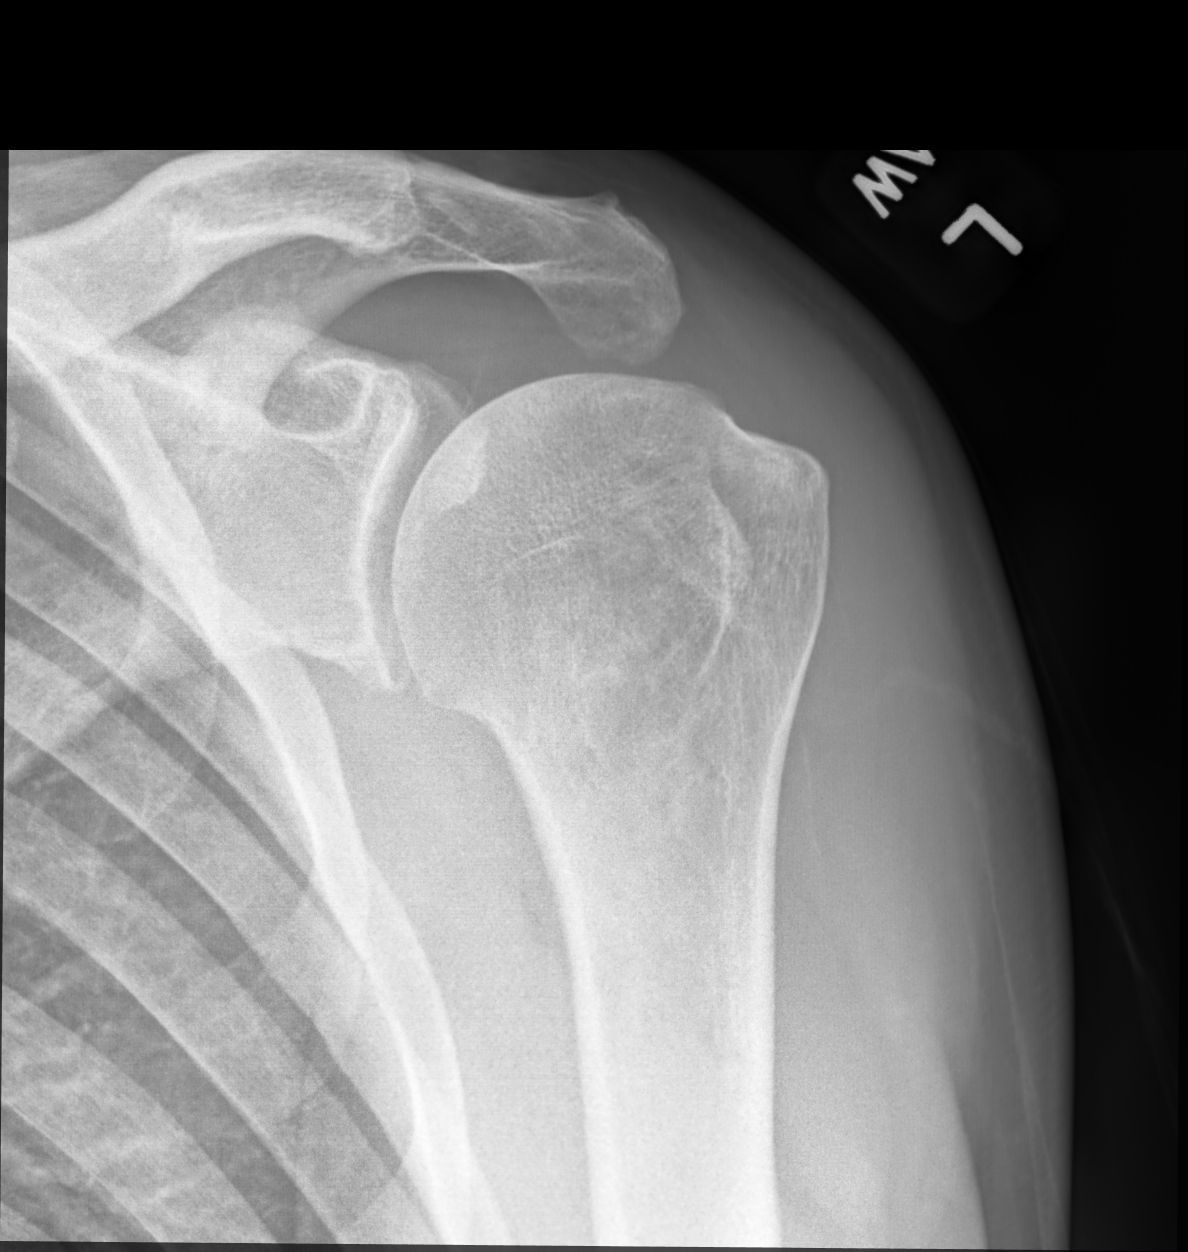

[shoulder y view]
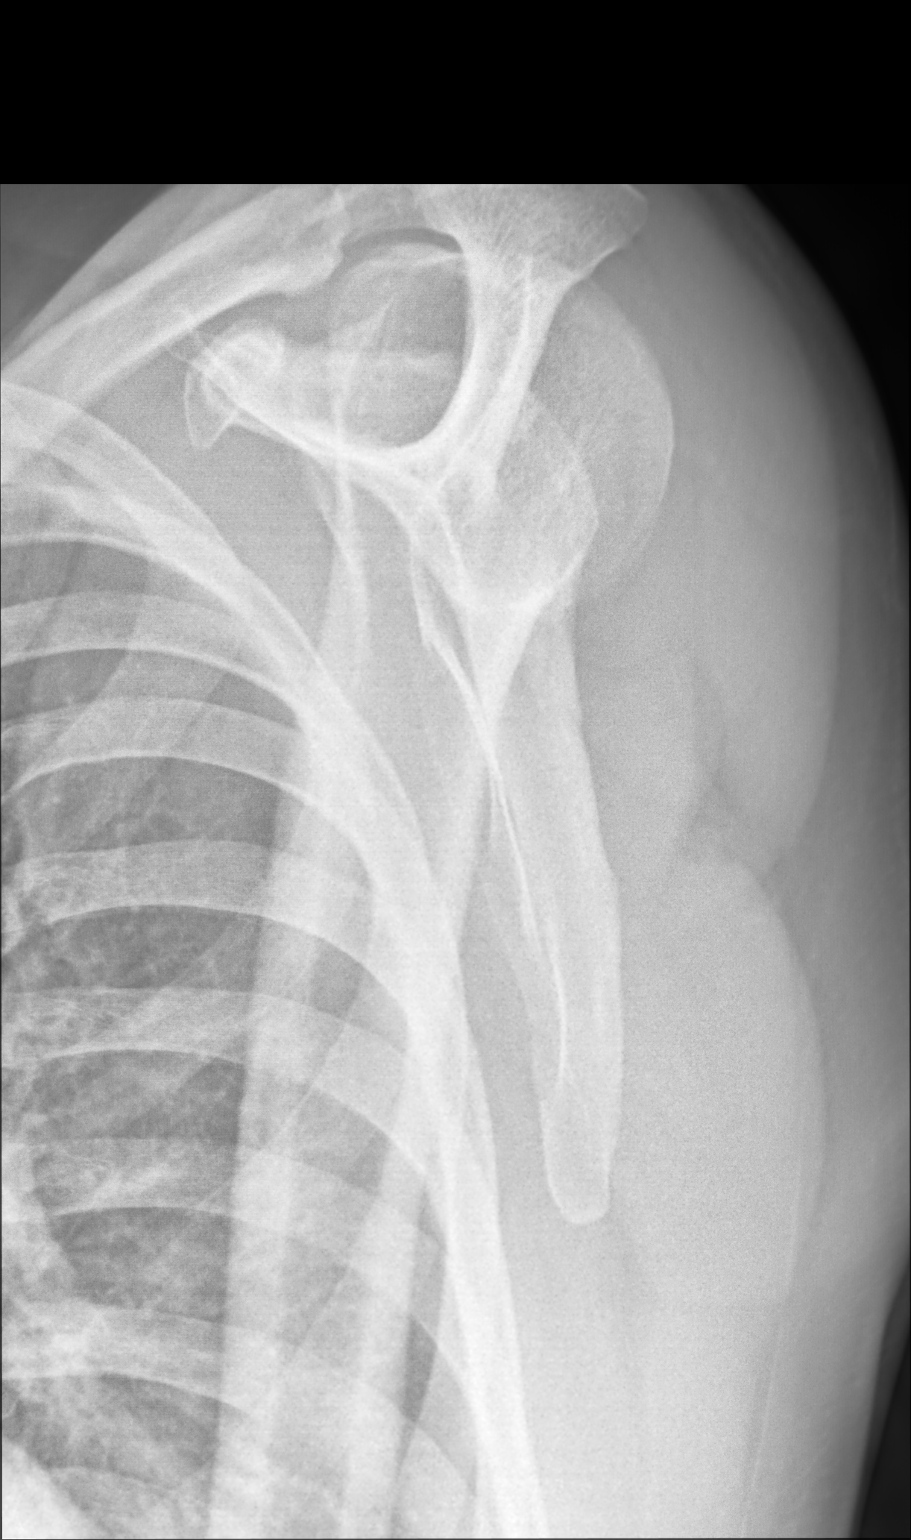

[3 of 3 positions shown; findings below may reference images not displayed]

FINDINGS: There is no evidence of fracture or dislocation. Resorption of the
distal clavicle is chronic and may be posttraumatic or inflammatory.
Soft tissues are unremarkable.
IMPRESSION: No acute findings. Resorption of the distal clavicle is chronic and
may be posttraumatic or inflammatory.

## 2021-05-22 ENCOUNTER — Encounter: Payer: Medicare Other | Admitting: Family Medicine

## 2021-06-03 ENCOUNTER — Encounter: Payer: Self-pay | Admitting: Family Medicine

## 2021-06-03 ENCOUNTER — Ambulatory Visit: Payer: Self-pay

## 2021-06-03 ENCOUNTER — Ambulatory Visit: Payer: Medicare Other | Admitting: Family Medicine

## 2021-06-03 VITALS — BP 152/98 | Ht 72.0 in | Wt 210.0 lb

## 2021-06-03 DIAGNOSIS — M25472 Effusion, left ankle: Secondary | ICD-10-CM | POA: Diagnosis not present

## 2021-06-03 DIAGNOSIS — M19072 Primary osteoarthritis, left ankle and foot: Secondary | ICD-10-CM | POA: Diagnosis not present

## 2021-06-03 MED ORDER — METHYLPREDNISOLONE ACETATE 40 MG/ML IJ SUSP
40.0000 mg | Freq: Once | INTRAMUSCULAR | Status: AC
  Administered 2021-06-03: 40 mg via INTRA_ARTICULAR

## 2021-06-03 NOTE — Progress Notes (Signed)
?  Jason Valencia - 71 y.o. male MRN [id number]  Date of birth: [birth year] ? ?SUBJECTIVE:  Including CC & ROS.  ?No chief complaint on file. ? ? ?Jason Valencia is a 71 y.o. male that is presenting with acute on chronic left foot pain.  He still experiences the pain in the lateral midfoot.  Pain has improved with prednisone. ? ? ?Review of Systems ?See HPI  ? ?HISTORY: Past Medical, Surgical, Social, and Family History Reviewed & Updated per EMR.   ?Pertinent Historical Findings include: ? ?Past Medical History:  ?Diagnosis Date  ? Cancer [facility])   ? prostate cancer- 2011  ? Hypertension   ? ? ?Past Surgical History:  ?Procedure Laterality Date  ? COLONOSCOPY    ? KNEE SURGERY    ? PROSTATE SURGERY    ? ROTATOR CUFF REPAIR    ? left  ? ? ? ?PHYSICAL EXAM:  ?VS: BP (!) 152/98 (BP Location: Left Arm, Patient Position: Sitting)   Ht 6' (1.829 m)   Wt 210 lb (95.3 kg)   BMI 28.48 kg/m?  ?Physical Exam ?Gen: NAD, alert, cooperative with exam, well-appearing ?MSK:  ?Neurovascularly intact   ? ? ?Aspiration/Injection Procedure Note ?Lolita Lenz Freimark ?1951-02-25 ? ?Procedure: Injection ?Indications: Left foot pain ? ?Procedure Details ?Consent: Risks of procedure as well as the alternatives and risks of each were explained to the (patient/caregiver).  Consent for procedure obtained. ?Time Out: Verified patient identification, verified procedure, site/side was marked, verified correct patient position, special equipment/implants available, medications/allergies/relevent history reviewed, required imaging and test results available.  Performed.  The area was cleaned with iodine and alcohol swabs.   ? ?The lateral midfoot joint was injected using 1 cc's of 40 mg Depo-Medrol and 2 cc's of 0.25% bupivacaine with a 25 1 1/2" needle.  Ultrasound was used. Images were obtained in long views showing the injection.   ? ? ?A sterile dressing was applied. ? ?Patient did tolerate procedure well. ? ? ? ? ?ASSESSMENT & PLAN:  ? ?Ankle  effusion, left ?Improvement of the ankle effusion. ? ?Arthrosis of left midfoot ?Acutely occurring.  Symptoms seem more consistent with the midfoot as opposed to the ankle joint. ?-Counseled on home exercise therapy and supportive care. ?-Injection today. ?-Could consider custom orthotics. ? ? ? ? ?

## 2021-06-03 NOTE — Assessment & Plan Note (Signed)
Acutely occurring.  Symptoms seem more consistent with the midfoot as opposed to the ankle joint. ?-Counseled on home exercise therapy and supportive care. ?-Injection today. ?-Could consider custom orthotics. ?

## 2021-06-03 NOTE — Patient Instructions (Signed)
Good to see you Please use ice as needed   Please send me a message in MyChart with any questions or updates.  Please see me back in 6-8 weeks.   --Dr. Irena Gaydos  

## 2021-06-03 NOTE — Assessment & Plan Note (Signed)
Improvement of the ankle effusion. ?

## 2021-07-22 ENCOUNTER — Encounter: Payer: Self-pay | Admitting: Family Medicine

## 2021-07-22 ENCOUNTER — Ambulatory Visit (INDEPENDENT_AMBULATORY_CARE_PROVIDER_SITE_OTHER): Payer: Medicare Other | Admitting: Family Medicine

## 2021-07-22 VITALS — BP 150/90 | Ht 72.0 in | Wt 210.0 lb

## 2021-07-22 DIAGNOSIS — M7711 Lateral epicondylitis, right elbow: Secondary | ICD-10-CM | POA: Diagnosis not present

## 2021-07-22 DIAGNOSIS — M5412 Radiculopathy, cervical region: Secondary | ICD-10-CM | POA: Diagnosis not present

## 2021-07-22 NOTE — Assessment & Plan Note (Signed)
Acutely and intermittent in nature.  Having tenderness at the lateral epicondyle. ?-Counseled on home exercise therapy and supportive care. ?-Shockwave therapy. ?-Could consider injection or physical therapy. ?

## 2021-07-22 NOTE — Progress Notes (Signed)
?  Jason Valencia - 71 y.o. male MRN [id number]  Date of birth: Dec 01, 1950 ? ?SUBJECTIVE:  Including CC & ROS.  ?No chief complaint on file. ? ? ?Jason Valencia is a 71 y.o. male that is presenting with right elbow pain and left arm altered sensation. ? ? ?Review of Systems ?See HPI  ? ?HISTORY: Past Medical, Surgical, Social, and Family History Reviewed & Updated per EMR.   ?Pertinent Historical Findings include: ? ?Past Medical History:  ?Diagnosis Date  ? Cancer [facility])   ? prostate cancer- 2011  ? Hypertension   ? ? ?Past Surgical History:  ?Procedure Laterality Date  ? COLONOSCOPY    ? KNEE SURGERY    ? PROSTATE SURGERY    ? ROTATOR CUFF REPAIR    ? left  ? ? ? ?PHYSICAL EXAM:  ?VS: BP (!) 150/90 (BP Location: Left Arm, Patient Position: Sitting)   Ht 6' (1.829 m)   Wt 210 lb (95.3 kg)   BMI 28.48 kg/m?  ?Physical Exam ?Gen: NAD, alert, cooperative with exam, well-appearing ?MSK:  ?Neurovascularly intact   ? ?ECSWT Note ?Lolita Lenz Maclaughlin ?February 27, 1951 ? ?Procedure: ECSWT ?Indications: right elbow pain ? ?Procedure Details ?Consent: Risks of procedure as well as the alternatives and risks of each were explained to the (patient/caregiver).  Consent for procedure obtained. ?Time Out: Verified patient identification, verified procedure, site/side was marked, verified correct patient position, special equipment/implants available, medications/allergies/relevent history reviewed, required imaging and test results available.  Performed.  The area was cleaned with iodine and alcohol swabs.   ? ?The right elbow was targeted for Extracorporeal shockwave therapy.  ? ?Preset: Lateral epicondylitis ?Power Level: 90 ?Frequency: 10 ?Impulse/cycles: 2000 ?Head size: Medium ?Session: 1st ? ?Patient did tolerate procedure well. ? ? ? ? ?ASSESSMENT & PLAN:  ? ?Lateral epicondylitis of right elbow ?Acutely and intermittent in nature.  Having tenderness at the lateral epicondyle. ?-Counseled on home exercise therapy and supportive  care. ?-Shockwave therapy. ?-Could consider injection or physical therapy. ? ?Cervical radiculopathy ?Acutely occurring in the left upper arm.  Seems more of a nerve origin with the ultra sensation and fasciculation but no pain. ?-Counseled on home exercise therapy and supportive care. ? ? ? ? ?

## 2021-07-22 NOTE — Assessment & Plan Note (Signed)
Acutely occurring in the left upper arm.  Seems more of a nerve origin with the ultra sensation and fasciculation but no pain. ?-Counseled on home exercise therapy and supportive care. ?

## 2021-07-26 ENCOUNTER — Other Ambulatory Visit: Payer: Self-pay | Admitting: Family Medicine

## 2021-07-26 DIAGNOSIS — I10 Essential (primary) hypertension: Secondary | ICD-10-CM

## 2021-08-05 ENCOUNTER — Ambulatory Visit: Payer: Medicare Other | Admitting: Family Medicine

## 2021-09-08 ENCOUNTER — Telehealth: Payer: Self-pay | Admitting: Family Medicine

## 2021-09-08 NOTE — Telephone Encounter (Signed)
Left message for patient to call back and schedule Medicare Annual Wellness Visit (AWV).   Please offer to do virtually or by telephone.  Left office number and my jabber #[phone].  Last AWV:10/04/2018  Please schedule at anytime with Nurse Health Advisor.   

## 2021-09-23 ENCOUNTER — Encounter: Payer: Self-pay | Admitting: Family Medicine

## 2021-09-23 ENCOUNTER — Ambulatory Visit (INDEPENDENT_AMBULATORY_CARE_PROVIDER_SITE_OTHER): Payer: Medicare Other | Admitting: Family Medicine

## 2021-09-23 VITALS — BP 148/80 | HR 94 | Temp 98.7°F | Ht 72.0 in | Wt 216.4 lb

## 2021-09-23 DIAGNOSIS — Z8546 Personal history of malignant neoplasm of prostate: Secondary | ICD-10-CM

## 2021-09-23 DIAGNOSIS — Z8739 Personal history of other diseases of the musculoskeletal system and connective tissue: Secondary | ICD-10-CM

## 2021-09-23 DIAGNOSIS — N5231 Erectile dysfunction following radical prostatectomy: Secondary | ICD-10-CM | POA: Insufficient documentation

## 2021-09-23 DIAGNOSIS — Z0000 Encounter for general adult medical examination without abnormal findings: Secondary | ICD-10-CM

## 2021-09-23 DIAGNOSIS — I10 Essential (primary) hypertension: Secondary | ICD-10-CM

## 2021-09-23 MED ORDER — SILDENAFIL CITRATE 100 MG PO TABS: 50.0000 mg | ORAL_TABLET | Freq: Every day | ORAL | 11 refills | Status: DC | PRN

## 2021-09-27 ENCOUNTER — Other Ambulatory Visit: Payer: Self-pay | Admitting: Emergency Medicine

## 2021-09-27 DIAGNOSIS — I10 Essential (primary) hypertension: Secondary | ICD-10-CM

## 2021-10-06 ENCOUNTER — Telehealth: Payer: Self-pay | Admitting: Family Medicine

## 2021-10-06 NOTE — Telephone Encounter (Signed)
Left message for patient to call back and schedule Medicare Annual Wellness Visit (AWV).   Please offer to do virtually or by telephone.  Left office number and my jabber [phone].  Last AWV:10/04/2018  Please schedule at anytime with Nurse Health Advisor.

## 2021-10-20 ENCOUNTER — Telehealth: Payer: Self-pay | Admitting: Family Medicine

## 2021-10-20 NOTE — Telephone Encounter (Signed)
Left message for patient to call back and schedule Medicare Annual Wellness Visit (AWV).   Please offer to do virtually or by telephone.  Left office number and my jabber [phone].  Last AWV:10/04/2018  Please schedule at anytime with Nurse Health Advisor.

## 2021-10-27 ENCOUNTER — Other Ambulatory Visit: Payer: Self-pay | Admitting: Emergency Medicine

## 2021-10-27 DIAGNOSIS — I10 Essential (primary) hypertension: Secondary | ICD-10-CM

## 2021-10-31 ENCOUNTER — Other Ambulatory Visit: Payer: Self-pay | Admitting: Family Medicine

## 2021-10-31 DIAGNOSIS — I10 Essential (primary) hypertension: Secondary | ICD-10-CM

## 2021-11-05 ENCOUNTER — Other Ambulatory Visit: Payer: Self-pay | Admitting: Family Medicine

## 2021-11-05 DIAGNOSIS — I10 Essential (primary) hypertension: Secondary | ICD-10-CM

## 2021-11-17 ENCOUNTER — Telehealth: Payer: Self-pay | Admitting: Family Medicine

## 2021-11-17 NOTE — Telephone Encounter (Signed)
Left message for patient to call back and schedule Medicare Annual Wellness Visit (AWV).   Please offer to do virtually or by telephone.  Left office number and my jabber [phone].  Last AWV:10/04/2018  Please schedule at anytime with Nurse Health Advisor.

## 2021-12-23 ENCOUNTER — Ambulatory Visit (INDEPENDENT_AMBULATORY_CARE_PROVIDER_SITE_OTHER): Payer: Medicare Other

## 2021-12-23 VITALS — Ht 72.0 in | Wt 215.0 lb

## 2021-12-23 DIAGNOSIS — Z0000 Encounter for general adult medical examination without abnormal findings: Secondary | ICD-10-CM

## 2021-12-23 NOTE — Progress Notes (Signed)
**Note Jason-Identified via Obfuscation** Subjective:   DEMORRIS Valencia is a 71 y.o. male who presents for Medicare Annual/Subsequent preventive examination.   Virtual Visit via Telephone Note  I connected with  Jason Valencia on 12/23/21 at 11:30 AM EDT by telephone and verified that I am speaking with the correct person using two identifiers.  Location: Patient: home  Provider: [facility]  Persons participating in the virtual visit: patient/Nurse Health Advisor   I discussed the limitations, risks, security and privacy concerns of performing an evaluation and management service by telephone and the availability of in person appointments. The patient expressed understanding and agreed to proceed.  Interactive audio and video telecommunications were attempted between this nurse and patient, however failed, due to patient having technical difficulties OR patient did not have access to video capability.  We continued and completed visit with audio only.  Some vital signs may be absent or patient reported.   Daphane Shepherd, LPN  Review of Systems     Cardiac Risk Factors include: advanced age (>11mn, >>42women)     Objective:    Today's Vitals   12/23/21 1129  Weight: 215 lb (97.5 kg)  Height: 6' (1.829 m)   Body mass index is 29.16 kg/m.     12/23/2021   11:33 AM 10/04/2018   10:04 AM 06/22/2016   10:17 AM  Advanced Directives  Does Patient Have a Medical Advance Directive? Yes No No  [place] of [place]    Copy of [place]? No - copy requested    Would patient like information on creating a medical advance directive?  No - Patient declined     Current Medications (verified) Outpatient Encounter Medications as of 12/23/2021  Medication Sig   amLODipine (NORVASC) 10 MG tablet TAKE 1 TABLET(10 MG) BY MOUTH DAILY   colchicine 0.6 MG tablet Take 1 tablet (0.6 mg total) by mouth 2 (two) times daily.   cyclobenzaprine (FLEXERIL) 10 MG  tablet Take 1 tablet (10 mg total) by mouth 3 (three) times daily as needed. One half tab PO qHS, then increase gradually to one tab TID.   hydrOXYzine (VISTARIL) 25 MG capsule May take one at night as needed for sleep.   ibuprofen (ADVIL) 800 MG tablet Take one every 8 hours with food as needed.   lisinopril-hydrochlorothiazide (ZESTORETIC) 20-25 MG tablet TAKE 1 TABLET BY MOUTH DAILY   sildenafil (VIAGRA) 100 MG tablet Take 0.5-1 tablets (50-100 mg total) by mouth daily as needed for erectile dysfunction.   No facility-administered encounter medications on file as of 12/23/2021.    Allergies (verified) Patient has no known allergies.   History: Past Medical History:  Diagnosis Date   Cancer ([facility]    prostate cancer- 2011   Hypertension    Past Surgical History:  Procedure Laterality Date   COLONOSCOPY     KNEE SURGERY     PROSTATE SURGERY     ROTATOR CUFF REPAIR     left   Family History  Problem Relation Age of Onset   Cancer Mother        ovarian   Heart disease Father    Cancer Sister        throat    Colon cancer Neg Hx    Esophageal cancer Neg Hx    Rectal cancer Neg Hx    Stomach cancer Neg Hx    Social History   Socioeconomic History   Marital status: Married  Spouse name: Not on file   Number of children: 2   Years of education: Not on file   Highest education level: Not on file  Occupational History   Not on file  Tobacco Use   Smoking status: Never   Smokeless tobacco: Never  Vaping Use   Vaping Use: Never used  Substance and Sexual Activity   Alcohol use: Yes    Alcohol/week: 2.0 standard drinks of alcohol    Types: 2 Standard drinks or equivalent per week   Drug use: No   Sexual activity: Yes  Other Topics Concern   Not on file  Social History Narrative   Not on file   Social Determinants of Health   Financial Resource Strain: Low Risk  (12/23/2021)   Overall Financial Resource Strain (CARDIA)    Difficulty of Paying Living Expenses:  Not hard at all  Food Insecurity: No Food Insecurity (12/23/2021)   Hunger Vital Sign    Worried About Running Out of Food in the Last Year: Never true    Ran Out of Food in the Last Year: Never true  Transportation Needs: No Transportation Needs (12/23/2021)   PRAPARE - Hydrologist (Medical): No    Lack of Transportation (Non-Medical): No  Physical Activity: Insufficiently Active (12/23/2021)   Exercise Vital Sign    Days of Exercise per Week: 3 days    Minutes of Exercise per Session: 40 min  Stress: No Stress Concern Present (12/23/2021)   [place]    Feeling of Stress : Not at all  Social Connections: Socially Integrated (12/23/2021)   Social Connection and Isolation Panel [NHANES]    Frequency of Communication with Friends and Family: More than three times a week    Frequency of Social Gatherings with Friends and Family: More than three times a week    Attends Religious Services: More than 4 times per year    Active Member of [employer] or Organizations: Yes    Attends Music therapist: More than 4 times per year    Marital Status: Married    Tobacco Counseling Counseling given: Not Answered   Clinical Intake:  Pre-visit preparation completed: Yes  Pain : No/denies pain     Nutritional Risks: None Diabetes: No  How often do you need to have someone help you when you read instructions, pamphlets, or other written materials from your doctor or pharmacy?: 1 - Never  Diabetic?no   Interpreter Needed?: No  Information entered by :: Jadene Pierini, LPN   Activities of Daily Living    12/23/2021   11:33 AM  In your present state of health, do you have any difficulty performing the following activities:  Hearing? 0  Vision? 0  Difficulty concentrating or making decisions? 0  Walking or climbing stairs? 0  Dressing or bathing? 0  Doing errands, shopping? 0   Preparing Food and eating ? N  Using the Toilet? N  In the past six months, have you accidently leaked urine? N  Do you have problems with loss of bowel control? N  Managing your Medications? N  Managing your Finances? N  Housekeeping or managing your Housekeeping? N    Patient Care Team: Libby Maw, MD as PCP - General (Family Medicine) Odis Luster, Huey Bienenstock, MD as Consulting Physician (Urology)  Indicate any recent Medical Services you may have received from other than [facility] providers in the past year (date may be approximate).  Assessment:   This is a routine wellness examination for Jason Valencia.  Hearing/Vision screen Vision Screening - Comments:: Patient to schedule annual eye exams   Dietary issues and exercise activities discussed: Current Exercise Habits: Home exercise routine, Type of exercise: walking;strength training/weights, Time (Minutes): 40, Frequency (Times/Week): 3, Weekly Exercise (Minutes/Week): 120, Intensity: Mild, Exercise limited by: None identified   Goals Addressed             This Visit's Progress    Increase physical activity   On track      Depression Screen    12/23/2021   11:32 AM 09/23/2021   11:57 AM 09/23/2021   11:23 AM 09/25/2020   12:49 PM 09/25/2020   10:40 AM 10/04/2018   10:03 AM 09/05/2018    1:57 PM  PHQ 2/9 Scores  PHQ - 2 Score 0 0 0 0 0 0 0  PHQ- 9 Score  1  1       Fall Risk    12/23/2021   11:30 AM 09/23/2021   11:24 AM 09/25/2020   10:41 AM 10/04/2018   10:03 AM 09/05/2018    1:57 PM  Fall Risk   Falls in the past year? 0 0 0 0 0  Number falls in past yr: 0 0  0   Injury with Fall? 0   0   Risk for fall due to : No Fall Risks      Follow up Falls prevention discussed   Falls evaluation completed;Education provided;Falls prevention discussed Falls evaluation completed    FALL RISK PREVENTION PERTAINING TO THE HOME:  Any stairs in or around the home? No  If so, are there any without handrails? No  Home  free of loose throw rugs in walkways, pet beds, electrical cords, etc? Yes  Adequate lighting in your home to reduce risk of falls? Yes   ASSISTIVE DEVICES UTILIZED TO PREVENT FALLS:  Life alert? No  Use of a cane, walker or w/c? No  Grab bars in the bathroom? No  Shower chair or bench in shower? Yes  Elevated toilet seat or a handicapped toilet? No          12/23/2021   11:34 AM 10/04/2018   10:06 AM  6CIT Screen  What Year? 0 points 0 points  What month? 0 points 0 points  What time? 0 points 0 points  Count back from 20 0 points 0 points  Months in reverse 0 points 0 points  Repeat phrase 0 points 0 points  Total Score 0 points 0 points    Immunizations Immunization History  Administered Date(s) Administered   Fluad Quad(high Dose 65+) 12/16/2018   Influenza Split 02/25/2017   Influenza, High Dose Seasonal PF 02/25/2017   Influenza,inj,Quad PF,6+ Mos 02/13/2013, 12/11/2014, 02/19/2016   PFIZER(Purple Top)SARS-COV-2 Vaccination 06/02/2019, 06/28/2019, 01/27/2020   Pneumococcal Polysaccharide-23 12/16/2018   Td 02/13/2013    TDAP status: Up to date  Flu Vaccine status: Due, Education has been provided regarding the importance of this vaccine. Advised may receive this vaccine at local pharmacy or Health Dept. Aware to provide a copy of the vaccination record if obtained from local pharmacy or Health Dept. Verbalized acceptance and understanding.  Pneumococcal vaccine status: Up to date  Covid-19 vaccine status: Completed vaccines  Qualifies for Shingles Vaccine? Yes   Zostavax completed No   Shingrix Completed?: No.    Education has been provided regarding the importance of this vaccine. Patient has been advised to call insurance company to determine out  of pocket expense if they have not yet received this vaccine. Advised may also receive vaccine at local pharmacy or Health Dept. Verbalized acceptance and understanding.  Screening Tests Health Maintenance  Topic  Date Due   INFLUENZA VACCINE  10/28/2021   Zoster Vaccines- Shingrix (1 of 2) 12/24/2021 (Originally 10/06/1969)   Pneumonia Vaccine 43+ Years old (2 - PCV) 09/24/2022 (Originally 12/16/2019)   TETANUS/TDAP  02/14/2023   COLONOSCOPY (Pts 45-28yr Insurance coverage will need to be confirmed)  11/22/2025   Hepatitis C Screening  Completed   HPV VACCINES  Aged Out   COVID-19 Vaccine  Discontinued    Health Maintenance  Health Maintenance Due  Topic Date Due   INFLUENZA VACCINE  10/28/2021    Colorectal cancer screening: Type of screening: Colonoscopy. Completed 08/262020. Repeat every 7 years  Lung Cancer Screening: (Low Dose CT Chest recommended if Age 70-80years, 30 pack-year currently smoking OR have quit w/in 15years.) does not qualify.   Lung Cancer Screening Referral: n/a  Additional Screening:  Hepatitis C Screening: does not qualify;   Vision Screening: Recommended annual ophthalmology exams for early detection of glaucoma and other disorders of the eye. Is the patient up to date with their annual eye exam?  No  Who is the provider or what is the name of the office in which the patient attends annual eye exams? None patient schedule  If pt is not established with a provider, would they like to be referred to a provider to establish care? No .   Dental Screening: Recommended annual dental exams for proper oral hygiene  Community Resource Referral / Chronic Care Management: CRR required this visit?  Yes   CCM required this visit?  No      Plan:     I have personally reviewed and noted the following in the patient's chart:   Medical and social history Use of alcohol, tobacco or illicit drugs  Current medications and supplements including opioid prescriptions. Patient is not currently taking opioid prescriptions. Functional ability and status Nutritional status Physical activity Advanced directives List of other physicians Hospitalizations, surgeries, and ER  visits in previous 12 months Vitals Screenings to include cognitive, depression, and falls Referrals and appointments  In addition, I have reviewed and discussed with patient certain preventive protocols, quality metrics, and best practice recommendations. A written personalized care plan for preventive services as well as general preventive health recommendations were provided to patient.     LDaphane Shepherd LPN   [id number]  Nurse Notes: due 2nd does Shringrix  and flu vaccine

## 2021-12-23 NOTE — Patient Instructions (Signed)
Jason Valencia , Thank you for taking time to come for your Medicare Wellness Visit. I appreciate your ongoing commitment to your health goals. Please review the following plan we discussed and let me know if I can assist you in the future.   These are the goals we discussed:  Goals      Increase physical activity     Weight (lb) < 200 lb (90.7 kg)     Eat healthier/ increase exercise         This is a list of the screening recommended for you and due dates:  Health Maintenance  Topic Date Due   Flu Shot  10/28/2021   Zoster (Shingles) Vaccine (1 of 2) 12/24/2021*   Pneumonia Vaccine (2 - PCV) 09/24/2022*   Tetanus Vaccine  02/14/2023   Colon Cancer Screening  11/22/2025   Hepatitis C Screening: USPSTF Recommendation to screen - Ages 18-79 yo.  Completed   HPV Vaccine  Aged Out   COVID-19 Vaccine  Discontinued  *Topic was postponed. The date shown is not the original due date.    Advanced directives: Advance directive discussed with you today. I have provided a copy for you to complete at home and have notarized. Once this is complete please bring a copy in to our office so we can scan it into your chart.   Conditions/risks identified: Aim for 30 minutes of exercise or brisk walking, 6-8 glasses of water, and 5 servings of fruits and vegetables each day.   Next appointment: Follow up in one year for your annual wellness visit.   Preventive Care 70 Years and Older, Male  Preventive care refers to lifestyle choices and visits with your health care provider that can promote health and wellness. What does preventive care include? A yearly physical exam. This is also called an annual well check. Dental exams once or twice a year. Routine eye exams. Ask your health care provider how often you should have your eyes checked. Personal lifestyle choices, including: Daily care of your teeth and gums. Regular physical activity. Eating a healthy diet. Avoiding tobacco and drug  use. Limiting alcohol use. Practicing safe sex. Taking low doses of aspirin every day. Taking vitamin and mineral supplements as recommended by your health care provider. What happens during an annual well check? The services and screenings done by your health care provider during your annual well check will depend on your age, overall health, lifestyle risk factors, and family history of disease. Counseling  Your health care provider may ask you questions about your: Alcohol use. Tobacco use. Drug use. Emotional well-being. Home and relationship well-being. Sexual activity. Eating habits. History of falls. Memory and ability to understand (cognition). Work and work Statistician. Screening  You may have the following tests or measurements: Height, weight, and BMI. Blood pressure. Lipid and cholesterol levels. These may be checked every 5 years, or more frequently if you are over 52 years old. Skin check. Lung cancer screening. You may have this screening every year starting at age 23 if you have a 30-pack-year history of smoking and currently smoke or have quit within the past 15 years. Fecal occult blood test (FOBT) of the stool. You may have this test every year starting at age 21. Flexible sigmoidoscopy or colonoscopy. You may have a sigmoidoscopy every 5 years or a colonoscopy every 10 years starting at age 48. Prostate cancer screening. Recommendations will vary depending on your family history and other risks. Hepatitis C blood test. Hepatitis B blood  test. Sexually transmitted disease (STD) testing. Diabetes screening. This is done by checking your blood sugar (glucose) after you have not eaten for a while (fasting). You may have this done every 1-3 years. Abdominal aortic aneurysm (AAA) screening. You may need this if you are a current or former smoker. Osteoporosis. You may be screened starting at age 13 if you are at high risk. Talk with your health care provider about  your test results, treatment options, and if necessary, the need for more tests. Vaccines  Your health care provider may recommend certain vaccines, such as: Influenza vaccine. This is recommended every year. Tetanus, diphtheria, and acellular pertussis (Tdap, Td) vaccine. You may need a Td booster every 10 years. Zoster vaccine. You may need this after age 64. Pneumococcal 13-valent conjugate (PCV13) vaccine. One dose is recommended after age 52. Pneumococcal polysaccharide (PPSV23) vaccine. One dose is recommended after age 73. Talk to your health care provider about which screenings and vaccines you need and how often you need them. This information is not intended to replace advice given to you by your health care provider. Make sure you discuss any questions you have with your health care provider. Document Released: 04/12/2015 Document Revised: 12/04/2015 Document Reviewed: 01/15/2015 Elsevier Interactive Patient Education  2017 [employer] Prevention in the Home Falls can cause injuries. They can happen to people of all ages. There are many things you can do to make your home safe and to help prevent falls. What can I do on the outside of my home? Regularly fix the edges of walkways and driveways and fix any cracks. Remove anything that might make you trip as you walk through a door, such as a raised step or threshold. Trim any bushes or trees on the path to your home. Use bright outdoor lighting. Clear any walking paths of anything that might make someone trip, such as rocks or tools. Regularly check to see if handrails are loose or broken. Make sure that both sides of any steps have handrails. Any raised decks and porches should have guardrails on the edges. Have any leaves, snow, or ice cleared regularly. Use sand or salt on walking paths during winter. Clean up any spills in your garage right away. This includes oil or grease spills. What can I do in the bathroom? Use  night lights. Install grab bars by the toilet and in the tub and shower. Do not use towel bars as grab bars. Use non-skid mats or decals in the tub or shower. If you need to sit down in the shower, use a plastic, non-slip stool. Keep the floor dry. Clean up any water that spills on the floor as soon as it happens. Remove soap buildup in the tub or shower regularly. Attach bath mats securely with double-sided non-slip rug tape. Do not have throw rugs and other things on the floor that can make you trip. What can I do in the bedroom? Use night lights. Make sure that you have a light by your bed that is easy to reach. Do not use any sheets or blankets that are too big for your bed. They should not hang down onto the floor. Have a firm chair that has side arms. You can use this for support while you get dressed. Do not have throw rugs and other things on the floor that can make you trip. What can I do in the kitchen? Clean up any spills right away. Avoid walking on wet floors. Keep items that  you use a lot in easy-to-reach places. If you need to reach something above you, use a strong step stool that has a grab bar. Keep electrical cords out of the way. Do not use floor polish or wax that makes floors slippery. If you must use wax, use non-skid floor wax. Do not have throw rugs and other things on the floor that can make you trip. What can I do with my stairs? Do not leave any items on the stairs. Make sure that there are handrails on both sides of the stairs and use them. Fix handrails that are broken or loose. Make sure that handrails are as long as the stairways. Check any carpeting to make sure that it is firmly attached to the stairs. Fix any carpet that is loose or worn. Avoid having throw rugs at the top or bottom of the stairs. If you do have throw rugs, attach them to the floor with carpet tape. Make sure that you have a light switch at the top of the stairs and the bottom of the  stairs. If you do not have them, ask someone to add them for you. What else can I do to help prevent falls? Wear shoes that: Do not have high heels. Have rubber bottoms. Are comfortable and fit you well. Are closed at the toe. Do not wear sandals. If you use a stepladder: Make sure that it is fully opened. Do not climb a closed stepladder. Make sure that both sides of the stepladder are locked into place. Ask someone to hold it for you, if possible. Clearly mark and make sure that you can see: Any grab bars or handrails. First and last steps. Where the edge of each step is. Use tools that help you move around (mobility aids) if they are needed. These include: Canes. Walkers. Scooters. Crutches. Turn on the lights when you go into a dark area. Replace any light bulbs as soon as they burn out. Set up your furniture so you have a clear path. Avoid moving your furniture around. If any of your floors are uneven, fix them. If there are any pets around you, be aware of where they are. Review your medicines with your doctor. Some medicines can make you feel dizzy. This can increase your chance of falling. Ask your doctor what other things that you can do to help prevent falls. This information is not intended to replace advice given to you by your health care provider. Make sure you discuss any questions you have with your health care provider. Document Released: 01/10/2009 Document Revised: 08/22/2015 Document Reviewed: 04/20/2014 Elsevier Interactive Patient Education  2017 [employer].

## 2022-02-11 ENCOUNTER — Telehealth: Payer: Self-pay | Admitting: Family Medicine

## 2022-02-11 DIAGNOSIS — M545 Low back pain, unspecified: Secondary | ICD-10-CM

## 2022-02-11 NOTE — Telephone Encounter (Signed)
Caller Name: Gleason Ardoin Call back phone #: [phone]  Reason for Call: Please call in Ibuprofen to  [facility], [address], [place] hone: [phone]  Fax: [phone]

## 2022-02-12 MED ORDER — IBUPROFEN 800 MG PO TABS: ORAL_TABLET | ORAL | 0 refills | Status: DC

## 2022-03-11 ENCOUNTER — Other Ambulatory Visit: Payer: Self-pay | Admitting: Family Medicine

## 2022-03-11 DIAGNOSIS — I10 Essential (primary) hypertension: Secondary | ICD-10-CM

## 2022-03-25 ENCOUNTER — Ambulatory Visit: Payer: Medicare Other | Admitting: Family Medicine

## 2022-04-07 ENCOUNTER — Telehealth: Payer: Self-pay | Admitting: Family Medicine

## 2022-04-07 ENCOUNTER — Ambulatory Visit: Payer: BLUE CROSS/BLUE SHIELD | Admitting: Family Medicine

## 2022-04-07 NOTE — Telephone Encounter (Signed)
Same day cancellation for OV with Dr. Ethelene Hal. First, sent letter

## 2022-04-14 NOTE — Telephone Encounter (Signed)
1st no show, fee waived, letter sent 

## 2022-04-16 ENCOUNTER — Ambulatory Visit (INDEPENDENT_AMBULATORY_CARE_PROVIDER_SITE_OTHER): Payer: Medicare Other | Admitting: Family Medicine

## 2022-04-16 ENCOUNTER — Encounter: Payer: Self-pay | Admitting: Family Medicine

## 2022-04-16 ENCOUNTER — Ambulatory Visit: Payer: Medicare Other | Admitting: Family Medicine

## 2022-04-16 VITALS — BP 148/86 | HR 97 | Temp 98.3°F | Ht 72.0 in | Wt 223.4 lb

## 2022-04-16 DIAGNOSIS — Z8739 Personal history of other diseases of the musculoskeletal system and connective tissue: Secondary | ICD-10-CM

## 2022-04-16 DIAGNOSIS — I10 Essential (primary) hypertension: Secondary | ICD-10-CM

## 2022-04-16 DIAGNOSIS — Z8546 Personal history of malignant neoplasm of prostate: Secondary | ICD-10-CM | POA: Diagnosis not present

## 2022-04-16 DIAGNOSIS — G8929 Other chronic pain: Secondary | ICD-10-CM

## 2022-04-16 DIAGNOSIS — R7309 Other abnormal glucose: Secondary | ICD-10-CM

## 2022-04-16 DIAGNOSIS — R7989 Other specified abnormal findings of blood chemistry: Secondary | ICD-10-CM | POA: Diagnosis not present

## 2022-04-16 DIAGNOSIS — M545 Low back pain, unspecified: Secondary | ICD-10-CM

## 2022-04-16 DIAGNOSIS — Z0000 Encounter for general adult medical examination without abnormal findings: Secondary | ICD-10-CM | POA: Diagnosis not present

## 2022-04-16 LAB — COMPREHENSIVE METABOLIC PANEL
ALT: 16 U/L (ref 0–53)
AST: 18 U/L (ref 0–37)
Albumin: 4.8 g/dL (ref 3.5–5.2)
Alkaline Phosphatase: 69 U/L (ref 39–117)
BUN: 15 mg/dL (ref 6–23)
CO2: 28 mEq/L (ref 19–32)
Calcium: 10.4 mg/dL (ref 8.4–10.5)
Chloride: 98 mEq/L (ref 96–112)
Creatinine, Ser: 0.97 mg/dL (ref 0.40–1.50)
GFR: 79.08 mL/min (ref >60.00)
Glucose, Bld: 104 mg/dL — ABNORMAL HIGH (ref 70–99)
Potassium: 3.8 mEq/L (ref 3.5–5.1)
Sodium: 138 mEq/L (ref 135–145)
Total Bilirubin: 0.8 mg/dL (ref 0.2–1.2)
Total Protein: 7.9 g/dL (ref 6.0–8.3)

## 2022-04-16 LAB — LIPID PANEL
Cholesterol: 208 mg/dL — ABNORMAL HIGH (ref 0–200)
HDL: 72.8 mg/dL (ref >39.00)
LDL Cholesterol: 115 mg/dL — ABNORMAL HIGH (ref 0–99)
NonHDL: 134.91
Total CHOL/HDL Ratio: 3
Triglycerides: 102 mg/dL (ref 0.0–149.0)
VLDL: 20.4 mg/dL (ref 0.0–40.0)

## 2022-04-16 LAB — CBC
HCT: 48.1 % (ref 39.0–52.0)
Hemoglobin: 16.2 g/dL (ref 13.0–17.0)
MCHC: 33.6 g/dL (ref 30.0–36.0)
MCV: 83.3 fl (ref 78.0–100.0)
Platelets: 194 10*3/uL (ref 150.0–400.0)
RBC: 5.78 Mil/uL (ref 4.22–5.81)
RDW: 13.9 % (ref 11.5–15.5)
WBC: 5.4 10*3/uL (ref 4.0–10.5)

## 2022-04-16 LAB — URINALYSIS, ROUTINE W REFLEX MICROSCOPIC
Bilirubin Urine: NEGATIVE
Hgb urine dipstick: NEGATIVE
Ketones, ur: NEGATIVE
Leukocytes,Ua: NEGATIVE
Nitrite: NEGATIVE
RBC / HPF: NONE SEEN (ref 0–?)
Specific Gravity, Urine: 1.025 (ref 1.000–1.030)
Total Protein, Urine: NEGATIVE
Urine Glucose: NEGATIVE
Urobilinogen, UA: 0.2 (ref 0.0–1.0)
pH: 6 (ref 5.0–8.0)

## 2022-04-16 LAB — HEMOGLOBIN A1C: Hgb A1c MFr Bld: 5.9 % (ref 4.6–6.5)

## 2022-04-16 LAB — TSH: TSH: 0.76 u[IU]/mL (ref 0.35–5.50)

## 2022-04-16 LAB — URIC ACID: Uric Acid, Serum: 6.5 mg/dL (ref 4.0–7.8)

## 2022-04-16 LAB — PSA: PSA: 0 ng/mL — ABNORMAL LOW (ref 0.10–4.00)

## 2022-04-16 MED ORDER — CARVEDILOL 6.25 MG PO TABS: 6.2500 mg | ORAL_TABLET | Freq: Two times a day (BID) | ORAL | 3 refills | Status: DC

## 2022-04-16 NOTE — Progress Notes (Signed)
You pends is  Established Patient Office Visit   Subjective:  Patient ID: Jason Valencia, male    DOB: 12-03-1950  Age: 72 y.o. MRN: [id number]  Chief Complaint  Patient presents with   Follow-up    6 month follow up, check right knee little painful at times. Patient fasting    HPI Encounter Diagnoses  Name Primary?   Chronic midline low back pain without sciatica Yes   Essential hypertension    Elevated glucose    Abnormal TSH    History of prostate cancer    History of gout    Healthcare maintenance    For follow-up of of above.  Continues Zestoretic and amlodipine for hypertension.  Blood pressure has been elevated at multiple clinic visits.  He says it has been a little lower at home.  TSH has been depressed.  Pulse rate is somewhat increased.  Seeing sports medicine for muscle and joint pains.   Review of Systems  Constitutional: Negative.   HENT: Negative.    Eyes:  Negative for blurred vision, discharge and redness.  Respiratory: Negative.    Cardiovascular: Negative.   Gastrointestinal:  Negative for abdominal pain.  Genitourinary: Negative.   Musculoskeletal:  Positive for joint pain. Negative for myalgias.  Skin:  Negative for rash.  Neurological:  Negative for tingling, loss of consciousness and weakness.  Endo/Heme/Allergies:  Negative for polydipsia.     Current Outpatient Medications:    amLODipine (NORVASC) 10 MG tablet, TAKE 1 TABLET(10 MG) BY MOUTH DAILY, Disp: 90 tablet, Rfl: 3   ascorbic acid (VITAMIN C) 100 MG tablet, Take 1 tablet by mouth daily., Disp: , Rfl:    carvedilol (COREG) 6.25 MG tablet, Take 1 tablet (6.25 mg total) by mouth 2 (two) times daily with a meal., Disp: 60 tablet, Rfl: 3   Cholecalciferol (VITAMIN D-1000 MAX ST) 25 MCG (1000 UT) tablet, Take by mouth., Disp: , Rfl:    Coenzyme Q10 50 MG CAPS, Take 1 capsule by mouth daily., Disp: , Rfl:    colchicine 0.6 MG tablet, Take 1 tablet (0.6 mg total) by mouth 2 (two) times daily.,  Disp: 60 tablet, Rfl: 2   cyanocobalamin 100 MCG tablet, Take 1 tablet by mouth daily., Disp: , Rfl:    cyclobenzaprine (FLEXERIL) 10 MG tablet, Take 1 tablet (10 mg total) by mouth 3 (three) times daily as needed. One half tab PO qHS, then increase gradually to one tab TID., Disp: 45 tablet, Rfl: 1   hydrOXYzine (VISTARIL) 25 MG capsule, May take one at night as needed for sleep., Disp: 30 capsule, Rfl: 0   ibuprofen (ADVIL) 800 MG tablet, Take one every 8 hours with food as needed., Disp: 90 tablet, Rfl: 0   lisinopril-hydrochlorothiazide (ZESTORETIC) 20-25 MG tablet, TAKE 1 TABLET BY MOUTH DAILY, Disp: 90 tablet, Rfl: 0   sildenafil (VIAGRA) 100 MG tablet, Take 0.5-1 tablets (50-100 mg total) by mouth daily as needed for erectile dysfunction., Disp: 5 tablet, Rfl: 11   Objective:     BP (!) 160/84 (BP Location: Right Arm, Patient Position: Sitting, Cuff Size: Large)   Pulse 97   Temp 98.3 F (36.8 C) (Temporal)   Ht 6' (1.829 m)   Wt 223 lb 6.4 oz (101.3 kg)   SpO2 97%   BMI 30.30 kg/m  BP Readings from Last 3 Encounters:  04/16/22 (!) 160/84  09/23/21 (!) 148/80  07/22/21 (!) 150/90   Wt Readings from Last 3 Encounters:  04/16/22 223 lb 6.4  oz (101.3 kg)  12/23/21 215 lb (97.5 kg)  09/23/21 216 lb 6.4 oz (98.2 kg)      Physical Exam Constitutional:      General: He is not in acute distress.    Appearance: Normal appearance. He is not ill-appearing, toxic-appearing or diaphoretic.  HENT:     Head: Normocephalic and atraumatic.     Right Ear: External ear normal.     Left Ear: External ear normal.     Mouth/Throat:     Mouth: Mucous membranes are moist.     Pharynx: Oropharynx is clear. No oropharyngeal exudate or posterior oropharyngeal erythema.  Eyes:     General: No scleral icterus.       Right eye: No discharge.        Left eye: No discharge.     Extraocular Movements: Extraocular movements intact.     Conjunctiva/sclera: Conjunctivae normal.     Pupils: Pupils  are equal, round, and reactive to light.  Cardiovascular:     Rate and Rhythm: Normal rate and regular rhythm.  Pulmonary:     Effort: Pulmonary effort is normal. No respiratory distress.     Breath sounds: Normal breath sounds.  Abdominal:     General: Bowel sounds are normal.  Musculoskeletal:     Cervical back: No rigidity or tenderness.  Skin:    General: Skin is warm and dry.  Neurological:     Mental Status: He is alert and oriented to person, place, and time.  Psychiatric:        Mood and Affect: Mood normal.        Behavior: Behavior normal.      No results found for any visits on 04/16/22.    The 10-year ASCVD risk score (Arnett DK, et al., 2019) is: 24.5%    Assessment & Plan:   Chronic midline low back pain without sciatica  Essential hypertension -     Carvedilol; Take 1 tablet (6.25 mg total) by mouth 2 (two) times daily with a meal.  Dispense: 60 tablet; Refill: 3 -     CBC -     Comprehensive metabolic panel -     Urinalysis, Routine w reflex microscopic  Elevated glucose -     Comprehensive metabolic panel -     Hemoglobin A1c  Abnormal TSH -     TSH  History of prostate cancer -     PSA  History of gout -     Uric acid  Healthcare maintenance -     Lipid panel    Return in about 6 weeks (around 05/28/2022), or Please try to lose some weight..  Will schedule follow-up with Dr. Tamala Julian for right knee pain.  Have added carvedilol for better blood pressure control.  Information given on this medication.  Rechecking PSA status post history of prostatectomy for prostate cancer.  Rechecking uric acid.  TSH has been depressed.  Libby Maw, MD

## 2022-04-23 DIAGNOSIS — N393 Stress incontinence (female) (male): Secondary | ICD-10-CM | POA: Diagnosis not present

## 2022-04-27 ENCOUNTER — Other Ambulatory Visit: Payer: Self-pay

## 2022-04-27 ENCOUNTER — Other Ambulatory Visit: Payer: Self-pay | Admitting: Family Medicine

## 2022-04-27 ENCOUNTER — Telehealth: Payer: Self-pay | Admitting: Family Medicine

## 2022-04-27 DIAGNOSIS — F5101 Primary insomnia: Secondary | ICD-10-CM

## 2022-04-27 MED ORDER — METHOCARBAMOL 750 MG PO TABS: 750.0000 mg | ORAL_TABLET | Freq: Four times a day (QID) | ORAL | 1 refills | Status: DC

## 2022-04-27 NOTE — Telephone Encounter (Signed)
Patient called and asked for a copy of his most recent lab results to be mailed to him. Address [address] [place] [address]

## 2022-04-27 NOTE — Telephone Encounter (Signed)
Patient called and would also like a refill on methocarbamol as well

## 2022-04-27 NOTE — Telephone Encounter (Signed)
Rx pended

## 2022-04-27 NOTE — Telephone Encounter (Signed)
Results printed and mailed to address provided. Dm/cma

## 2022-05-13 ENCOUNTER — Other Ambulatory Visit: Payer: Self-pay | Admitting: Family Medicine

## 2022-05-27 ENCOUNTER — Ambulatory Visit: Payer: Medicare Other | Admitting: Family Medicine

## 2022-06-02 ENCOUNTER — Ambulatory Visit: Payer: Medicare Other | Admitting: Family Medicine

## 2022-06-05 ENCOUNTER — Ambulatory Visit: Payer: Medicare Other | Admitting: Family Medicine

## 2022-06-05 ENCOUNTER — Encounter: Payer: Self-pay | Admitting: Family Medicine

## 2022-06-05 ENCOUNTER — Telehealth: Payer: Self-pay

## 2022-06-05 ENCOUNTER — Ambulatory Visit: Payer: Self-pay

## 2022-06-05 VITALS — BP 130/76 | Ht 72.0 in | Wt 216.0 lb

## 2022-06-05 DIAGNOSIS — M47817 Spondylosis without myelopathy or radiculopathy, lumbosacral region: Secondary | ICD-10-CM

## 2022-06-05 DIAGNOSIS — M1711 Unilateral primary osteoarthritis, right knee: Secondary | ICD-10-CM

## 2022-06-05 DIAGNOSIS — M25561 Pain in right knee: Secondary | ICD-10-CM

## 2022-06-05 MED ORDER — HYDROCODONE-ACETAMINOPHEN 5-325 MG PO TABS: 1.0000 | ORAL_TABLET | Freq: Three times a day (TID) | ORAL | 0 refills | Status: DC | PRN

## 2022-06-05 NOTE — Progress Notes (Signed)
   06/05/2022  Patient ID: Jason Valencia, male   DOB: 1950/09/26, 73 y.o.   MRN: [id number]  Patient appearing on report for [facility] - Hypertension Control report due to last documented ambulatory blood pressure of 130/73 on 06/05/22. Next appointment with PCP is 06/11/22   Outreached patient to discuss hypertension control and medication management. Left voicemail for patient to return my call at their convenience.   Darlina Guys, PharmD, DPLA

## 2022-06-05 NOTE — Assessment & Plan Note (Signed)
Acute on chronic in nature.  Has known degenerative changes from previous MRI. -Counseled on home exercise therapy and supportive care. -Norco. -Could consider facet injections.

## 2022-06-05 NOTE — Progress Notes (Signed)
  Jason Valencia - 72 y.o. male MRN [id number]  Date of birth: 23-Aug-1950  SUBJECTIVE:  Including CC & ROS.  No chief complaint on file.   Jason Valencia is a 72 y.o. male that is presenting with acute right knee pain.  He has a history of surgery.  Having pain in the medial aspect.  Also has acute on chronic low back pain.    Review of Systems See HPI   HISTORY: Past Medical, Surgical, Social, and Family History Reviewed & Updated per EMR.   Pertinent Historical Findings include:  Past Medical History:  Diagnosis Date   Cancer ([place])    prostate cancer- 2011   Hypertension     Past Surgical History:  Procedure Laterality Date   COLONOSCOPY     KNEE SURGERY     PROSTATE SURGERY     ROTATOR CUFF REPAIR     left     PHYSICAL EXAM:  VS: BP 130/76 (BP Location: Left Arm, Patient Position: Sitting)   Ht 6' (1.829 m)   Wt 216 lb (98 kg)   BMI 29.29 kg/m  Physical Exam Gen: NAD, alert, cooperative with exam, well-appearing MSK:  Neurovascularly intact    Limited ultrasound: Right knee pain:  No effusion suprapatellar pouch. Normal-appearing quadricep and patellar tendon. Medial joint space narrowing with outpouching of the medial meniscus. Degenerative changes of the lateral meniscus appreciated.  Summary: Findings consistent with degenerative change of the joint space and medial meniscus.  Ultrasound and interpretation by Clearance Coots, MD    ASSESSMENT & PLAN:   Facet arthropathy, lumbosacral Acute on chronic in nature.  Has known degenerative changes from previous MRI. -Counseled on home exercise therapy and supportive care. -Norco. -Could consider facet injections.  Primary osteoarthritis of right knee Acutely occurring.  No significant effusion on exam and pain is on the medial joint space. - Counseled On home exercise therapy and supportive care. -Counseled on Voltaren. -Could consider physical therapy or injection.

## 2022-06-05 NOTE — Assessment & Plan Note (Signed)
Acutely occurring.  No significant effusion on exam and pain is on the medial joint space. - Counseled On home exercise therapy and supportive care. -Counseled on Voltaren. -Could consider physical therapy or injection.

## 2022-06-05 NOTE — Patient Instructions (Signed)
Good to see you Please use ice as needed  Please try the exercises  You can try voltaren over the counter gel  You can take colchicine until 2 days pain free   Please send me a message in MyChart with any questions or updates.  Please see me back as needed .   --Dr. Raeford Razor

## 2022-06-11 ENCOUNTER — Ambulatory Visit: Payer: Medicare Other | Admitting: Family Medicine

## 2022-06-19 ENCOUNTER — Ambulatory Visit (INDEPENDENT_AMBULATORY_CARE_PROVIDER_SITE_OTHER): Payer: Medicare Other | Admitting: Family Medicine

## 2022-06-19 ENCOUNTER — Encounter: Payer: Self-pay | Admitting: Family Medicine

## 2022-06-19 VITALS — BP 156/80 | HR 96 | Temp 98.2°F | Ht 73.0 in | Wt 228.6 lb

## 2022-06-19 DIAGNOSIS — I10 Essential (primary) hypertension: Secondary | ICD-10-CM

## 2022-06-19 DIAGNOSIS — M545 Low back pain, unspecified: Secondary | ICD-10-CM

## 2022-06-19 MED ORDER — TRAMADOL HCL 50 MG PO TABS: ORAL_TABLET | ORAL | 0 refills | Status: DC

## 2022-06-19 MED ORDER — CARVEDILOL 6.25 MG PO TABS: 6.2500 mg | ORAL_TABLET | Freq: Two times a day (BID) | ORAL | 3 refills | Status: DC

## 2022-06-19 NOTE — Progress Notes (Signed)
Established Patient Office Visit   Subjective:  Patient ID: Jason Valencia, male    DOB: Apr 13, 1950  Age: 72 y.o. MRN: [id number]  Chief Complaint  Patient presents with   Back Pain    Follow up  on back pains, per patient little improvement     HPI Encounter Diagnoses  Name Primary?   Essential hypertension Yes   Low back pain without sciatica, unspecified back pain laterality, unspecified chronicity    1 week history of nonradiating lower back pain status post dropping up to [place] and back.  No weakness numbness or tingling in his lower extremities.  No change in his bowel or bladder function.  Continues with amlodipine 10 mg and lisinopril/HCTZ 20/25 daily.  He has not been taking  carvedilol.   Review of Systems  Constitutional: Negative.   HENT: Negative.    Eyes:  Negative for blurred vision, discharge and redness.  Respiratory: Negative.    Cardiovascular: Negative.   Gastrointestinal:  Negative for abdominal pain.  Genitourinary: Negative.   Musculoskeletal:  Positive for back pain. Negative for myalgias.  Skin:  Negative for rash.  Neurological:  Negative for tingling, loss of consciousness and weakness.  Endo/Heme/Allergies:  Negative for polydipsia.     Current Outpatient Medications:    amLODipine (NORVASC) 10 MG tablet, TAKE 1 TABLET(10 MG) BY MOUTH DAILY, Disp: 90 tablet, Rfl: 3   ascorbic acid (VITAMIN C) 100 MG tablet, Take 1 tablet by mouth daily., Disp: , Rfl:    carvedilol (COREG) 6.25 MG tablet, Take 1 tablet (6.25 mg total) by mouth 2 (two) times daily with a meal., Disp: 60 tablet, Rfl: 3   Cholecalciferol (VITAMIN D-1000 MAX ST) 25 MCG (1000 UT) tablet, Take by mouth., Disp: , Rfl:    Coenzyme Q10 50 MG CAPS, Take 1 capsule by mouth daily., Disp: , Rfl:    colchicine 0.6 MG tablet, Take one twice daily for 7 days as needed for gouty attacks., Disp: 35 tablet, Rfl: 1   cyanocobalamin 100 MCG tablet, Take 1 tablet by mouth daily., Disp: , Rfl:     hydrOXYzine (VISTARIL) 25 MG capsule, TAKE 1 CAPSULE BY MOUTH EVERY NIGHT AT BEDTIME AS NEEDED FOR SLEEP, Disp: 30 capsule, Rfl: 0   ibuprofen (ADVIL) 800 MG tablet, Take one every 8 hours with food as needed., Disp: 90 tablet, Rfl: 0   lisinopril-hydrochlorothiazide (ZESTORETIC) 20-25 MG tablet, TAKE 1 TABLET BY MOUTH DAILY, Disp: 90 tablet, Rfl: 0   methocarbamol (ROBAXIN) 750 MG tablet, Take 1 tablet (750 mg total) by mouth 4 (four) times daily., Disp: 90 tablet, Rfl: 1   sildenafil (VIAGRA) 100 MG tablet, Take 0.5-1 tablets (50-100 mg total) by mouth daily as needed for erectile dysfunction., Disp: 5 tablet, Rfl: 11   traMADol (ULTRAM) 50 MG tablet, Take 1 at night as needed for pain., Disp: 15 tablet, Rfl: 0   Objective:     BP (!) 156/80 (BP Location: Right Arm, Patient Position: Sitting, Cuff Size: Large)   Pulse 96   Temp 98.2 F (36.8 C) (Temporal)   Ht 6\' 1"  (1.854 m)   Wt 228 lb 9.6 oz (103.7 kg)   SpO2 97%   BMI 30.16 kg/m  BP Readings from Last 3 Encounters:  06/19/22 (!) 156/80  06/05/22 130/76  04/16/22 (!) 148/86   Wt Readings from Last 3 Encounters:  06/19/22 228 lb 9.6 oz (103.7 kg)  06/05/22 216 lb (98 kg)  04/16/22 223 lb 6.4 oz (101.3 kg)  Physical Exam Constitutional:      General: He is not in acute distress.    Appearance: Normal appearance. He is not ill-appearing, toxic-appearing or diaphoretic.  HENT:     Head: Normocephalic and atraumatic.     Right Ear: External ear normal.     Left Ear: External ear normal.  Eyes:     General: No scleral icterus.       Right eye: No discharge.        Left eye: No discharge.     Extraocular Movements: Extraocular movements intact.     Conjunctiva/sclera: Conjunctivae normal.  Pulmonary:     Effort: Pulmonary effort is normal. No respiratory distress.  Musculoskeletal:     Lumbar back: No bony tenderness. Normal range of motion. Negative right straight leg raise test and negative left straight leg raise  test.  Skin:    General: Skin is warm and dry.  Neurological:     Mental Status: He is alert and oriented to person, place, and time.     Motor: No weakness.     Deep Tendon Reflexes:     Reflex Scores:      Patellar reflexes are 1+ on the right side and 1+ on the left side.      Achilles reflexes are 1+ on the right side and 1+ on the left side. Psychiatric:        Mood and Affect: Mood normal.        Behavior: Behavior normal.      No results found for any visits on 06/19/22.    The 10-year ASCVD risk score (Arnett DK, et al., 2019) is: 24.2%    Assessment & Plan:   Essential hypertension -     Carvedilol; Take 1 tablet (6.25 mg total) by mouth 2 (two) times daily with a meal.  Dispense: 60 tablet; Refill: 3  Low back pain without sciatica, unspecified back pain laterality, unspecified chronicity -     traMADol HCl; Take 1 at night as needed for pain.  Dispense: 15 tablet; Refill: 0    Return in about 4 weeks (around 07/17/2022), or Continue amlodipine and lisinopril with HCTZ.Libby Maw, MD

## 2022-07-01 ENCOUNTER — Telehealth: Payer: Self-pay | Admitting: Family Medicine

## 2022-07-01 NOTE — Telephone Encounter (Signed)
Patient is requesting a copy of labs collected by PCP from 06/2019 to 03/2021.

## 2022-07-03 NOTE — Telephone Encounter (Signed)
Requested labs printed off, called patient to inform no answer unable to LM. Mailed labs to address on file.

## 2022-07-10 NOTE — Telephone Encounter (Signed)
Pt called to request his labs mailed to him from the years 2020 to 2023. He received 2024 but needs the others. [phone]

## 2022-07-14 ENCOUNTER — Encounter: Payer: Self-pay | Admitting: *Deleted

## 2022-07-15 NOTE — Telephone Encounter (Signed)
LMOM informing Pt that I am mailing requested labs to him. Instructed to call office if questions/concerns.

## 2022-07-23 ENCOUNTER — Other Ambulatory Visit: Payer: Self-pay | Admitting: Family Medicine

## 2022-07-23 DIAGNOSIS — I10 Essential (primary) hypertension: Secondary | ICD-10-CM

## 2022-07-24 ENCOUNTER — Other Ambulatory Visit: Payer: Self-pay

## 2022-07-24 ENCOUNTER — Ambulatory Visit: Payer: Medicare Other | Admitting: Family Medicine

## 2022-07-24 VITALS — BP 146/82 | Ht 72.0 in | Wt 216.0 lb

## 2022-07-24 DIAGNOSIS — M1711 Unilateral primary osteoarthritis, right knee: Secondary | ICD-10-CM | POA: Diagnosis not present

## 2022-07-24 MED ORDER — COLCHICINE 0.6 MG PO TABS: 0.6000 mg | ORAL_TABLET | Freq: Two times a day (BID) | ORAL | 2 refills | Status: DC

## 2022-07-24 MED ORDER — TRIAMCINOLONE ACETONIDE 40 MG/ML IJ SUSP
40.0000 mg | Freq: Once | INTRAMUSCULAR | Status: AC
  Administered 2022-07-24: 40 mg via INTRA_ARTICULAR

## 2022-07-24 NOTE — Assessment & Plan Note (Signed)
Acute on chronic in nature.  Has an effusion on exam. -Counseled on home exercise therapy and supportive care. -Injection today. -Could consider PRP or gel injection

## 2022-07-24 NOTE — Progress Notes (Signed)
  Jason Valencia - 72 y.o. male MRN [id number]  Date of birth: 1950-09-24  SUBJECTIVE:  Including CC & ROS.  No chief complaint on file.   Jason Valencia is a 72 y.o. male that is presenting with acute on chronic right knee pain.  The pain is occurring over the medial joint space.  No injury or inciting event.   Review of Systems See HPI   HISTORY: Past Medical, Surgical, Social, and Family History Reviewed & Updated per EMR.   Pertinent Historical Findings include:  Past Medical History:  Diagnosis Date   Cancer (HCC)    prostate cancer- 2011   Hypertension     Past Surgical History:  Procedure Laterality Date   COLONOSCOPY     KNEE SURGERY     PROSTATE SURGERY     ROTATOR CUFF REPAIR     left     PHYSICAL EXAM:  VS: BP (!) 146/82   Ht 6' (1.829 m)   Wt 216 lb (98 kg)   BMI 29.29 kg/m  Physical Exam Gen: NAD, alert, cooperative with exam, well-appearing MSK:  Neurovascularly intact     Aspiration/Injection Procedure Note Jason Valencia Nov 17, 1950  Procedure: Injection Indications: Right knee pain  Procedure Details Consent: Risks of procedure as well as the alternatives and risks of each were explained to the (patient/caregiver).  Consent for procedure obtained. Time Out: Verified patient identification, verified procedure, site/side was marked, verified correct patient position, special equipment/implants available, medications/allergies/relevent history reviewed, required imaging and test results available.  Performed.  The area was cleaned with iodine and alcohol swabs.    The right knee superior lateral suprapatellar pouch was injected using 4 cc of 1% lidocaine and 0.4 cc of 8.4% sodium bicarbonate on a 22-gauge 1-1/2 inch needle.  The syringe was switched to mixture containing 1 cc's of 40 mg Kenalog and 4 cc's of 0.25% bupivacaine was injected.  Ultrasound was used. Images were obtained in long views showing the injection.     A sterile dressing was  applied.  Patient did tolerate procedure well.     ASSESSMENT & PLAN:   Primary osteoarthritis of right knee Acute on chronic in nature.  Has an effusion on exam. -Counseled on home exercise therapy and supportive care. -Injection today. -Could consider PRP or gel injection

## 2022-07-24 NOTE — Patient Instructions (Signed)
Good to see you Please use ice as needed   Please send me a message in MyChart with any questions or updates.  Please see me back as needed.   --Dr. Damen Windsor  

## 2022-07-30 ENCOUNTER — Telehealth: Payer: Self-pay | Admitting: Family Medicine

## 2022-07-30 ENCOUNTER — Ambulatory Visit: Payer: Medicare Other | Admitting: Family Medicine

## 2022-07-30 NOTE — Telephone Encounter (Signed)
Pt called to reschedule due to hurting his back and in and pain. Letter sent Appt5/2

## 2022-08-04 ENCOUNTER — Ambulatory Visit: Payer: Medicare Other | Admitting: Family Medicine

## 2022-08-04 ENCOUNTER — Telehealth: Payer: Self-pay | Admitting: Family Medicine

## 2022-08-04 NOTE — Telephone Encounter (Signed)
Pt was a no show for an OV with Dr Doreene Burke on 08/04/22, I sent a no show letter. He showed up 15 minutes past his appointment.

## 2022-08-07 ENCOUNTER — Encounter: Payer: Self-pay | Admitting: Family Medicine

## 2022-08-07 ENCOUNTER — Ambulatory Visit (INDEPENDENT_AMBULATORY_CARE_PROVIDER_SITE_OTHER): Payer: Medicare Other | Admitting: Family Medicine

## 2022-08-07 VITALS — BP 156/93 | HR 70 | Temp 97.7°F | Ht 72.0 in | Wt 228.2 lb

## 2022-08-07 DIAGNOSIS — E669 Obesity, unspecified: Secondary | ICD-10-CM

## 2022-08-07 DIAGNOSIS — R7303 Prediabetes: Secondary | ICD-10-CM

## 2022-08-07 DIAGNOSIS — I10 Essential (primary) hypertension: Secondary | ICD-10-CM | POA: Diagnosis not present

## 2022-08-07 DIAGNOSIS — D2361 Other benign neoplasm of skin of right upper limb, including shoulder: Secondary | ICD-10-CM | POA: Insufficient documentation

## 2022-08-07 NOTE — Progress Notes (Addendum)
Established Patient Office Visit   Subjective:  Patient ID: Jason Valencia, male    DOB: 1950/07/22  Age: 72 y.o. MRN: [id number]  Chief Complaint  Patient presents with   Medical Management of Chronic Issues    4 week follow up, check black spot on right arm.     HPI Encounter Diagnoses  Name Primary?   Essential hypertension Yes   Dermatofibroma of upper arm, right    Prediabetes    Obesity (BMI 30-39.9)    [facility] pressures been running in the 120s over 70s.  He never started the carvedilol.   Review of Systems  Constitutional: Negative.   HENT: Negative.    Eyes:  Negative for blurred vision, discharge and redness.  Respiratory: Negative.    Cardiovascular: Negative.   Gastrointestinal:  Negative for abdominal pain.  Genitourinary: Negative.   Musculoskeletal: Negative.  Negative for myalgias.  Skin:  Negative for rash.  Neurological:  Negative for tingling, loss of consciousness and weakness.  Endo/Heme/Allergies:  Negative for polydipsia.     Current Outpatient Medications:    amLODipine (NORVASC) 10 MG tablet, TAKE 1 TABLET(10 MG) BY MOUTH DAILY, Disp: 90 tablet, Rfl: 3   ascorbic acid (VITAMIN C) 100 MG tablet, Take 1 tablet by mouth daily., Disp: , Rfl:    Cholecalciferol (VITAMIN D-1000 MAX ST) 25 MCG (1000 UT) tablet, Take by mouth., Disp: , Rfl:    Coenzyme Q10 50 MG CAPS, Take 1 capsule by mouth daily., Disp: , Rfl:    colchicine 0.6 MG tablet, Take 1 tablet (0.6 mg total) by mouth 2 (two) times daily., Disp: 60 tablet, Rfl: 2   cyanocobalamin 100 MCG tablet, Take 1 tablet by mouth daily., Disp: , Rfl:    hydrOXYzine (VISTARIL) 25 MG capsule, TAKE 1 CAPSULE BY MOUTH EVERY NIGHT AT BEDTIME AS NEEDED FOR SLEEP, Disp: 30 capsule, Rfl: 0   ibuprofen (ADVIL) 800 MG tablet, Take one every 8 hours with food as needed., Disp: 90 tablet, Rfl: 0   methocarbamol (ROBAXIN) 750 MG tablet, Take 1 tablet (750 mg  total) by mouth 4 (four) times daily., Disp: 90 tablet, Rfl: 1   carvedilol (COREG) 6.25 MG tablet, Take 1 tablet (6.25 mg total) by mouth 2 (two) times daily with a meal. (Patient not taking: Reported on 08/07/2022), Disp: 60 tablet, Rfl: 3   cyclobenzaprine (FLEXERIL) 10 MG tablet, Take 1 tablet (10 mg total) by mouth 3 (three) times daily as needed., Disp: 90 tablet, Rfl: 1   HYDROcodone-acetaminophen (NORCO/VICODIN) 5-325 MG tablet, Take 1 tablet by mouth every 8 (eight) hours as needed., Disp: 15 tablet, Rfl: 0   lisinopril-hydrochlorothiazide (ZESTORETIC) 20-25 MG tablet, TAKE 1 TABLET BY MOUTH DAILY, Disp: 90 tablet, Rfl: 0   sildenafil (VIAGRA) 100 MG tablet, TAKE 1/2 TO 1 TABLET(50 TO 100 MG) BY MOUTH DAILY AS NEEDED FOR ERECTILE DYSFUNCTION, Disp: 5 tablet, Rfl: 11   Objective:     BP (!) 156/93 (BP Location: Left Arm, Patient Position: Sitting, Cuff Size: Large)   Pulse 70   Temp 97.7 F (36.5 C) (Temporal)   Ht 6' (1.829 m)   Wt 228 lb 3.2 oz (103.5 kg)   SpO2 98%   BMI 30.95 kg/m  BP Readings from Last 3 Encounters:  08/07/22 (!) 156/93  07/24/22 (!) 146/82  06/19/22 (!) 156/80   Wt Readings from Last 3 Encounters:  08/20/22 228 lb (103.4 kg)  08/07/22  228 lb 3.2 oz (103.5 kg)  07/24/22 216 lb (98 kg)      Physical Exam Constitutional:      General: He is not in acute distress.    Appearance: Normal appearance. He is not ill-appearing, toxic-appearing or diaphoretic.  HENT:     Head: Normocephalic and atraumatic.     Right Ear: External ear normal.     Left Ear: External ear normal.  Eyes:     General: No scleral icterus.       Right eye: No discharge.        Left eye: No discharge.     Extraocular Movements: Extraocular movements intact.     Conjunctiva/sclera: Conjunctivae normal.  Pulmonary:     Effort: Pulmonary effort is normal. No respiratory distress.  Skin:    General: Skin is warm and dry.     Comments: Posterior right brachium is a 3 mm fibrous  hyperpigmented papule sharply delineated from the surrounding tissue.  Neurological:     Mental Status: He is alert and oriented to person, place, and time.  Psychiatric:        Mood and Affect: Mood normal.        Behavior: Behavior normal.      Results for orders placed or performed in visit on 08/07/22  Basic metabolic panel  Result Value Ref Range   Glucose, Bld 110 (H) 65 - 99 mg/dL   BUN 14 7 - 25 mg/dL   Creat 4.09 8.11 - 9.14 mg/dL   BUN/Creatinine Ratio SEE NOTE: 6 - 22 (calc)   Sodium 137 135 - 146 mmol/L   Potassium 3.5 3.5 - 5.3 mmol/L   Chloride 101 98 - 110 mmol/L   CO2 27 20 - 32 mmol/L   Calcium 9.6 8.6 - 10.3 mg/dL  Hemoglobin N8G  Result Value Ref Range   Hgb A1c MFr Bld 5.8 (H) <5.7 % of total Hgb   Mean Plasma Glucose 120 mg/dL   eAG (mmol/L) 6.6 mmol/L      The 10-year ASCVD risk score (Arnett DK, et al., 2019) is: 23.7%    Assessment & Plan:   Essential hypertension -     Basic metabolic panel  Dermatofibroma of upper arm, right  Prediabetes -     Basic metabolic panel -     Hemoglobin A1c  Obesity (BMI 30-39.9) -     Amb ref to Medical Nutrition Therapy-MNT    Return in 3 months (on 11/07/2022), or Purchase arm cuff.  Periodically check blood pressures and return to clinic with cuff..  Patient would like to lose weight and have his hemoglobin A1c rechecked.  Information was given on preventing type 2 diabetes.  Information was given on how to take blood pressure.   Mliss Sax, MD  7/24 allscripts with compliance concern.

## 2022-08-08 LAB — HEMOGLOBIN A1C
Hgb A1c MFr Bld: 5.8 % of total Hgb — ABNORMAL HIGH (ref <5.7)
Mean Plasma Glucose: 120 mg/dL
eAG (mmol/L): 6.6 mmol/L

## 2022-08-08 LAB — BASIC METABOLIC PANEL
BUN: 14 mg/dL (ref 7–25)
CO2: 27 mmol/L (ref 20–32)
Calcium: 9.6 mg/dL (ref 8.6–10.3)
Chloride: 101 mmol/L (ref 98–110)
Creat: 0.94 mg/dL (ref 0.70–1.28)
Glucose, Bld: 110 mg/dL — ABNORMAL HIGH (ref 65–99)
Potassium: 3.5 mmol/L (ref 3.5–5.3)
Sodium: 137 mmol/L (ref 135–146)

## 2022-08-11 ENCOUNTER — Ambulatory Visit: Payer: Medicare Other | Admitting: Family Medicine

## 2022-08-17 ENCOUNTER — Telehealth: Payer: Self-pay | Admitting: Family Medicine

## 2022-08-17 NOTE — Telephone Encounter (Signed)
Returned patient call at 4:36 no answer went straight to [facility]

## 2022-08-17 NOTE — Telephone Encounter (Signed)
Patient called today at 12:38 . I informed the patient you was at lunch so the patient said he will call you back at 1:30

## 2022-08-17 NOTE — Telephone Encounter (Signed)
Pt is wanting a cb at [phone] between 4:15-5:00. This is when he will get out of his meeting.

## 2022-08-18 DIAGNOSIS — N393 Stress incontinence (female) (male): Secondary | ICD-10-CM | POA: Diagnosis not present

## 2022-08-18 DIAGNOSIS — N529 Male erectile dysfunction, unspecified: Secondary | ICD-10-CM | POA: Diagnosis not present

## 2022-08-18 DIAGNOSIS — C61 Malignant neoplasm of prostate: Secondary | ICD-10-CM | POA: Diagnosis not present

## 2022-08-18 NOTE — Telephone Encounter (Signed)
Returned patients call no answer LMTCB 

## 2022-08-20 ENCOUNTER — Ambulatory Visit: Payer: Medicare Other | Admitting: Family Medicine

## 2022-08-20 ENCOUNTER — Encounter: Payer: Self-pay | Admitting: Family Medicine

## 2022-08-20 ENCOUNTER — Other Ambulatory Visit: Payer: Self-pay

## 2022-08-20 VITALS — Ht 72.0 in | Wt 228.0 lb

## 2022-08-20 DIAGNOSIS — M65332 Trigger finger, left middle finger: Secondary | ICD-10-CM | POA: Diagnosis not present

## 2022-08-20 DIAGNOSIS — M1711 Unilateral primary osteoarthritis, right knee: Secondary | ICD-10-CM

## 2022-08-20 MED ORDER — TRIAMCINOLONE ACETONIDE 40 MG/ML IJ SUSP
40.0000 mg | Freq: Once | INTRAMUSCULAR | Status: AC
Start: 2022-08-20 — End: 2022-08-20
  Administered 2022-08-20: 40 mg via INTRA_ARTICULAR

## 2022-08-20 NOTE — Assessment & Plan Note (Signed)
Doing well with the injection with no pain today. -Counseled on home exercise therapy and supportive care. - Could consider gel injection or physical therapy

## 2022-08-20 NOTE — Assessment & Plan Note (Signed)
Acutely occurring.  Symptoms more consistent with trigger finger as opposed to his gout.  No improvement with recent colchicine. -Counseled on home exercise therapy and supportive care. -Injection today. -counseled on splinting. -Could consider further imaging

## 2022-08-20 NOTE — Patient Instructions (Signed)
Good to see you Please use ice as needed on the knee  Please try buddy taping the right finger at night and try the splint on the left at night. You can do this for three weeks.  You can stop the colchicine and see if there is a difference  Please send me a message in MyChart with any questions or updates.  Please see the clinic back as needed.   --Dr. Jordan Likes

## 2022-08-20 NOTE — Progress Notes (Signed)
  Jason Valencia - 72 y.o. male MRN [id number]  Date of birth: 10-27-1950  SUBJECTIVE:  Including CC & ROS.  No chief complaint on file.   Jason Valencia is a 72 y.o. male that is following up for his right knee and presenting with right and left middle finger pain.  He has been taking colchicine with no improvement of his middle finger pain.  He does appear to be more painful in the morning.  The pain has improved since the injection.    Review of Systems See HPI   HISTORY: Past Medical, Surgical, Social, and Family History Reviewed & Updated per EMR.   Pertinent Historical Findings include:  Past Medical History:  Diagnosis Date   Cancer (HCC)    prostate cancer- 2011   Hypertension     Past Surgical History:  Procedure Laterality Date   COLONOSCOPY     KNEE SURGERY     PROSTATE SURGERY     ROTATOR CUFF REPAIR     left     PHYSICAL EXAM:  VS: Ht 6' (1.829 m)   Wt 228 lb (103.4 kg)   BMI 30.92 kg/m  Physical Exam Gen: NAD, alert, cooperative with exam, well-appearing MSK:  Neurovascularly intact    Limited ultrasound: Left middle finger pain:  There appears to be nodules within the volar surface of the flexor tendon  Summary: Finding consistent with trigger finger  Ultrasound and interpretation by Clare Gandy, MD   Aspiration/Injection Procedure Note Arbie Cookey Estelle [birth year]  Procedure: Injection Indications: Left middle finger pain  Procedure Details Consent: Risks of procedure as well as the alternatives and risks of each were explained to the (patient/caregiver).  Consent for procedure obtained. Time Out: Verified patient identification, verified procedure, site/side was marked, verified correct patient position, special equipment/implants available, medications/allergies/relevent history reviewed, required imaging and test results available.  Performed.  The area was cleaned with iodine and alcohol swabs.    The left trigger finger middle finger  was injected using 1 cc's of 40 mg Kenalog and 1 cc's of 0.25% bupivacaine was injected.  Ultrasound was used. Images were obtained in long views showing the injection.     A sterile dressing was applied.  Patient did tolerate procedure well.    ASSESSMENT & PLAN:   Trigger middle finger of left hand Acutely occurring.  Symptoms more consistent with trigger finger as opposed to his gout.  No improvement with recent colchicine. -Counseled on home exercise therapy and supportive care. -Injection today. -counseled on splinting. -Could consider further imaging  Primary osteoarthritis of right knee Doing well with the injection with no pain today. -Counseled on home exercise therapy and supportive care. - Could consider gel injection or physical therapy

## 2022-08-21 MED ORDER — CYCLOBENZAPRINE HCL 10 MG PO TABS: 10.0000 mg | ORAL_TABLET | Freq: Three times a day (TID) | ORAL | 1 refills | Status: AC | PRN

## 2022-08-21 MED ORDER — HYDROCODONE-ACETAMINOPHEN 5-325 MG PO TABS: 1.0000 | ORAL_TABLET | Freq: Three times a day (TID) | ORAL | 0 refills | Status: DC | PRN

## 2022-08-21 NOTE — Addendum Note (Signed)
Addended by: Clare Gandy E on: 08/21/2022 11:40 AM   Modules accepted: Orders

## 2022-08-21 NOTE — Telephone Encounter (Signed)
arrived 15 min late for visit, rescheduled for 08/07/2022, 3rd missed visit, final warning sent via mail & text   04/07/2022 no show 07/30/2022 same day cancel, back pain 08/04/2022 arrived 15 min late

## 2022-09-06 ENCOUNTER — Other Ambulatory Visit: Payer: Self-pay | Admitting: Family Medicine

## 2022-09-06 DIAGNOSIS — I10 Essential (primary) hypertension: Secondary | ICD-10-CM

## 2022-09-14 ENCOUNTER — Telehealth: Payer: Self-pay | Admitting: Family Medicine

## 2022-09-14 NOTE — Telephone Encounter (Signed)
Received call from [place] with [facility] requesting medical information for [phone]. She noted that pt called and requested to initiate authorization. Advised that this medication has not been prescribed to the patient.   Jason Valencia to call pt back regarding request.

## 2022-09-17 ENCOUNTER — Telehealth: Payer: Self-pay | Admitting: Family Medicine

## 2022-09-17 NOTE — Telephone Encounter (Signed)
PT would like for you to give him a call. Pt said he has a question for you

## 2022-09-18 NOTE — Telephone Encounter (Signed)
Morrie Sheldon call pt back and he is now wanting a cb concerning this issue. Please advise pt at [phone]

## 2022-09-22 NOTE — Telephone Encounter (Signed)
Returned patients call regarding Wegovy no answer LMTCB. They are no prescriptions for [facility] that have been prescribed by Dr. Doreene Burke.

## 2022-09-22 NOTE — Telephone Encounter (Signed)
I read pt the message below, he still wants a cb.

## 2022-09-22 NOTE — Telephone Encounter (Signed)
Patient would like a prescription for wegovy to help with weight loss and feel this will also help control A1c levels. Last labs Dr. Doreene Burke recommended patient to start on Metformin patient did not want to try this due to research that he read about to many side effects so patient would like a prescription to try [facility]. Please advise.

## 2022-09-22 NOTE — Telephone Encounter (Signed)
Spoke with patient who states that he had a conversation with Dr. Doreene Burke at his last visit asking if he would write a prescription for [facility] for prediabetes and to help with weight management patient had reached out to his insurance asking if they would cover this prior to any prescriptions being sent it. Patient calling today to see if a Rx could be sent in? Please advise.

## 2022-09-23 ENCOUNTER — Other Ambulatory Visit: Payer: Self-pay | Admitting: Family Medicine

## 2022-09-23 DIAGNOSIS — N5231 Erectile dysfunction following radical prostatectomy: Secondary | ICD-10-CM

## 2022-10-06 ENCOUNTER — Telehealth: Payer: Self-pay | Admitting: Family Medicine

## 2022-10-06 NOTE — Telephone Encounter (Signed)
Pt called and said he is returning your call about doing a mychart video and a code he need

## 2022-10-09 IMAGING — DX DG SHOULDER 2+V*L*
3 series · 3 of 3 positions shown · non-contrast
Comparison: 01/13/2019

CLINICAL DATA: Left shoulder pain

EXAM:
LEFT SHOULDER - 2+ VIEW

[shoulder grashey]
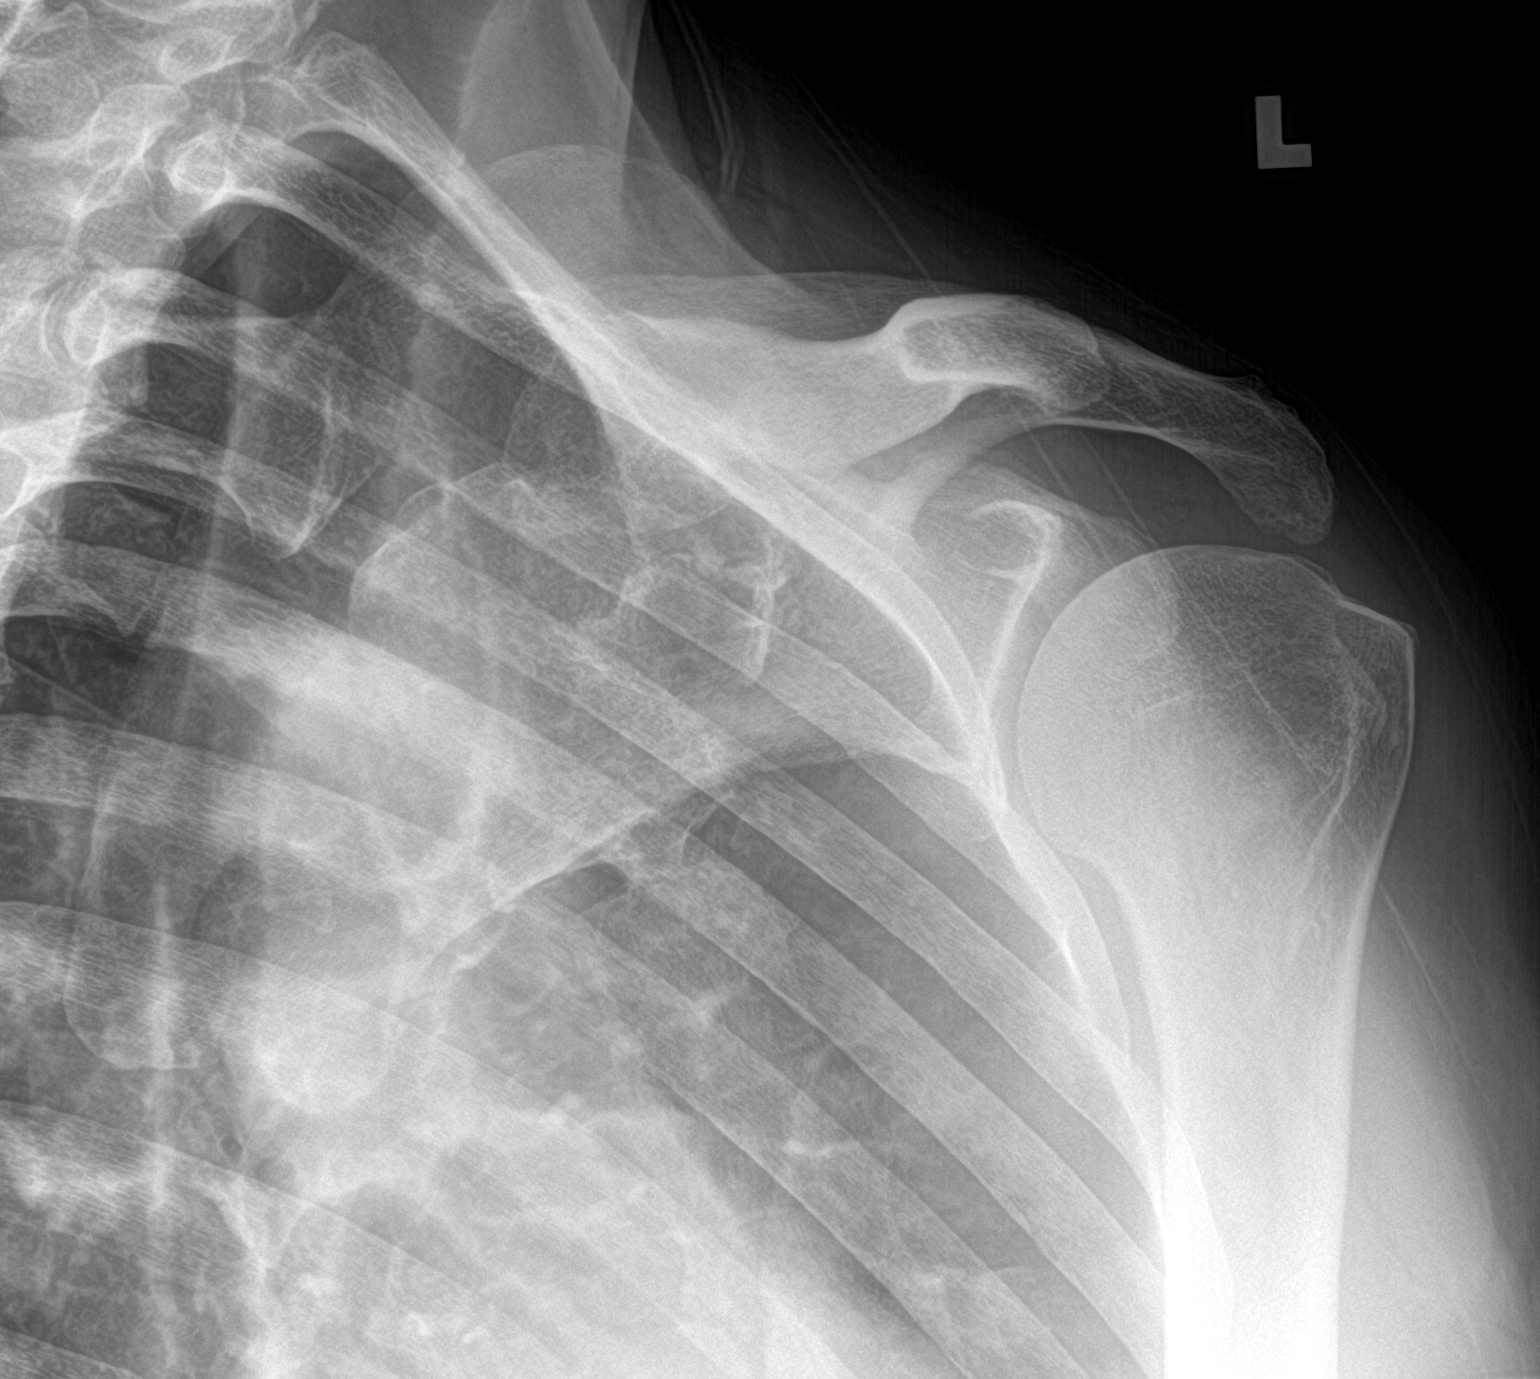

[shoulder y view]
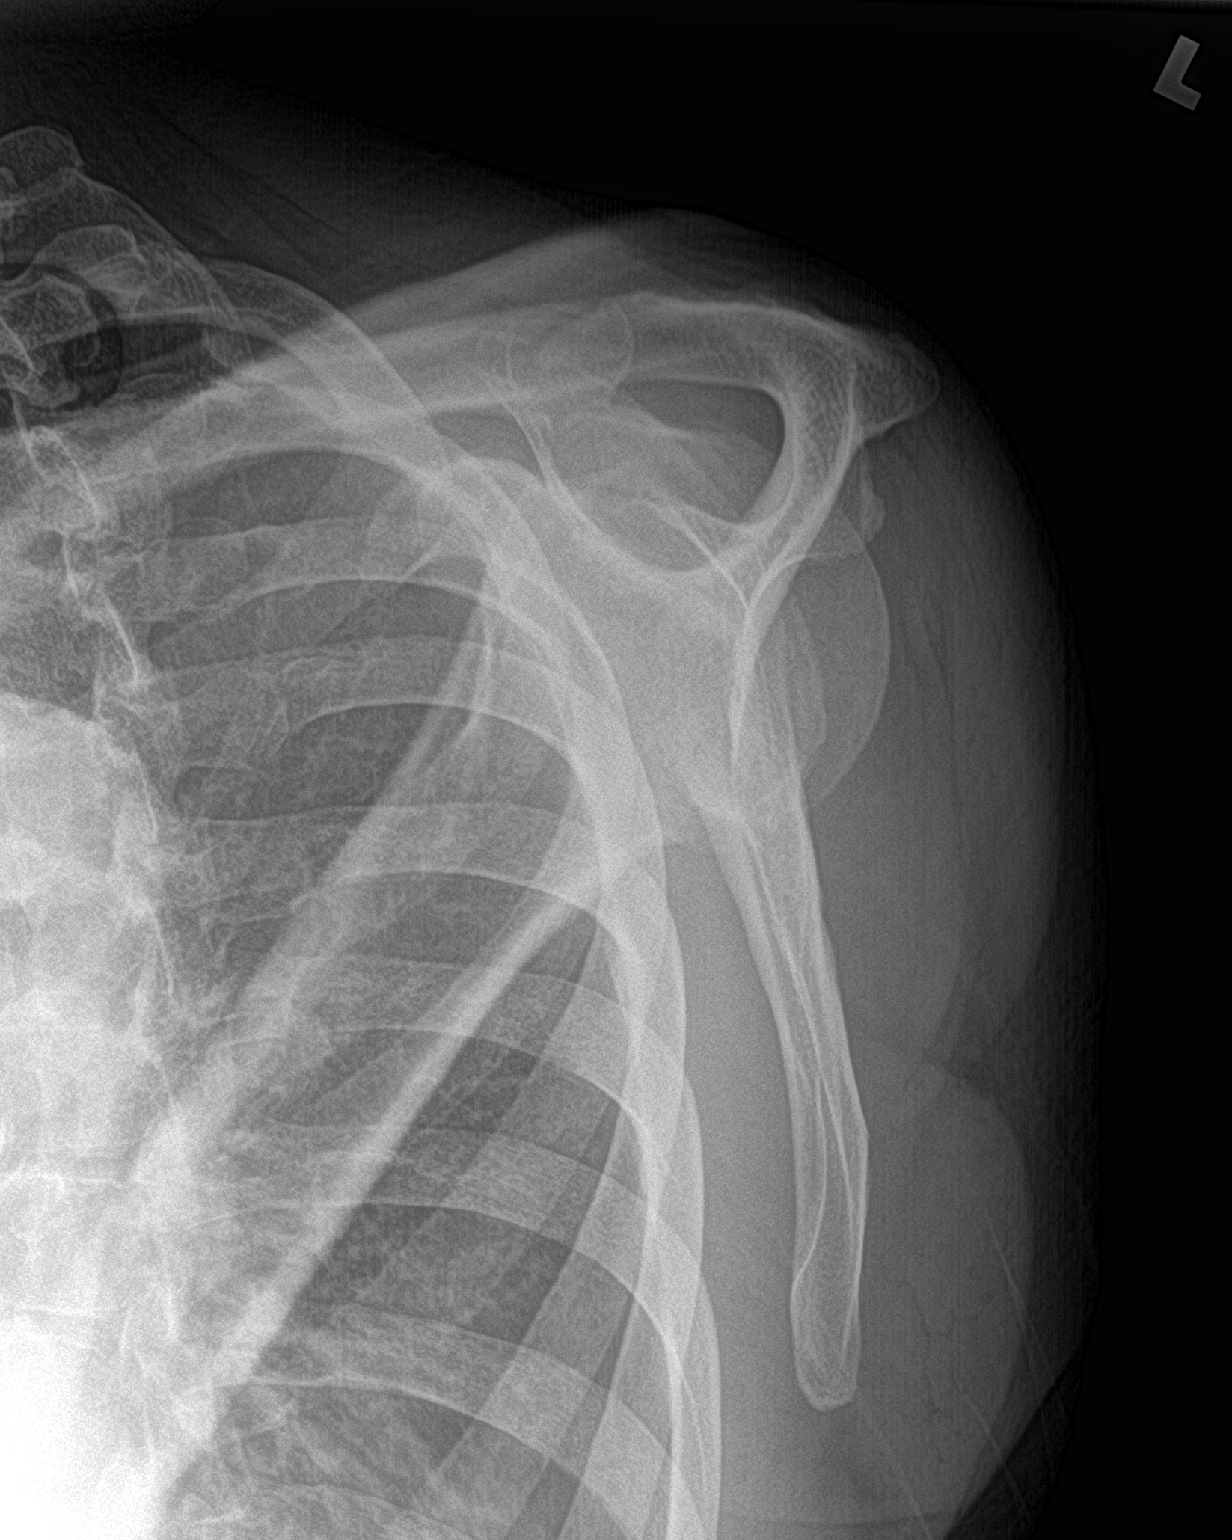

[shoulder axillary]
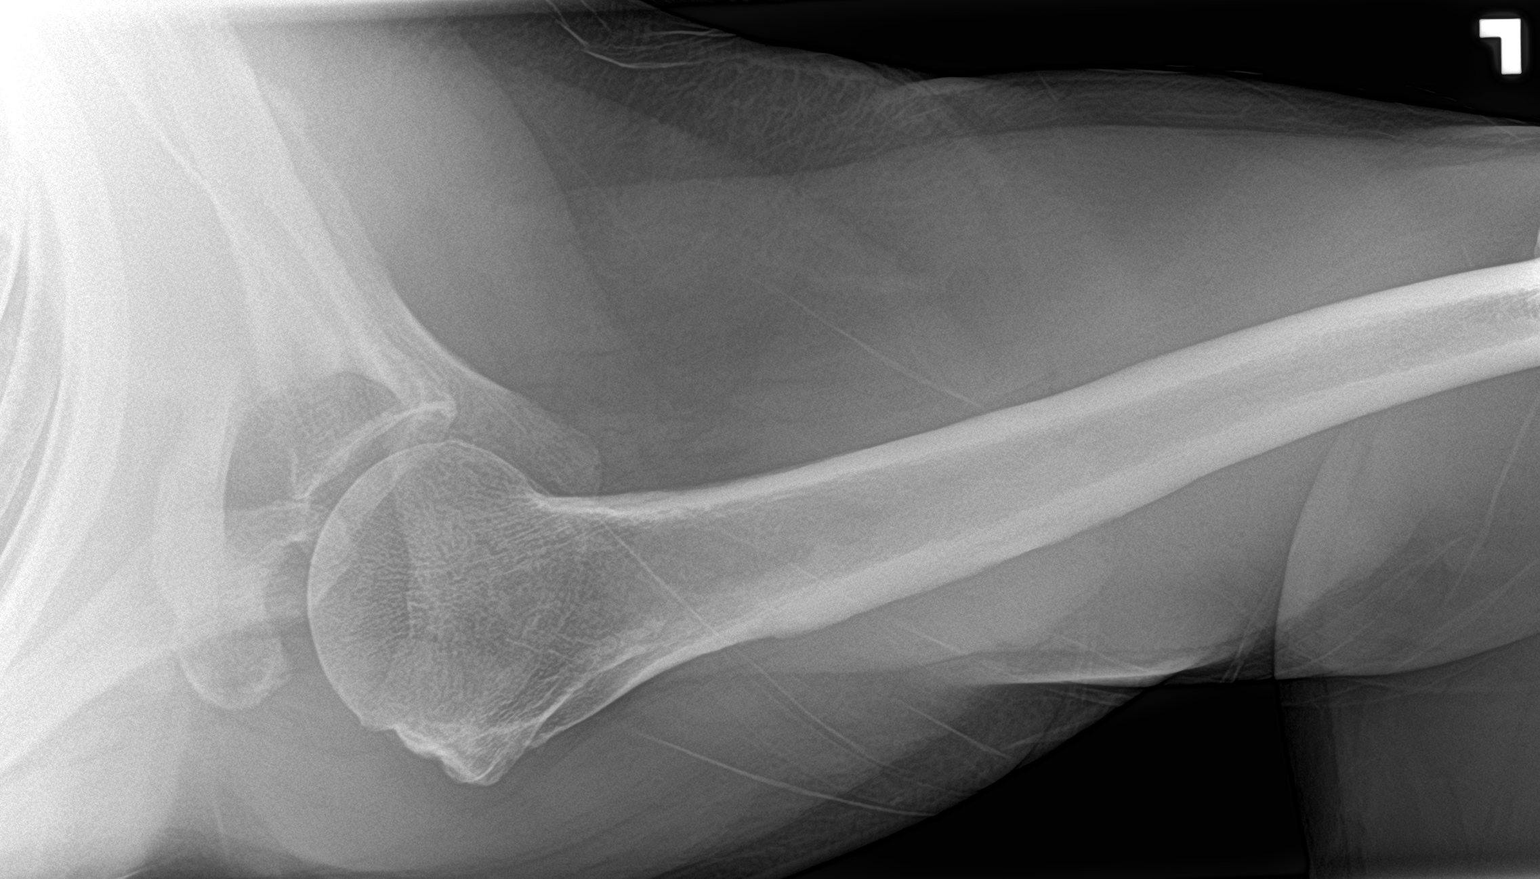

[3 of 3 positions shown; findings below may reference images not displayed]

FINDINGS: There is no evidence of fracture or dislocation. Previously noted
distal clavicular resorption not well demonstrated on the current
images. The left lung apex is clear
IMPRESSION: Negative.

## 2022-10-27 DIAGNOSIS — N529 Male erectile dysfunction, unspecified: Secondary | ICD-10-CM | POA: Diagnosis not present

## 2022-12-28 ENCOUNTER — Ambulatory Visit (INDEPENDENT_AMBULATORY_CARE_PROVIDER_SITE_OTHER): Payer: Medicare Other

## 2022-12-28 DIAGNOSIS — Z0000 Encounter for general adult medical examination without abnormal findings: Secondary | ICD-10-CM | POA: Diagnosis not present

## 2022-12-28 NOTE — Patient Instructions (Signed)
Jason Valencia , Thank you for taking time to come for your Medicare Wellness Visit. I appreciate your ongoing commitment to your health goals. Please review the following plan we discussed and let me know if I can assist you in the future.   Referrals/Orders/Follow-Ups/Clinician Recommendations: none  This is a list of the screening recommended for you and due dates:  Health Maintenance  Topic Date Due   Pneumonia Vaccine (2 of 2 - PCV) 12/16/2019   Flu Shot  10/29/2022   DTaP/Tdap/Td vaccine (2 - Tdap) 02/14/2023   Medicare Annual Wellness Visit  12/28/2023   Colon Cancer Screening  11/22/2025   Hepatitis C Screening  Completed   Zoster (Shingles) Vaccine  Completed   HPV Vaccine  Aged Out   COVID-19 Vaccine  Discontinued    Advanced directives: (ACP Link)Information on Advanced Care Planning can be found at [facility] [place] Advance Health Care Directives Advance Health Care Directives ([email or web address]   Next Medicare Annual Wellness Visit scheduled for next year: Yes Managing Pain Without Opioids Opioids are strong medicines used to treat moderate to severe pain. For some people, especially those who have long-term (chronic) pain, opioids may not be the best choice for pain management due to: Side effects like nausea, constipation, and sleepiness. The risk of addiction (opioid use disorder). The longer you take opioids, the greater your risk of addiction. Pain that lasts for more than 3 months is called chronic pain. Managing chronic pain usually requires more than one approach and is often provided by a team of health care providers working together (multidisciplinary approach). Pain management may be done at a pain management center or pain clinic. How to manage pain without the use of opioids Use non-opioid medicines Non-opioid medicines for pain may include: Over-the-counter or prescription non-steroidal anti-inflammatory drugs (NSAIDs). These may be the first medicines  used for pain. They work well for muscle and bone pain, and they reduce swelling. Acetaminophen. This over-the-counter medicine may work well for milder pain but not swelling. Antidepressants. These may be used to treat chronic pain. A certain type of antidepressant (tricyclics) is often used. These medicines are given in lower doses for pain than when used for depression. Anticonvulsants. These are usually used to treat seizures but may also reduce nerve (neuropathic) pain. Muscle relaxants. These relieve pain caused by sudden muscle tightening (spasms). You may also use a pain medicine that is applied to the skin as a patch, cream, or gel (topical analgesic), such as a numbing medicine. These may cause fewer side effects than medicines taken by mouth. Do certain therapies as directed Some therapies can help with pain management. They include: Physical therapy. You will do exercises to gain strength and flexibility. A physical therapist may teach you exercises to move and stretch parts of your body that are weak, stiff, or painful. You can learn these exercises at physical therapy visits and practice them at home. Physical therapy may also involve: Massage. Heat wraps or applying heat or cold to affected areas. Electrical signals that interrupt pain signals (transcutaneous electrical nerve stimulation, TENS). Weak lasers that reduce pain and swelling (low-level laser therapy). Signals from your body that help you learn to regulate pain (biofeedback). Occupational therapy. This helps you to learn ways to function at home and work with less pain. Recreational therapy. This involves trying new activities or hobbies, such as a physical activity or drawing. Mental health therapy, including: Cognitive behavioral therapy (CBT). This helps you learn coping skills for dealing  with pain. Acceptance and commitment therapy (ACT) to change the way you think and react to pain. Relaxation therapies, including  muscle relaxation exercises and mindfulness-based stress reduction. Pain management counseling. This may be individual, family, or group counseling.  Receive medical treatments Medical treatments for pain management include: Nerve block injections. These may include a pain blocker and anti-inflammatory medicines. You may have injections: Near the spine to relieve chronic back or neck pain. Into joints to relieve back or joint pain. Into nerve areas that supply a painful area to relieve body pain. Into muscles (trigger point injections) to relieve some painful muscle conditions. A medical device placed near your spine to help block pain signals and relieve nerve pain or chronic back pain (spinal cord stimulation device). Acupuncture. Follow these instructions at home Medicines Take over-the-counter and prescription medicines only as told by your health care provider. If you are taking pain medicine, ask your health care providers about possible side effects to watch out for. Do not drive or use heavy machinery while taking prescription opioid pain medicine. Lifestyle  Do not use drugs or alcohol to reduce pain. If you drink alcohol, limit how much you have to: 0-1 drink a day for women who are not pregnant. 0-2 drinks a day for men. Know how much alcohol is in a drink. In the [place] one drink equals one 12 oz bottle of beer (355 mL), one 5 oz glass of wine (148 mL), or one 1 oz glass of hard liquor (44 mL). Do not use any products that contain nicotine or tobacco. These products include cigarettes, chewing tobacco, and vaping devices, such as e-cigarettes. If you need help quitting, ask your health care provider. Eat a healthy diet and maintain a healthy weight. Poor diet and excess weight may make pain worse. Eat foods that are high in fiber. These include fresh fruits and vegetables, whole grains, and beans. Limit foods that are high in fat and processed sugars, such as fried and sweet  foods. Exercise regularly. Exercise lowers stress and may help relieve pain. Ask your health care provider what activities and exercises are safe for you. If your health care provider approves, join an exercise class that combines movement and stress reduction. Examples include yoga and tai chi. Get enough sleep. Lack of sleep may make pain worse. Lower stress as much as possible. Practice stress reduction techniques as told by your therapist. General instructions Work with all your pain management providers to find the treatments that work best for you. You are an important member of your pain management team. There are many things you can do to reduce pain on your own. Consider joining an online or in-person support group for people who have chronic pain. Keep all follow-up visits. This is important. Where to find more information You can find more information about managing pain without opioids from: American Academy of Pain Medicine: painmed.org Institute for Chronic Pain: instituteforchronicpain.org American Chronic Pain Association: theacpa.org Contact a health care provider if: You have side effects from pain medicine. Your pain gets worse or does not get better with treatments or home therapy. You are struggling with anxiety or depression. Summary Many types of pain can be managed without opioids. Chronic pain may respond better to pain management without opioids. Pain is best managed when you and a team of health care providers work together. Pain management without opioids may include non-opioid medicines, medical treatments, physical therapy, mental health therapy, and lifestyle changes. Tell your health care providers if  your pain gets worse or is not being managed well enough. This information is not intended to replace advice given to you by your health care provider. Make sure you discuss any questions you have with your health care provider. Document Revised: 06/26/2020  Document Reviewed: 06/26/2020 Elsevier Patient Education  2024 Elsevier Inc.   insert Preventive Care attachment Insert FALL PREVENTION attachment if needed

## 2022-12-28 NOTE — Progress Notes (Signed)
Subjective:   Jason Valencia is a 72 y.o. male who presents for Medicare Annual/Subsequent preventive examination.  Visit Complete: Virtual  I connected with  Jason Valencia on 12/28/22 by a audio enabled telemedicine application and verified that I am speaking with the correct person using two identifiers.  Patient Location: Home  Provider Location: Office/Clinic  I discussed the limitations of evaluation and management by telemedicine. The patient expressed understanding and agreed to proceed.  Because this visit was a virtual/telehealth visit, some criteria may be missing or patient reported. Any vitals not documented were not able to be obtained and vitals that have been documented are patient reported.   Cardiac Risk Factors include: advanced age (>67men, >29 women);hypertension;male gender     Objective:    Today's Vitals   There is no height or weight on file to calculate BMI.     12/28/2022   10:48 AM 12/23/2021   11:33 AM 10/04/2018   10:04 AM 06/22/2016   10:17 AM  Advanced Directives  Does Patient Have a Medical Advance Directive? No Yes No No  Type of Special educational needs teacher of [place];Living will    Copy of Healthcare Power of Attorney in Chart?  No - copy requested    Would patient like information on creating a medical advance directive?   No - Patient declined     Current Medications (verified) Outpatient Encounter Medications as of 12/28/2022  Medication Sig   amLODipine (NORVASC) 10 MG tablet TAKE 1 TABLET(10 MG) BY MOUTH DAILY   ascorbic acid (VITAMIN C) 100 MG tablet Take 1 tablet by mouth daily.   Cholecalciferol (VITAMIN D-1000 MAX ST) 25 MCG (1000 UT) tablet Take by mouth.   Coenzyme Q10 50 MG CAPS Take 1 capsule by mouth daily.   cyanocobalamin 100 MCG tablet Take 1 tablet by mouth daily.   cyclobenzaprine (FLEXERIL) 10 MG tablet Take 1 tablet (10 mg total) by mouth 3 (three) times daily as needed.   HYDROcodone-acetaminophen  (NORCO/VICODIN) 5-325 MG tablet Take 1 tablet by mouth every 8 (eight) hours as needed.   hydrOXYzine (VISTARIL) 25 MG capsule TAKE 1 CAPSULE BY MOUTH EVERY NIGHT AT BEDTIME AS NEEDED FOR SLEEP   ibuprofen (ADVIL) 800 MG tablet Take one every 8 hours with food as needed.   methocarbamol (ROBAXIN) 750 MG tablet Take 1 tablet (750 mg total) by mouth 4 (four) times daily.   sildenafil (VIAGRA) 100 MG tablet TAKE 1/2 TO 1 TABLET(50 TO 100 MG) BY MOUTH DAILY AS NEEDED FOR ERECTILE DYSFUNCTION   carvedilol (COREG) 6.25 MG tablet Take 1 tablet (6.25 mg total) by mouth 2 (two) times daily with a meal. (Patient not taking: Reported on 08/07/2022)   colchicine 0.6 MG tablet Take 1 tablet (0.6 mg total) by mouth 2 (two) times daily. (Patient not taking: Reported on 12/28/2022)   lisinopril-hydrochlorothiazide (ZESTORETIC) 20-25 MG tablet TAKE 1 TABLET BY MOUTH DAILY   No facility-administered encounter medications on file as of 12/28/2022.    Allergies (verified) Patient has no known allergies.   History: Past Medical History:  Diagnosis Date   Cancer [facility])    prostate cancer- 2011   Hypertension    Past Surgical History:  Procedure Laterality Date   COLONOSCOPY     KNEE SURGERY     PROSTATE SURGERY     ROTATOR CUFF REPAIR     left   Family History  Problem Relation Age of Onset   Cancer Mother  ovarian   Heart disease Father    Cancer Sister        throat    Colon cancer Neg Hx    Esophageal cancer Neg Hx    Rectal cancer Neg Hx    Stomach cancer Neg Hx    Social History   Socioeconomic History   Marital status: Married    Spouse name: Not on file   Number of children: 2   Years of education: Not on file   Highest education level: Not on file  Occupational History   Not on file  Tobacco Use   Smoking status: Never   Smokeless tobacco: Never  Vaping Use   Vaping status: Never Used  Substance and Sexual Activity   Alcohol use: Yes    Alcohol/week: 2.0 standard drinks  of alcohol    Types: 2 Standard drinks or equivalent per week   Drug use: Yes    Types: Hydrocodone   Sexual activity: Yes  Other Topics Concern   Not on file  Social History Narrative   Not on file   Social Determinants of Health   Financial Resource Strain: Low Risk  (12/28/2022)   Overall Financial Resource Strain (CARDIA)    Difficulty of Paying Living Expenses: Not hard at all  Food Insecurity: No Food Insecurity (12/28/2022)   Hunger Vital Sign    Worried About Running Out of Food in the Last Year: Never true    Ran Out of Food in the Last Year: Never true  Transportation Needs: No Transportation Needs (12/28/2022)   PRAPARE - Administrator, Civil Service (Medical): No    Lack of Transportation (Non-Medical): No  Physical Activity: Insufficiently Active (12/28/2022)   Exercise Vital Sign    Days of Exercise per Week: 3 days    Minutes of Exercise per Session: 40 min  Stress: No Stress Concern Present (12/28/2022)   [employer] of Occupational Health - Occupational Stress Questionnaire    Feeling of Stress : Not at all  Social Connections: Moderately Integrated (12/28/2022)   Social Connection and Isolation Panel [NHANES]    Frequency of Communication with Friends and Family: More than three times a week    Frequency of Social Gatherings with Friends and Family: Once a week    Attends Religious Services: More than 4 times per year    Active Member of [employer] or Organizations: No    Attends Engineer, structural: Never    Marital Status: Married    Tobacco Counseling Counseling given: Not Answered   Clinical Intake:  Pre-visit preparation completed: Yes  Pain : No/denies pain     Nutritional Risks: None Diabetes: No  How often do you need to have someone help you when you read instructions, pamphlets, or other written materials from your doctor or pharmacy?: 1 - Never  Interpreter Needed?: No  Information entered by :: NAllen  LPN   Activities of Daily Living    12/28/2022   10:42 AM  In your present state of health, do you have any difficulty performing the following activities:  Hearing? 0  Vision? 0  Difficulty concentrating or making decisions? 0  Walking or climbing stairs? 0  Dressing or bathing? 0  Doing errands, shopping? 0  Preparing Food and eating ? N  Using the Toilet? N  In the past six months, have you accidently leaked urine? N  Do you have problems with loss of bowel control? N  Managing your Medications? N  Managing  your Finances? N  Housekeeping or managing your Housekeeping? N    Patient Care Team: Mliss Sax, MD as PCP - General (Family Medicine) Gillian Shields, Alta Corning, MD as Consulting Physician (Urology)  Indicate any recent Medical Services you may have received from other than [facility] providers in the past year (date may be approximate).     Assessment:   This is a routine wellness examination for [place].  Hearing/Vision screen Hearing Screening - Comments:: Denies hearing issues Vision Screening - Comments:: No regular eye exams   Goals Addressed             This Visit's Progress    Patient Stated       12/28/2022, wants to lose weight       Depression Screen    12/28/2022   10:50 AM 08/07/2022    3:06 PM 06/19/2022   10:30 AM 04/16/2022   10:55 AM 12/23/2021   11:32 AM 09/23/2021   11:57 AM 09/23/2021   11:23 AM  PHQ 2/9 Scores  PHQ - 2 Score 0 0 0 0 0 0 0  PHQ- 9 Score 0     1     Fall Risk    12/28/2022   10:49 AM 08/07/2022    3:06 PM 06/19/2022   10:30 AM 04/16/2022   10:55 AM 12/23/2021   11:30 AM  Fall Risk   Falls in the past year? 0 0 0 0 0  Number falls in past yr: 0 0 0 0 0  Injury with Fall? 0 0 1 0 0  Risk for fall due to : Medication side effect No Fall Risks No Fall Risks No Fall Risks No Fall Risks  Follow up Falls prevention discussed;Falls evaluation completed Falls evaluation completed Falls evaluation completed Falls  evaluation completed Falls prevention discussed    MEDICARE RISK AT HOME: Medicare Risk at Home Any stairs in or around the home?: Yes If so, are there any without handrails?: Yes Home free of loose throw rugs in walkways, pet beds, electrical cords, etc?: Yes Adequate lighting in your home to reduce risk of falls?: Yes Life alert?: No Use of a cane, walker or w/c?: No Grab bars in the bathroom?: No Shower chair or bench in shower?: Yes Elevated toilet seat or a handicapped toilet?: Yes  TIMED UP AND GO:  Was the test performed?  No    Cognitive Function:        12/28/2022   10:51 AM 12/23/2021   11:34 AM 10/04/2018   10:06 AM  6CIT Screen  What Year? 0 points 0 points 0 points  What month? 0 points 0 points 0 points  What time? 0 points 0 points 0 points  Count back from 20 0 points 0 points 0 points  Months in reverse 0 points 0 points 0 points  Repeat phrase 0 points 0 points 0 points  Total Score 0 points 0 points 0 points    Immunizations Immunization History  Administered Date(s) Administered   Fluad Quad(high Dose 65+) 12/16/2018   Influenza Split 02/25/2017   Influenza, High Dose Seasonal PF 02/25/2017, 01/06/2022   Influenza,inj,Quad PF,6+ Mos 02/13/2013, 12/11/2014, 02/19/2016   PFIZER(Purple Top)SARS-COV-2 Vaccination 06/02/2019, 06/28/2019, 01/27/2020   Pneumococcal Polysaccharide-23 12/16/2018   Respiratory Syncytial Virus Vaccine,Recomb Aduvanted(Arexvy) 01/06/2022   Td 02/13/2013   Zoster Recombinant(Shingrix) 08/06/2021, 02/12/2022    TDAP status: Up to date  Flu Vaccine status: Due, Education has been provided regarding the importance of this vaccine. Advised may receive this  vaccine at local pharmacy or Health Dept. Aware to provide a copy of the vaccination record if obtained from local pharmacy or Health Dept. Verbalized acceptance and understanding.  Pneumococcal vaccine status: Due, Education has been provided regarding the importance of this  vaccine. Advised may receive this vaccine at local pharmacy or Health Dept. Aware to provide a copy of the vaccination record if obtained from local pharmacy or Health Dept. Verbalized acceptance and understanding.  Covid-19 vaccine status: Information provided on how to obtain vaccines.   Qualifies for Shingles Vaccine? Yes   Zostavax completed Yes   Shingrix Completed?: Yes  Screening Tests Health Maintenance  Topic Date Due   Pneumonia Vaccine 13+ Years old (2 of 2 - PCV) 12/16/2019   INFLUENZA VACCINE  10/29/2022   DTaP/Tdap/Td (2 - Tdap) 02/14/2023   Medicare Annual Wellness (AWV)  12/28/2023   Colonoscopy  11/22/2025   Hepatitis C Screening  Completed   Zoster Vaccines- Shingrix  Completed   HPV VACCINES  Aged Out   COVID-19 Vaccine  Discontinued    Health Maintenance  Health Maintenance Due  Topic Date Due   Pneumonia Vaccine 40+ Years old (2 of 2 - PCV) 12/16/2019   INFLUENZA VACCINE  10/29/2022    Colorectal cancer screening: Type of screening: Colonoscopy. Completed 11/23/2018. Repeat every 7 years  Lung Cancer Screening: (Low Dose CT Chest recommended if Age 78-80 years, 20 pack-year currently smoking OR have quit w/in 15years.) does not qualify.   Lung Cancer Screening Referral: no  Additional Screening:  Hepatitis C Screening: does qualify; Completed 12/11/2014  Vision Screening: Recommended annual ophthalmology exams for early detection of glaucoma and other disorders of the eye. Is the patient up to date with their annual eye exam?  No  Who is the provider or what is the name of the office in which the patient attends annual eye exams? none If pt is not established with a provider, would they like to be referred to a provider to establish care? No .   Dental Screening: Recommended annual dental exams for proper oral hygiene  Diabetic Foot Exam: n/a  Community Resource Referral / Chronic Care Management: CRR required this visit?  No   CCM required this  visit?  No     Plan:     I have personally reviewed and noted the following in the patient's chart:   Medical and social history Use of alcohol, tobacco or illicit drugs  Current medications and supplements including opioid prescriptions. Patient is currently taking opioid prescriptions. Information provided to patient regarding non-opioid alternatives. Patient advised to discuss non-opioid treatment plan with their provider. Functional ability and status Nutritional status Physical activity Advanced directives List of other physicians Hospitalizations, surgeries, and ER visits in previous 12 months Vitals Screenings to include cognitive, depression, and falls Referrals and appointments  In addition, I have reviewed and discussed with patient certain preventive protocols, quality metrics, and best practice recommendations. A written personalized care plan for preventive services as well as general preventive health recommendations were provided to patient.     Barb Merino, LPN   1/61/0960   After Visit Summary: (Pick Up) Due to this being a telephonic visit, with patients personalized plan was offered to patient and patient has requested to Pick up at office.  Nurse Notes: none

## 2022-12-30 ENCOUNTER — Other Ambulatory Visit: Payer: Self-pay | Admitting: Family Medicine

## 2022-12-30 DIAGNOSIS — I10 Essential (primary) hypertension: Secondary | ICD-10-CM

## 2023-03-16 DIAGNOSIS — N529 Male erectile dysfunction, unspecified: Secondary | ICD-10-CM | POA: Diagnosis not present

## 2023-03-16 DIAGNOSIS — C61 Malignant neoplasm of prostate: Secondary | ICD-10-CM | POA: Diagnosis not present

## 2023-03-25 ENCOUNTER — Other Ambulatory Visit: Payer: Self-pay | Admitting: Family Medicine

## 2023-03-25 DIAGNOSIS — M545 Low back pain, unspecified: Secondary | ICD-10-CM

## 2023-04-11 ENCOUNTER — Other Ambulatory Visit: Payer: Self-pay | Admitting: Family Medicine

## 2023-04-11 DIAGNOSIS — I10 Essential (primary) hypertension: Secondary | ICD-10-CM

## 2023-04-12 ENCOUNTER — Other Ambulatory Visit: Payer: Self-pay | Admitting: Family Medicine

## 2023-04-12 DIAGNOSIS — I10 Essential (primary) hypertension: Secondary | ICD-10-CM

## 2023-04-28 ENCOUNTER — Other Ambulatory Visit: Payer: Self-pay

## 2023-04-28 DIAGNOSIS — I10 Essential (primary) hypertension: Secondary | ICD-10-CM

## 2023-04-28 NOTE — Telephone Encounter (Signed)
Requesting: Lisinopril/hydrochlorothiazide 20-25mg  Last Visit: 08-07-22 Next Visit: 01-03-24 Last Refill: 04-12-23  Please Advise   PATIENT IS REQUESTING A 90 DAY SUPPLY

## 2023-04-29 NOTE — Telephone Encounter (Signed)
LVM for patient to return call.

## 2023-04-30 NOTE — Telephone Encounter (Signed)
I called and left patient a detailed message to call the office to schedule an appointment to see Dr. Doreene Burke

## 2023-05-24 ENCOUNTER — Other Ambulatory Visit: Payer: Self-pay | Admitting: Family Medicine

## 2023-06-17 ENCOUNTER — Ambulatory Visit (INDEPENDENT_AMBULATORY_CARE_PROVIDER_SITE_OTHER): Payer: Medicare Other | Admitting: Family Medicine

## 2023-06-17 ENCOUNTER — Encounter: Payer: Self-pay | Admitting: Family Medicine

## 2023-06-17 VITALS — BP 142/84 | HR 96 | Temp 97.0°F | Ht 72.0 in | Wt 223.8 lb

## 2023-06-17 DIAGNOSIS — M545 Low back pain, unspecified: Secondary | ICD-10-CM

## 2023-06-17 DIAGNOSIS — F5101 Primary insomnia: Secondary | ICD-10-CM

## 2023-06-17 DIAGNOSIS — Z8739 Personal history of other diseases of the musculoskeletal system and connective tissue: Secondary | ICD-10-CM | POA: Diagnosis not present

## 2023-06-17 DIAGNOSIS — I10 Essential (primary) hypertension: Secondary | ICD-10-CM | POA: Diagnosis not present

## 2023-06-17 DIAGNOSIS — Z13220 Encounter for screening for lipoid disorders: Secondary | ICD-10-CM | POA: Diagnosis not present

## 2023-06-17 DIAGNOSIS — G8929 Other chronic pain: Secondary | ICD-10-CM

## 2023-06-17 DIAGNOSIS — R7303 Prediabetes: Secondary | ICD-10-CM

## 2023-06-17 LAB — LIPID PANEL
Cholesterol: 181 mg/dL (ref 0–200)
HDL: 59.1 mg/dL (ref >39.00)
LDL Cholesterol: 101 mg/dL — ABNORMAL HIGH (ref 0–99)
NonHDL: 121.97
Total CHOL/HDL Ratio: 3
Triglycerides: 105 mg/dL (ref 0.0–149.0)
VLDL: 21 mg/dL (ref 0.0–40.0)

## 2023-06-17 LAB — COMPREHENSIVE METABOLIC PANEL
ALT: 13 U/L (ref 0–53)
AST: 16 U/L (ref 0–37)
Albumin: 4.6 g/dL (ref 3.5–5.2)
Alkaline Phosphatase: 61 U/L (ref 39–117)
BUN: 16 mg/dL (ref 6–23)
CO2: 29 meq/L (ref 19–32)
Calcium: 10.1 mg/dL (ref 8.4–10.5)
Chloride: 99 meq/L (ref 96–112)
Creatinine, Ser: 0.89 mg/dL (ref 0.40–1.50)
GFR: 86.1 mL/min (ref >60.00)
Glucose, Bld: 114 mg/dL — ABNORMAL HIGH (ref 70–99)
Potassium: 3.7 meq/L (ref 3.5–5.1)
Sodium: 137 meq/L (ref 135–145)
Total Bilirubin: 0.5 mg/dL (ref 0.2–1.2)
Total Protein: 7.3 g/dL (ref 6.0–8.3)

## 2023-06-17 LAB — URINALYSIS, ROUTINE W REFLEX MICROSCOPIC
Bilirubin Urine: NEGATIVE
Hgb urine dipstick: NEGATIVE
Ketones, ur: NEGATIVE
Leukocytes,Ua: NEGATIVE
Nitrite: NEGATIVE
RBC / HPF: NONE SEEN (ref 0–?)
Specific Gravity, Urine: 1.02 (ref 1.000–1.030)
Total Protein, Urine: NEGATIVE
Urine Glucose: NEGATIVE
Urobilinogen, UA: 0.2 (ref 0.0–1.0)
WBC, UA: NONE SEEN (ref 0–?)
pH: 6 (ref 5.0–8.0)

## 2023-06-17 LAB — CBC
HCT: 46.2 % (ref 39.0–52.0)
Hemoglobin: 15.3 g/dL (ref 13.0–17.0)
MCHC: 33.2 g/dL (ref 30.0–36.0)
MCV: 84.1 fl (ref 78.0–100.0)
Platelets: 240 10*3/uL (ref 150.0–400.0)
RBC: 5.5 Mil/uL (ref 4.22–5.81)
RDW: 13.8 % (ref 11.5–15.5)
WBC: 5.7 10*3/uL (ref 4.0–10.5)

## 2023-06-17 LAB — URIC ACID: Uric Acid, Serum: 6.4 mg/dL (ref 4.0–7.8)

## 2023-06-17 LAB — HEMOGLOBIN A1C: Hgb A1c MFr Bld: 6 % (ref 4.6–6.5)

## 2023-06-17 MED ORDER — HYDROXYZINE HCL 25 MG PO TABS: 25.0000 mg | ORAL_TABLET | Freq: Every evening | ORAL | 0 refills | Status: DC | PRN

## 2023-06-17 MED ORDER — IBUPROFEN 800 MG PO TABS
ORAL_TABLET | ORAL | 0 refills | Status: AC
Start: 2023-06-17 — End: ?

## 2023-06-17 MED ORDER — HYDROXYZINE HCL 25 MG PO TABS: 25.0000 mg | ORAL_TABLET | Freq: Every evening | ORAL | 0 refills | Status: AC | PRN

## 2023-06-17 MED ORDER — METHOCARBAMOL 750 MG PO TABS: 750.0000 mg | ORAL_TABLET | Freq: Four times a day (QID) | ORAL | 1 refills | Status: AC

## 2023-06-17 MED ORDER — LISINOPRIL-HYDROCHLOROTHIAZIDE 20-25 MG PO TABS: 1.0000 | ORAL_TABLET | Freq: Every day | ORAL | 1 refills | Status: DC

## 2023-06-17 MED ORDER — AMLODIPINE BESYLATE 10 MG PO TABS: 10.0000 mg | ORAL_TABLET | Freq: Every day | ORAL | 3 refills | Status: DC

## 2023-06-17 NOTE — Progress Notes (Signed)
 Established Patient Office Visit   Subjective:  Patient ID: Jason Valencia, male    DOB: [birth year]  Age: 73 y.o. MRN: [id number]  Chief Complaint  Patient presents with   Hand Pain    Left 2-4 finger edema, stiffness and pain in joints. Pt states ibuprofen.    Medical Management of Chronic Issues    Follow up. Pt is not fasting. Needs Rx refills for all meds.    Hand Pain  Pertinent negatives include no tingling.   Encounter Diagnoses  Name Primary?   Essential hypertension Yes   Chronic midline low back pain without sciatica    Primary insomnia    Prediabetes    Screening for cholesterol level    History of gout    Here to be seen for above.  Lost to follow-up for the last 10 months.  Blood pressure at home runs in the 130s to 140s over 70-80.  Continues ibuprofen and Robaxin for chronic lower back pain.  Uses Vistaril at night as needed for sleep.  Continues follow-up with urology with history of prostate cancer.  Last saw Dr. Earlene Plater in February of this year.   Review of Systems  Constitutional: Negative.   HENT: Negative.    Eyes:  Negative for blurred vision, discharge and redness.  Respiratory: Negative.    Cardiovascular: Negative.   Gastrointestinal:  Negative for abdominal pain.  Genitourinary: Negative.   Musculoskeletal: Negative.  Negative for myalgias.  Skin:  Negative for rash.  Neurological:  Negative for tingling, loss of consciousness and weakness.  Endo/Heme/Allergies:  Negative for polydipsia.     Current Outpatient Medications:    ascorbic acid (VITAMIN C) 100 MG tablet, Take 1 tablet by mouth daily., Disp: , Rfl:    Cholecalciferol (VITAMIN D-1000 MAX ST) 25 MCG (1000 UT) tablet, Take by mouth., Disp: , Rfl:    Coenzyme Q10 50 MG CAPS, Take 1 capsule by mouth daily., Disp: , Rfl:    colchicine 0.6 MG tablet, Take 1 tablet (0.6 mg total) by mouth 2 (two) times daily., Disp: 60 tablet, Rfl: 2   cyanocobalamin 100 MCG tablet, Take 1 tablet by mouth  daily., Disp: , Rfl:    cyclobenzaprine (FLEXERIL) 10 MG tablet, Take 1 tablet (10 mg total) by mouth 3 (three) times daily as needed., Disp: 90 tablet, Rfl: 1   sildenafil (VIAGRA) 100 MG tablet, TAKE 1/2 TO 1 TABLET(50 TO 100 MG) BY MOUTH DAILY AS NEEDED FOR ERECTILE DYSFUNCTION, Disp: 5 tablet, Rfl: 11   amLODipine (NORVASC) 10 MG tablet, Take 1 tablet (10 mg total) by mouth daily., Disp: 90 tablet, Rfl: 3   HYDROcodone-acetaminophen (NORCO/VICODIN) 5-325 MG tablet, Take 1 tablet by mouth every 8 (eight) hours as needed. (Patient not taking: Reported on 06/17/2023), Disp: 15 tablet, Rfl: 0   hydrOXYzine (ATARAX) 25 MG tablet, Take 1 tablet (25 mg total) by mouth at bedtime as needed., Disp: 120 tablet, Rfl: 0   ibuprofen (ADVIL) 800 MG tablet, Take one every 8 hours with food as needed., Disp: 90 tablet, Rfl: 0   lisinopril-hydrochlorothiazide (ZESTORETIC) 20-25 MG tablet, Take 1 tablet by mouth daily., Disp: 30 tablet, Rfl: 1   methocarbamol (ROBAXIN) 750 MG tablet, Take 1 tablet (750 mg total) by mouth 4 (four) times daily., Disp: 90 tablet, Rfl: 1   Objective:     BP (!) 142/84   Pulse 96   Temp (!) 97 F (36.1 C)   Ht 6' (1.829 m)   Wt 223 lb  12.8 oz (101.5 kg)   SpO2 99%   BMI 30.35 kg/m  BP Readings from Last 3 Encounters:  06/17/23 (!) 142/84  08/07/22 (!) 156/93  07/24/22 (!) 146/82   Wt Readings from Last 3 Encounters:  06/17/23 223 lb 12.8 oz (101.5 kg)  08/20/22 228 lb (103.4 kg)  08/07/22 228 lb 3.2 oz (103.5 kg)      Physical Exam Constitutional:      General: He is not in acute distress.    Appearance: Normal appearance. He is not ill-appearing, toxic-appearing or diaphoretic.  HENT:     Head: Normocephalic and atraumatic.     Right Ear: External ear normal.     Left Ear: External ear normal.     Mouth/Throat:     Mouth: Mucous membranes are moist.     Pharynx: Oropharynx is clear. No oropharyngeal exudate or posterior oropharyngeal erythema.  Eyes:      General: No scleral icterus.       Right eye: No discharge.        Left eye: No discharge.     Extraocular Movements: Extraocular movements intact.     Conjunctiva/sclera: Conjunctivae normal.     Pupils: Pupils are equal, round, and reactive to light.  Cardiovascular:     Rate and Rhythm: Normal rate and regular rhythm.  Pulmonary:     Effort: Pulmonary effort is normal. No respiratory distress.     Breath sounds: Normal breath sounds. No wheezing or rales.  Abdominal:     General: Bowel sounds are normal.  Musculoskeletal:     Cervical back: No rigidity or tenderness.  Lymphadenopathy:     Cervical: No cervical adenopathy.  Skin:    General: Skin is warm and dry.  Neurological:     Mental Status: He is alert and oriented to person, place, and time.  Psychiatric:        Mood and Affect: Mood normal.        Behavior: Behavior normal.          The 10-year ASCVD risk score (Arnett DK, et al., 2019) is: 21.3%    Assessment & Plan:   Essential hypertension -     Lisinopril-hydroCHLOROthiazide; Take 1 tablet by mouth daily.  Dispense: 30 tablet; Refill: 1 -     amLODIPine Besylate; Take 1 tablet (10 mg total) by mouth daily.  Dispense: 90 tablet; Refill: 3 -     CBC -     Comprehensive metabolic panel -     Urinalysis, Routine w reflex microscopic  Chronic midline low back pain without sciatica -     Ibuprofen; Take one every 8 hours with food as needed.  Dispense: 90 tablet; Refill: 0 -     Methocarbamol; Take 1 tablet (750 mg total) by mouth 4 (four) times daily.  Dispense: 90 tablet; Refill: 1  Primary insomnia -     hydrOXYzine HCl; Take 1 tablet (25 mg total) by mouth at bedtime as needed.  Dispense: 120 tablet; Refill: 0  Prediabetes -     Comprehensive metabolic panel -     Hemoglobin A1c  Screening for cholesterol level -     Comprehensive metabolic panel -     Lipid panel  History of gout -     Comprehensive metabolic panel -     Uric acid    Return  in about 3 months (around 09/17/2023), or Please bring your cuff with you for next visit.Mliss Sax, MD

## 2023-07-04 ENCOUNTER — Other Ambulatory Visit: Payer: Self-pay | Admitting: Family Medicine

## 2023-07-04 DIAGNOSIS — I10 Essential (primary) hypertension: Secondary | ICD-10-CM

## 2023-08-04 ENCOUNTER — Other Ambulatory Visit: Payer: Self-pay | Admitting: Sports Medicine

## 2023-08-04 ENCOUNTER — Ambulatory Visit: Admitting: Sports Medicine

## 2023-08-04 ENCOUNTER — Encounter: Payer: Self-pay | Admitting: Sports Medicine

## 2023-08-04 VITALS — BP 130/88 | Ht 72.0 in | Wt 223.0 lb

## 2023-08-04 DIAGNOSIS — M47817 Spondylosis without myelopathy or radiculopathy, lumbosacral region: Secondary | ICD-10-CM

## 2023-08-04 DIAGNOSIS — M7989 Other specified soft tissue disorders: Secondary | ICD-10-CM | POA: Diagnosis not present

## 2023-08-04 MED ORDER — HYDROCODONE-ACETAMINOPHEN 5-325 MG PO TABS: 1.0000 | ORAL_TABLET | Freq: Three times a day (TID) | ORAL | 0 refills | Status: AC | PRN

## 2023-08-04 MED ORDER — COLCHICINE 0.6 MG PO TABS: 0.6000 mg | ORAL_TABLET | Freq: Two times a day (BID) | ORAL | 2 refills | Status: AC

## 2023-08-04 NOTE — Progress Notes (Signed)
   Subjective:    Patient ID: Jason Valencia, male    DOB: [birth year], 73 y.o.   MRN: [id number]  HPI chief complaint left middle finger pain and low back pain  Jason Valencia presents today with a couple different complaints.  He has been experiencing some swelling in his left middle finger.  He has a history of gout and feels like it is probably due to that.  He has been prescribed colchicine  in the past but does not have any left.  This finger pain has slowly been improving.  This morning he awoke and twisted his low back.  He has had intermittent pain in his low back before.  He has a prescription for hydrocodone  that he takes sparingly.  Low back pain is also improving.    Review of Systems As above    Objective:   Physical Exam  Well-developed, well-nourished.  No acute distress  Left hand: Left middle finger does have some swelling through the course of the flexor tendon.  Is not tender to palpation.  Good mobility.  No active triggering.  Lumbar spine: Some slight tenderness to palpation along the left lower spine.  Good lumbar range of motion.  No spasm.      Assessment & Plan:   Left middle finger swelling History of gout Lumbar strain/degenerative disc disease  I have agreed to refill both his colchicine  and his hydrocodone .  Follow-up as needed.  This note was dictated using Dragon naturally speaking software and may contain errors in syntax, spelling, or content which have not been identified prior to signing this note.

## 2023-08-16 ENCOUNTER — Other Ambulatory Visit: Payer: Self-pay | Admitting: Family Medicine

## 2023-08-16 ENCOUNTER — Other Ambulatory Visit: Payer: Self-pay | Admitting: *Deleted

## 2023-08-16 DIAGNOSIS — I10 Essential (primary) hypertension: Secondary | ICD-10-CM

## 2023-08-16 MED ORDER — ALLOPURINOL 100 MG PO TABS: 100.0000 mg | ORAL_TABLET | Freq: Every day | ORAL | 0 refills | Status: DC

## 2023-09-15 NOTE — Progress Notes (Signed)
   09/15/2023  Patient ID: Jason Valencia, male   DOB: 1950/10/10, 73 y.o.   MRN: [id number]  This patient is appearing on a report for being at risk of failing the adherence measure for hypertension (ACEi/ARB) medications this calendar year.   Medication: lisinopril /hydrochlorothiazide  20/25mg  daily Last fill date: 08/16/23 for 30 day supply  Insurance report was not up to date. No action needed at this time.   Linn Rich, PharmD, DPLA

## 2023-09-21 ENCOUNTER — Other Ambulatory Visit: Payer: Self-pay | Admitting: *Deleted

## 2023-09-21 MED ORDER — ALLOPURINOL 100 MG PO TABS: 100.0000 mg | ORAL_TABLET | Freq: Every day | ORAL | 0 refills | Status: DC

## 2023-09-23 ENCOUNTER — Ambulatory Visit: Payer: Self-pay | Admitting: Family Medicine

## 2023-09-23 ENCOUNTER — Encounter: Payer: Self-pay | Admitting: Family Medicine

## 2023-09-23 ENCOUNTER — Ambulatory Visit (INDEPENDENT_AMBULATORY_CARE_PROVIDER_SITE_OTHER): Admitting: Family Medicine

## 2023-09-23 VITALS — BP 138/86 | HR 86 | Temp 96.6°F | Ht 72.0 in | Wt 215.8 lb

## 2023-09-23 DIAGNOSIS — R7989 Other specified abnormal findings of blood chemistry: Secondary | ICD-10-CM

## 2023-09-23 DIAGNOSIS — I10 Essential (primary) hypertension: Secondary | ICD-10-CM | POA: Diagnosis not present

## 2023-09-23 DIAGNOSIS — R7303 Prediabetes: Secondary | ICD-10-CM

## 2023-09-23 LAB — BASIC METABOLIC PANEL WITH GFR
BUN: 18 mg/dL (ref 6–23)
CO2: 30 meq/L (ref 19–32)
Calcium: 9.5 mg/dL (ref 8.4–10.5)
Chloride: 101 meq/L (ref 96–112)
Creatinine, Ser: 0.97 mg/dL (ref 0.40–1.50)
GFR: 78.29 mL/min (ref >60.00)
Glucose, Bld: 112 mg/dL — ABNORMAL HIGH (ref 70–99)
Potassium: 3.8 meq/L (ref 3.5–5.1)
Sodium: 137 meq/L (ref 135–145)

## 2023-09-23 LAB — TSH: TSH: 0.62 u[IU]/mL (ref 0.35–5.50)

## 2023-09-23 LAB — HEMOGLOBIN A1C: Hgb A1c MFr Bld: 5.9 % (ref 4.6–6.5)

## 2023-09-23 MED ORDER — LISINOPRIL-HYDROCHLOROTHIAZIDE 20-25 MG PO TABS: 1.0000 | ORAL_TABLET | Freq: Every day | ORAL | 1 refills | Status: DC

## 2023-09-23 MED ORDER — AMLODIPINE BESYLATE 10 MG PO TABS: 10.0000 mg | ORAL_TABLET | Freq: Every day | ORAL | 3 refills | Status: DC

## 2023-09-23 NOTE — Progress Notes (Addendum)
 Established Patient Office Visit   Subjective:  Patient ID: Jason Valencia, male    DOB: [birth year]  Age: 73 y.o. MRN: [id number]  Chief Complaint  Patient presents with   Medical Management of Chronic Issues    3 month follow up. HTN   Hand Pain    Left middle finger pain and stiffness. Pt requested to have a xray done. Pt does not believe it is his gout.     Hand Pain  Pertinent negatives include no tingling.   Encounter Diagnoses  Name Primary?   Essential hypertension Yes   Prediabetes    Abnormal TSH    For follow-up of above.  Patient has been exercising, eating well and trying to lose some weight.  He has lost 8 pounds.  Pharmacy questions his compliance with the Zestoretic .  In the room today he is gone back and forth as to rather or not he's been taking the Zestoretic .  He did some kind of a heart study at [facility] and is waiting on the results.  Continues with sports medicine for lower back pain.   Review of Systems  Constitutional: Negative.   HENT: Negative.    Eyes:  Negative for blurred vision, discharge and redness.  Respiratory: Negative.    Cardiovascular: Negative.   Gastrointestinal:  Negative for abdominal pain.  Genitourinary: Negative.   Musculoskeletal: Negative.  Negative for myalgias.  Skin:  Negative for rash.  Neurological:  Negative for tingling, loss of consciousness and weakness.  Endo/Heme/Allergies:  Negative for polydipsia.     Current Outpatient Medications:    allopurinol  (ZYLOPRIM ) 100 MG tablet, Take 1 tablet (100 mg total) by mouth daily., Disp: 30 tablet, Rfl: 0   ascorbic acid (VITAMIN C) 100 MG tablet, Take 1 tablet by mouth daily., Disp: , Rfl:    Cholecalciferol (VITAMIN D -1000 MAX ST) 25 MCG (1000 UT) tablet, Take by mouth., Disp: , Rfl:    Coenzyme Q10 50 MG CAPS, Take 1 capsule by mouth daily., Disp: , Rfl:    colchicine  0.6 MG tablet, Take 1 tablet (0.6 mg total) by mouth 2 (two) times daily., Disp: 60 tablet, Rfl: 2    cyanocobalamin 100 MCG tablet, Take 1 tablet by mouth daily., Disp: , Rfl:    cyclobenzaprine  (FLEXERIL ) 10 MG tablet, Take 1 tablet (10 mg total) by mouth 3 (three) times daily as needed., Disp: 90 tablet, Rfl: 1   HYDROcodone -acetaminophen  (NORCO/VICODIN) 5-325 MG tablet, Take 1 tablet by mouth every 8 (eight) hours as needed., Disp: 15 tablet, Rfl: 0   hydrOXYzine  (ATARAX ) 25 MG tablet, Take 1 tablet (25 mg total) by mouth at bedtime as needed., Disp: 120 tablet, Rfl: 0   ibuprofen  (ADVIL ) 800 MG tablet, Take one every 8 hours with food as needed., Disp: 90 tablet, Rfl: 0   methocarbamol  (ROBAXIN ) 750 MG tablet, Take 1 tablet (750 mg total) by mouth 4 (four) times daily., Disp: 90 tablet, Rfl: 1   sildenafil  (VIAGRA ) 100 MG tablet, TAKE 1/2 TO 1 TABLET(50 TO 100 MG) BY MOUTH DAILY AS NEEDED FOR ERECTILE DYSFUNCTION, Disp: 5 tablet, Rfl: 11   amLODipine  (NORVASC ) 10 MG tablet, Take 1 tablet (10 mg total) by mouth daily., Disp: 90 tablet, Rfl: 3   lisinopril -hydrochlorothiazide  (ZESTORETIC ) 20-25 MG tablet, Take 1 tablet by mouth daily., Disp: 90 tablet, Rfl: 1   Objective:     BP 138/86 (Cuff Size: Large)   Pulse 86   Temp (!) 96.6 F (35.9 C) (Temporal)  Ht 6' (1.829 m)   Wt 215 lb 12.8 oz (97.9 kg)   SpO2 98%   BMI 29.27 kg/m  BP Readings from Last 3 Encounters:  09/23/23 138/86  08/04/23 130/88  06/17/23 (!) 142/84   Wt Readings from Last 3 Encounters:  09/23/23 215 lb 12.8 oz (97.9 kg)  08/04/23 223 lb (101.2 kg)  06/17/23 223 lb 12.8 oz (101.5 kg)      Physical Exam Constitutional:      General: He is not in acute distress.    Appearance: Normal appearance. He is not ill-appearing, toxic-appearing or diaphoretic.  HENT:     Head: Normocephalic and atraumatic.     Right Ear: External ear normal.     Left Ear: External ear normal.     Mouth/Throat:     Mouth: Mucous membranes are moist.     Pharynx: Oropharynx is clear. No oropharyngeal exudate or posterior  oropharyngeal erythema.   Eyes:     General: No scleral icterus.       Right eye: No discharge.        Left eye: No discharge.     Extraocular Movements: Extraocular movements intact.     Conjunctiva/sclera: Conjunctivae normal.     Pupils: Pupils are equal, round, and reactive to light.    Cardiovascular:     Rate and Rhythm: Normal rate and regular rhythm.  Pulmonary:     Effort: Pulmonary effort is normal. No respiratory distress.     Breath sounds: Normal breath sounds. No wheezing or rales.  Abdominal:     General: Bowel sounds are normal.   Musculoskeletal:     Cervical back: No rigidity or tenderness.   Skin:    General: Skin is warm and dry.   Neurological:     Mental Status: He is alert and oriented to person, place, and time.   Psychiatric:        Mood and Affect: Mood normal.        Behavior: Behavior normal.      Results for orders placed or performed in visit on 09/23/23  Basic metabolic panel with GFR  Result Value Ref Range   Sodium 137 135 - 145 mEq/L   Potassium 3.8 3.5 - 5.1 mEq/L   Chloride 101 96 - 112 mEq/L   CO2 30 19 - 32 mEq/L   Glucose, Bld 112 (H) 70 - 99 mg/dL   BUN 18 6 - 23 mg/dL   Creatinine, Ser 9.02 0.40 - 1.50 mg/dL   GFR 21.70 >39.99 mL/min   Calcium 9.5 8.4 - 10.5 mg/dL  TSH  Result Value Ref Range   TSH 0.62 0.35 - 5.50 uIU/mL  Hemoglobin A1c  Result Value Ref Range   Hgb A1c MFr Bld 5.9 4.6 - 6.5 %      The 10-year ASCVD risk score (Arnett DK, et al., 2019) is: 20.7%    Assessment & Plan:   Essential hypertension -     Basic metabolic panel with GFR -     Lisinopril -hydroCHLOROthiazide ; Take 1 tablet by mouth daily.  Dispense: 90 tablet; Refill: 1 -     amLODIPine  Besylate; Take 1 tablet (10 mg total) by mouth daily.  Dispense: 90 tablet; Refill: 3  Prediabetes -     Basic metabolic panel with GFR -     Hemoglobin A1c  Abnormal TSH -     TSH -     T3, free; Future -     T4, free; Future  Return in  about 3 months (around 12/24/2023), or Bring all of your medications and blood pressure cuff with you next visit.  Patient believes that he can control his blood pressure completely by lifestyle.  I am not certain that he is actually taking the medications that I am prescribing.  As above I asked him to bring all of his medications with him for his next visit. Compliance?   Elsie Sim Lent, MD

## 2023-09-23 NOTE — Addendum Note (Signed)
 Addended by: BERNETA ELSIE LABOR on: 09/23/2023 05:03 PM   Modules accepted: Orders

## 2023-10-21 ENCOUNTER — Other Ambulatory Visit: Payer: Self-pay | Admitting: *Deleted

## 2023-10-21 MED ORDER — ALLOPURINOL 100 MG PO TABS: 100.0000 mg | ORAL_TABLET | Freq: Every day | ORAL | 0 refills | Status: DC

## 2023-10-22 ENCOUNTER — Other Ambulatory Visit: Payer: Self-pay | Admitting: Family Medicine

## 2023-10-22 DIAGNOSIS — I10 Essential (primary) hypertension: Secondary | ICD-10-CM

## 2023-11-23 ENCOUNTER — Other Ambulatory Visit: Payer: Self-pay | Admitting: Family Medicine

## 2023-11-23 DIAGNOSIS — I10 Essential (primary) hypertension: Secondary | ICD-10-CM

## 2023-12-28 ENCOUNTER — Encounter: Payer: Self-pay | Admitting: Family Medicine

## 2023-12-28 ENCOUNTER — Ambulatory Visit: Admitting: Family Medicine

## 2023-12-28 VITALS — BP 162/90 | HR 100 | Temp 96.8°F | Ht 72.0 in | Wt 219.4 lb

## 2023-12-28 DIAGNOSIS — R7989 Other specified abnormal findings of blood chemistry: Secondary | ICD-10-CM | POA: Diagnosis not present

## 2023-12-28 DIAGNOSIS — R7303 Prediabetes: Secondary | ICD-10-CM

## 2023-12-28 DIAGNOSIS — Z23 Encounter for immunization: Secondary | ICD-10-CM | POA: Diagnosis not present

## 2023-12-28 DIAGNOSIS — I10 Essential (primary) hypertension: Secondary | ICD-10-CM

## 2023-12-28 DIAGNOSIS — Z8739 Personal history of other diseases of the musculoskeletal system and connective tissue: Secondary | ICD-10-CM

## 2023-12-28 LAB — COMPREHENSIVE METABOLIC PANEL WITH GFR
ALT: 15 U/L (ref 0–53)
AST: 18 U/L (ref 0–37)
Albumin: 4.4 g/dL (ref 3.5–5.2)
Alkaline Phosphatase: 60 U/L (ref 39–117)
BUN: 12 mg/dL (ref 6–23)
CO2: 28 meq/L (ref 19–32)
Calcium: 10.1 mg/dL (ref 8.4–10.5)
Chloride: 100 meq/L (ref 96–112)
Creatinine, Ser: 0.89 mg/dL (ref 0.40–1.50)
GFR: 85.78 mL/min (ref >60.00)
Glucose, Bld: 117 mg/dL — ABNORMAL HIGH (ref 70–99)
Potassium: 3.4 meq/L — ABNORMAL LOW (ref 3.5–5.1)
Sodium: 137 meq/L (ref 135–145)
Total Bilirubin: 0.7 mg/dL (ref 0.2–1.2)
Total Protein: 7.5 g/dL (ref 6.0–8.3)

## 2023-12-28 LAB — T4, FREE: Free T4: 0.96 ng/dL (ref 0.60–1.60)

## 2023-12-28 LAB — T3, FREE: T3, Free: 2.8 pg/mL (ref 2.3–4.2)

## 2023-12-28 LAB — URIC ACID: Uric Acid, Serum: 5.8 mg/dL (ref 4.0–7.8)

## 2023-12-28 LAB — HEMOGLOBIN A1C: Hgb A1c MFr Bld: 5.9 % (ref 4.6–6.5)

## 2023-12-28 MED ORDER — AMLODIPINE BESYLATE 10 MG PO TABS: 10.0000 mg | ORAL_TABLET | Freq: Every day | ORAL | 3 refills | Status: AC

## 2023-12-28 MED ORDER — ALLOPURINOL 100 MG PO TABS: 100.0000 mg | ORAL_TABLET | Freq: Every day | ORAL | 1 refills | Status: DC

## 2023-12-28 NOTE — Progress Notes (Unsigned)
 Established Patient Office Visit   Subjective:  Patient ID: Jason Valencia, male    DOB: [birth year]  Age: 73 y.o. MRN: [id number]  Chief Complaint  Patient presents with   Medical Management of Chronic Issues    3 mon f/u for medication compliance. Pt states he has been taking medications as prescribed. No specific questions or concerns. Pt would like his flu & Pneumococcal vaccine    HPI Encounter Diagnoses  Name Primary?   White coat syndrome with diagnosis of hypertension Yes   Prediabetes    Abnormal TSH    Immunization due    History of gout    Blood pressures at home are running in the 120s over 70s and low 80s at home.  Assures compliance with Zestoretic  and amlodipine .  Having no issues taking them.  Pulse rate at home typically runs in the 70s. {History (Optional):23778}  Review of Systems  Constitutional: Negative.   HENT: Negative.    Eyes:  Negative for blurred vision, discharge and redness.  Respiratory: Negative.    Cardiovascular: Negative.   Gastrointestinal:  Negative for abdominal pain.  Genitourinary: Negative.   Musculoskeletal: Negative.  Negative for myalgias.  Skin:  Negative for rash.  Neurological:  Negative for tingling, loss of consciousness and weakness.  Endo/Heme/Allergies:  Negative for polydipsia.     Current Outpatient Medications:    ascorbic acid (VITAMIN C) 100 MG tablet, Take 1 tablet by mouth daily., Disp: , Rfl:    Cholecalciferol (VITAMIN D -1000 MAX ST) 25 MCG (1000 UT) tablet, Take by mouth., Disp: , Rfl:    Coenzyme Q10 50 MG CAPS, Take 1 capsule by mouth daily., Disp: , Rfl:    cyanocobalamin 100 MCG tablet, Take 1 tablet by mouth daily., Disp: , Rfl:    cyclobenzaprine  (FLEXERIL ) 10 MG tablet, Take 1 tablet (10 mg total) by mouth 3 (three) times daily as needed., Disp: 90 tablet, Rfl: 1   HYDROcodone -acetaminophen  (NORCO/VICODIN) 5-325 MG tablet, Take 1 tablet by mouth every 8 (eight) hours as needed., Disp: 15 tablet, Rfl: 0    hydrOXYzine  (ATARAX ) 25 MG tablet, Take 1 tablet (25 mg total) by mouth at bedtime as needed., Disp: 120 tablet, Rfl: 0   ibuprofen  (ADVIL ) 800 MG tablet, Take one every 8 hours with food as needed., Disp: 90 tablet, Rfl: 0   lisinopril -hydrochlorothiazide  (ZESTORETIC ) 20-25 MG tablet, TAKE 1 TABLET BY MOUTH DAILY, Disp: 30 tablet, Rfl: 0   methocarbamol  (ROBAXIN ) 750 MG tablet, Take 1 tablet (750 mg total) by mouth 4 (four) times daily., Disp: 90 tablet, Rfl: 1   sildenafil  (VIAGRA ) 100 MG tablet, TAKE 1/2 TO 1 TABLET(50 TO 100 MG) BY MOUTH DAILY AS NEEDED FOR ERECTILE DYSFUNCTION, Disp: 5 tablet, Rfl: 11   allopurinol  (ZYLOPRIM ) 100 MG tablet, Take 1 tablet (100 mg total) by mouth daily. Future refills MUST COME FROM PATIENT'S PCP., Disp: 90 tablet, Rfl: 1   amLODipine  (NORVASC ) 10 MG tablet, Take 1 tablet (10 mg total) by mouth daily., Disp: 90 tablet, Rfl: 3   colchicine  0.6 MG tablet, Take 1 tablet (0.6 mg total) by mouth 2 (two) times daily. (Patient not taking: Reported on 12/28/2023), Disp: 60 tablet, Rfl: 2   Objective:     BP (!) 162/90 (BP Location: Right Arm, Patient Position: Sitting, Cuff Size: Large)   Pulse 100   Temp (!) 96.8 F (36 C) (Temporal)   Ht 6' (1.829 m)   Wt 219 lb 6.4 oz (99.5 kg)   SpO2 96%  BMI 29.76 kg/m  BP Readings from Last 3 Encounters:  12/28/23 (!) 162/90  09/23/23 138/86  08/04/23 130/88   Wt Readings from Last 3 Encounters:  12/28/23 219 lb 6.4 oz (99.5 kg)  09/23/23 215 lb 12.8 oz (97.9 kg)  08/04/23 223 lb (101.2 kg)      Physical Exam Constitutional:      General: He is not in acute distress.    Appearance: Normal appearance. He is not ill-appearing, toxic-appearing or diaphoretic.  HENT:     Head: Normocephalic and atraumatic.     Right Ear: External ear normal.     Left Ear: External ear normal.  Eyes:     General: No scleral icterus.       Right eye: No discharge.        Left eye: No discharge.     Extraocular Movements:  Extraocular movements intact.     Conjunctiva/sclera: Conjunctivae normal.  Pulmonary:     Effort: Pulmonary effort is normal. No respiratory distress.  Skin:    General: Skin is warm and dry.  Neurological:     Mental Status: He is alert and oriented to person, place, and time.  Psychiatric:        Mood and Affect: Mood normal.        Behavior: Behavior normal.      No results found for any visits on 12/28/23.  {Labs (Optional):23779}  The 10-year ASCVD risk score (Arnett DK, et al., 2019) is: 27.9%    Assessment & Plan:   White coat syndrome with diagnosis of hypertension -     Comprehensive metabolic panel with GFR -     amLODIPine  Besylate; Take 1 tablet (10 mg total) by mouth daily.  Dispense: 90 tablet; Refill: 3  Prediabetes -     Comprehensive metabolic panel with GFR -     Hemoglobin A1c  Abnormal TSH -     T3, free -     T4, free  Immunization due -     Flu vaccine HIGH DOSE PF(Fluzone Trivalent) -     Pneumococcal conjugate vaccine 20-valent  History of gout -     Comprehensive metabolic panel with GFR -     Uric acid -     Allopurinol ; Take 1 tablet (100 mg total) by mouth daily. Future refills MUST COME FROM PATIENT'S PCP.  Dispense: 90 tablet; Refill: 1    Return in about 3 months (around 03/28/2024) for chronic disease follow-up.  Will check and record blood pressures and follow-up in 3 months.  Believe the patient does have whitecoat hypertension.   Elsie Sim Lent, MD

## 2023-12-30 ENCOUNTER — Ambulatory Visit: Payer: Self-pay | Admitting: Family Medicine

## 2024-03-27 ENCOUNTER — Other Ambulatory Visit: Payer: Self-pay | Admitting: Family Medicine

## 2024-03-27 DIAGNOSIS — Z8739 Personal history of other diseases of the musculoskeletal system and connective tissue: Secondary | ICD-10-CM

## 2024-03-28 ENCOUNTER — Encounter: Payer: Self-pay | Admitting: Family Medicine

## 2024-03-28 ENCOUNTER — Ambulatory Visit (INDEPENDENT_AMBULATORY_CARE_PROVIDER_SITE_OTHER): Admitting: Family Medicine

## 2024-03-28 VITALS — BP 164/84 | HR 103 | Temp 97.7°F | Ht 72.0 in | Wt 224.4 lb

## 2024-03-28 DIAGNOSIS — Z8739 Personal history of other diseases of the musculoskeletal system and connective tissue: Secondary | ICD-10-CM | POA: Diagnosis not present

## 2024-03-28 DIAGNOSIS — Z13220 Encounter for screening for lipoid disorders: Secondary | ICD-10-CM

## 2024-03-28 DIAGNOSIS — I10 Essential (primary) hypertension: Secondary | ICD-10-CM

## 2024-03-28 DIAGNOSIS — E876 Hypokalemia: Secondary | ICD-10-CM | POA: Diagnosis not present

## 2024-03-28 DIAGNOSIS — R7303 Prediabetes: Secondary | ICD-10-CM | POA: Diagnosis not present

## 2024-03-28 LAB — BASIC METABOLIC PANEL WITH GFR
BUN: 13 mg/dL (ref 6–23)
CO2: 29 meq/L (ref 19–32)
Calcium: 9.6 mg/dL (ref 8.4–10.5)
Chloride: 101 meq/L (ref 96–112)
Creatinine, Ser: 0.9 mg/dL (ref 0.40–1.50)
GFR: 85.35 mL/min
Glucose, Bld: 145 mg/dL — ABNORMAL HIGH (ref 70–99)
Potassium: 3.5 meq/L (ref 3.5–5.1)
Sodium: 137 meq/L (ref 135–145)

## 2024-03-28 LAB — LDL CHOLESTEROL, DIRECT: Direct LDL: 90 mg/dL

## 2024-03-28 LAB — HEMOGLOBIN A1C: Hgb A1c MFr Bld: 5.8 % (ref 4.6–6.5)

## 2024-03-28 NOTE — Progress Notes (Signed)
 "  Established Patient Office Visit   Subjective:  Patient ID: Jason Valencia, male    DOB: 05/27/1950  Age: 73 y.o. MRN: [id number]  Chief Complaint  Patient presents with   Medical Management of Chronic Issues    3 mon f/u     HPI Encounter Diagnoses  Name Primary?   White coat syndrome with diagnosis of hypertension Yes   Prediabetes    Screening for cholesterol level    Hypokalemia    History of gout    Follow-up of above.  Blood pressure at home has been running consistently in the 130s over 70s on current medications which include Zestoretic  20/25 and amlodipine  10 mg both daily..  He brought his cuff into the clinic for calibration last visit.  He is compliant with all of his medications.  He enjoys green leafy vegetables.  He is mindful of his intake of simple carbohydrates and is working out regularly at the gym for diet-controlled prediabetes.  Uric acid is well-controlled with the 100 mg allopurinol    Review of Systems  Constitutional: Negative.   HENT: Negative.    Eyes:  Negative for blurred vision, discharge and redness.  Respiratory: Negative.    Cardiovascular: Negative.   Gastrointestinal:  Negative for abdominal pain.  Genitourinary: Negative.   Musculoskeletal: Negative.  Negative for myalgias.  Skin:  Negative for rash.  Neurological:  Negative for tingling, loss of consciousness and weakness.  Endo/Heme/Allergies:  Negative for polydipsia.    Current Medications[1]   Objective:     BP (!) 164/84   Pulse (!) 103   Temp 97.7 F (36.5 C)   Ht 6' (1.829 m)   Wt 224 lb 6.4 oz (101.8 kg)   SpO2 100%   BMI 30.43 kg/m    Physical Exam Constitutional:      General: He is not in acute distress.    Appearance: Normal appearance. He is not ill-appearing, toxic-appearing or diaphoretic.  HENT:     Head: Normocephalic and atraumatic.     Right Ear: External ear normal.     Left Ear: External ear normal.  Eyes:     General: No scleral icterus.        Right eye: No discharge.        Left eye: No discharge.     Extraocular Movements: Extraocular movements intact.     Conjunctiva/sclera: Conjunctivae normal.  Pulmonary:     Effort: Pulmonary effort is normal. No respiratory distress.  Skin:    General: Skin is warm and dry.  Neurological:     Mental Status: He is alert and oriented to person, place, and time.  Psychiatric:        Mood and Affect: Mood normal.        Behavior: Behavior normal.      No results found for any visits on 03/28/24.    The 10-year ASCVD risk score (Arnett DK, et al., 2019) is: 28.4%    Assessment & Plan:   White coat syndrome with diagnosis of hypertension -     Basic metabolic panel with GFR  Prediabetes -     Basic metabolic panel with GFR -     Hemoglobin A1c  Screening for cholesterol level -     LDL cholesterol, direct  Hypokalemia -     Basic metabolic panel with GFR  History of gout    Return in about 6 months (around 09/26/2024).  Continue all current medicines.  Continue healthy active lifestyle.  Elsie  Sim Lent, MD     [1]  Current Outpatient Medications:    allopurinol  (ZYLOPRIM ) 100 MG tablet, TAKE 1 TABLET BY MOUTH DAILY., Disp: 90 tablet, Rfl: 1   amLODipine  (NORVASC ) 10 MG tablet, Take 1 tablet (10 mg total) by mouth daily., Disp: 90 tablet, Rfl: 3   ascorbic acid (VITAMIN C) 100 MG tablet, Take 1 tablet by mouth daily., Disp: , Rfl:    Cholecalciferol (VITAMIN D -1000 MAX ST) 25 MCG (1000 UT) tablet, Take by mouth., Disp: , Rfl:    Coenzyme Q10 50 MG CAPS, Take 1 capsule by mouth daily., Disp: , Rfl:    colchicine  0.6 MG tablet, Take 1 tablet (0.6 mg total) by mouth 2 (two) times daily., Disp: 60 tablet, Rfl: 2   cyanocobalamin 100 MCG tablet, Take 1 tablet by mouth daily., Disp: , Rfl:    cyclobenzaprine  (FLEXERIL ) 10 MG tablet, Take 1 tablet (10 mg total) by mouth 3 (three) times daily as needed., Disp: 90 tablet, Rfl: 1   HYDROcodone -acetaminophen   (NORCO/VICODIN) 5-325 MG tablet, Take 1 tablet by mouth every 8 (eight) hours as needed., Disp: 15 tablet, Rfl: 0   hydrOXYzine  (ATARAX ) 25 MG tablet, Take 1 tablet (25 mg total) by mouth at bedtime as needed., Disp: 120 tablet, Rfl: 0   ibuprofen  (ADVIL ) 800 MG tablet, Take one every 8 hours with food as needed., Disp: 90 tablet, Rfl: 0   lisinopril -hydrochlorothiazide  (ZESTORETIC ) 20-25 MG tablet, TAKE 1 TABLET BY MOUTH DAILY, Disp: 30 tablet, Rfl: 0   methocarbamol  (ROBAXIN ) 750 MG tablet, Take 1 tablet (750 mg total) by mouth 4 (four) times daily., Disp: 90 tablet, Rfl: 1   sildenafil  (VIAGRA ) 100 MG tablet, TAKE 1/2 TO 1 TABLET(50 TO 100 MG) BY MOUTH DAILY AS NEEDED FOR ERECTILE DYSFUNCTION, Disp: 5 tablet, Rfl: 11  "

## 2024-03-31 ENCOUNTER — Ambulatory Visit

## 2024-03-31 ENCOUNTER — Ambulatory Visit: Payer: Self-pay | Admitting: Family Medicine

## 2024-04-10 NOTE — Progress Notes (Signed)
Patient called.  Left message for patient to call back.

## 2024-05-16 ENCOUNTER — Ambulatory Visit

## 2024-09-26 ENCOUNTER — Ambulatory Visit: Admitting: Family Medicine
